# Patient Record
Sex: Female | Born: 1960
Health system: Southern US, Community
[De-identification: ages and names within clinical notes are randomized; demographics above are authoritative.]

## PROBLEM LIST (undated history)

## (undated) DIAGNOSIS — K760 Fatty (change of) liver, not elsewhere classified: Secondary | ICD-10-CM

## (undated) DIAGNOSIS — E669 Obesity, unspecified: Secondary | ICD-10-CM

## (undated) DIAGNOSIS — Z659 Problem related to unspecified psychosocial circumstances: Secondary | ICD-10-CM

## (undated) DIAGNOSIS — D72829 Elevated white blood cell count, unspecified: Secondary | ICD-10-CM

## (undated) DIAGNOSIS — I480 Paroxysmal atrial fibrillation: Secondary | ICD-10-CM

## (undated) DIAGNOSIS — J45909 Unspecified asthma, uncomplicated: Secondary | ICD-10-CM

## (undated) DIAGNOSIS — E876 Hypokalemia: Secondary | ICD-10-CM

## (undated) DIAGNOSIS — M1990 Unspecified osteoarthritis, unspecified site: Secondary | ICD-10-CM

## (undated) DIAGNOSIS — I10 Essential (primary) hypertension: Secondary | ICD-10-CM

## (undated) DIAGNOSIS — D573 Sickle-cell trait: Secondary | ICD-10-CM

## (undated) HISTORY — DX: Obesity, unspecified: E66.9

## (undated) HISTORY — DX: Sickle-cell trait: D57.3

## (undated) HISTORY — DX: Problem related to unspecified psychosocial circumstances: Z65.9

## (undated) HISTORY — DX: Unspecified asthma, uncomplicated: J45.909

## (undated) HISTORY — DX: Elevated white blood cell count, unspecified: D72.829

## (undated) HISTORY — PX: HERNIA REPAIR: SHX51

## (undated) HISTORY — DX: Paroxysmal atrial fibrillation: I48.0

---

## 2001-11-04 ENCOUNTER — Emergency Department (HOSPITAL_COMMUNITY): Admission: EM | Admit: 2001-11-04 | Discharge: 2001-11-04 | Payer: Self-pay | Admitting: Emergency Medicine

## 2002-04-06 ENCOUNTER — Emergency Department (HOSPITAL_COMMUNITY): Admission: EM | Admit: 2002-04-06 | Discharge: 2002-04-06 | Payer: Self-pay | Admitting: Emergency Medicine

## 2003-01-25 ENCOUNTER — Emergency Department (HOSPITAL_COMMUNITY): Admission: EM | Admit: 2003-01-25 | Discharge: 2003-01-25 | Payer: Self-pay | Admitting: Emergency Medicine

## 2003-01-25 ENCOUNTER — Encounter: Payer: Self-pay | Admitting: Emergency Medicine

## 2003-03-22 ENCOUNTER — Emergency Department (HOSPITAL_COMMUNITY): Admission: EM | Admit: 2003-03-22 | Discharge: 2003-03-23 | Payer: Self-pay | Admitting: Emergency Medicine

## 2003-03-23 ENCOUNTER — Encounter: Payer: Self-pay | Admitting: Emergency Medicine

## 2003-04-15 ENCOUNTER — Ambulatory Visit (HOSPITAL_COMMUNITY): Admission: RE | Admit: 2003-04-15 | Discharge: 2003-04-15 | Payer: Self-pay | Admitting: Family Medicine

## 2003-04-15 ENCOUNTER — Encounter: Payer: Self-pay | Admitting: Family Medicine

## 2003-05-04 ENCOUNTER — Emergency Department (HOSPITAL_COMMUNITY): Admission: EM | Admit: 2003-05-04 | Discharge: 2003-05-04 | Payer: Self-pay | Admitting: Emergency Medicine

## 2003-06-12 ENCOUNTER — Emergency Department (HOSPITAL_COMMUNITY): Admission: EM | Admit: 2003-06-12 | Discharge: 2003-06-13 | Payer: Self-pay | Admitting: Emergency Medicine

## 2003-06-12 ENCOUNTER — Emergency Department (HOSPITAL_COMMUNITY): Admission: EM | Admit: 2003-06-12 | Discharge: 2003-06-12 | Payer: Self-pay | Admitting: Emergency Medicine

## 2003-06-13 ENCOUNTER — Encounter: Payer: Self-pay | Admitting: Emergency Medicine

## 2004-10-01 ENCOUNTER — Emergency Department (HOSPITAL_COMMUNITY): Admission: EM | Admit: 2004-10-01 | Discharge: 2004-10-02 | Payer: Self-pay | Admitting: Emergency Medicine

## 2004-12-04 ENCOUNTER — Ambulatory Visit: Payer: Self-pay | Admitting: Family Medicine

## 2005-04-17 ENCOUNTER — Ambulatory Visit: Payer: Self-pay | Admitting: Internal Medicine

## 2005-04-19 ENCOUNTER — Emergency Department (HOSPITAL_COMMUNITY): Admission: EM | Admit: 2005-04-19 | Discharge: 2005-04-19 | Payer: Self-pay | Admitting: *Deleted

## 2005-05-05 ENCOUNTER — Emergency Department (HOSPITAL_COMMUNITY): Admission: EM | Admit: 2005-05-05 | Discharge: 2005-05-05 | Payer: Self-pay | Admitting: Emergency Medicine

## 2005-05-07 ENCOUNTER — Ambulatory Visit: Payer: Self-pay | Admitting: Family Medicine

## 2005-07-07 ENCOUNTER — Emergency Department (HOSPITAL_COMMUNITY): Admission: EM | Admit: 2005-07-07 | Discharge: 2005-07-07 | Payer: Self-pay | Admitting: Emergency Medicine

## 2005-09-12 ENCOUNTER — Emergency Department (HOSPITAL_COMMUNITY): Admission: EM | Admit: 2005-09-12 | Discharge: 2005-09-13 | Payer: Self-pay | Admitting: Emergency Medicine

## 2006-01-08 ENCOUNTER — Emergency Department (HOSPITAL_COMMUNITY): Admission: EM | Admit: 2006-01-08 | Discharge: 2006-01-09 | Payer: Self-pay | Admitting: Emergency Medicine

## 2006-05-02 ENCOUNTER — Emergency Department (HOSPITAL_COMMUNITY): Admission: EM | Admit: 2006-05-02 | Discharge: 2006-05-02 | Payer: Self-pay | Admitting: Emergency Medicine

## 2006-11-25 ENCOUNTER — Emergency Department (HOSPITAL_COMMUNITY): Admission: EM | Admit: 2006-11-25 | Discharge: 2006-11-25 | Payer: Self-pay | Admitting: Emergency Medicine

## 2007-01-23 ENCOUNTER — Emergency Department (HOSPITAL_COMMUNITY): Admission: EM | Admit: 2007-01-23 | Discharge: 2007-01-23 | Payer: Self-pay | Admitting: Emergency Medicine

## 2007-01-24 ENCOUNTER — Ambulatory Visit: Payer: Self-pay | Admitting: Cardiology

## 2007-01-30 ENCOUNTER — Encounter: Payer: Self-pay | Admitting: Cardiology

## 2007-03-27 ENCOUNTER — Emergency Department (HOSPITAL_COMMUNITY): Admission: EM | Admit: 2007-03-27 | Discharge: 2007-03-27 | Payer: Self-pay | Admitting: Emergency Medicine

## 2007-04-19 ENCOUNTER — Emergency Department (HOSPITAL_COMMUNITY): Admission: EM | Admit: 2007-04-19 | Discharge: 2007-04-19 | Payer: Self-pay | Admitting: Emergency Medicine

## 2007-06-12 ENCOUNTER — Ambulatory Visit: Payer: Self-pay | Admitting: Cardiology

## 2007-06-12 ENCOUNTER — Encounter: Payer: Self-pay | Admitting: Cardiology

## 2007-06-25 ENCOUNTER — Ambulatory Visit: Payer: Self-pay | Admitting: Cardiology

## 2007-07-17 ENCOUNTER — Ambulatory Visit (HOSPITAL_COMMUNITY): Admission: RE | Admit: 2007-07-17 | Discharge: 2007-07-18 | Payer: Self-pay | Admitting: General Surgery

## 2007-07-17 ENCOUNTER — Encounter (HOSPITAL_BASED_OUTPATIENT_CLINIC_OR_DEPARTMENT_OTHER): Payer: Self-pay | Admitting: General Surgery

## 2007-07-18 ENCOUNTER — Emergency Department (HOSPITAL_COMMUNITY): Admission: EM | Admit: 2007-07-18 | Discharge: 2007-07-18 | Payer: Self-pay | Admitting: Emergency Medicine

## 2008-02-18 ENCOUNTER — Emergency Department (HOSPITAL_COMMUNITY): Admission: EM | Admit: 2008-02-18 | Discharge: 2008-02-18 | Payer: Self-pay | Admitting: Emergency Medicine

## 2009-11-02 ENCOUNTER — Emergency Department (HOSPITAL_COMMUNITY): Admission: EM | Admit: 2009-11-02 | Discharge: 2009-11-02 | Payer: Self-pay | Admitting: Emergency Medicine

## 2010-03-09 ENCOUNTER — Emergency Department (HOSPITAL_COMMUNITY): Admission: EM | Admit: 2010-03-09 | Discharge: 2010-03-09 | Payer: Self-pay | Admitting: Family Medicine

## 2010-07-21 ENCOUNTER — Emergency Department (HOSPITAL_COMMUNITY): Admission: EM | Admit: 2010-07-21 | Discharge: 2010-07-21 | Payer: Self-pay | Admitting: Emergency Medicine

## 2010-07-31 ENCOUNTER — Emergency Department (HOSPITAL_COMMUNITY)
Admission: EM | Admit: 2010-07-31 | Discharge: 2010-07-31 | Payer: Self-pay | Source: Home / Self Care | Admitting: Emergency Medicine

## 2010-09-22 ENCOUNTER — Emergency Department (HOSPITAL_COMMUNITY)
Admission: EM | Admit: 2010-09-22 | Discharge: 2010-09-22 | Payer: Self-pay | Source: Home / Self Care | Admitting: Emergency Medicine

## 2010-09-22 LAB — URINALYSIS, ROUTINE W REFLEX MICROSCOPIC
Bilirubin Urine: NEGATIVE
Specific Gravity, Urine: 1.01 (ref 1.005–1.030)
Urine Glucose, Fasting: NEGATIVE mg/dL
Urobilinogen, UA: 0.2 mg/dL (ref 0.0–1.0)
pH: 5.5 (ref 5.0–8.0)

## 2010-09-22 LAB — URINE MICROSCOPIC-ADD ON

## 2010-09-22 LAB — WET PREP, GENITAL: Yeast Wet Prep HPF POC: NONE SEEN

## 2010-09-23 LAB — GC/CHLAMYDIA PROBE AMP, GENITAL
Chlamydia, DNA Probe: NEGATIVE
GC Probe Amp, Genital: NEGATIVE

## 2010-09-24 LAB — URINE CULTURE: Culture: NO GROWTH

## 2010-11-06 LAB — DIFFERENTIAL
Basophils Absolute: 0 10*3/uL (ref 0.0–0.1)
Basophils Relative: 0 % (ref 0–1)
Eosinophils Relative: 2 % (ref 0–5)
Lymphocytes Relative: 27 % (ref 12–46)

## 2010-11-06 LAB — CBC
HCT: 36.9 % (ref 36.0–46.0)
Platelets: 331 10*3/uL (ref 150–400)
WBC: 11.8 10*3/uL — ABNORMAL HIGH (ref 4.0–10.5)

## 2010-11-06 LAB — POCT I-STAT, CHEM 8
Chloride: 106 mEq/L (ref 96–112)
Glucose, Bld: 122 mg/dL — ABNORMAL HIGH (ref 70–99)
Sodium: 142 mEq/L (ref 135–145)

## 2010-11-07 LAB — BASIC METABOLIC PANEL
BUN: 8 mg/dL (ref 6–23)
CO2: 25 mEq/L (ref 19–32)
Calcium: 8.5 mg/dL (ref 8.4–10.5)
Creatinine, Ser: 0.68 mg/dL (ref 0.4–1.2)
GFR calc non Af Amer: >60 mL/min (ref >60)

## 2010-11-07 LAB — DIFFERENTIAL
Basophils Absolute: 0 10*3/uL (ref 0.0–0.1)
Basophils Relative: 0 % (ref 0–1)
Lymphs Abs: 3.2 10*3/uL (ref 0.7–4.0)
Monocytes Absolute: 0.7 10*3/uL (ref 0.1–1.0)
Monocytes Relative: 6 % (ref 3–12)

## 2010-11-07 LAB — POCT I-STAT, CHEM 8
BUN: 9 mg/dL (ref 6–23)
Creatinine, Ser: 0.7 mg/dL (ref 0.4–1.2)
Glucose, Bld: 162 mg/dL — ABNORMAL HIGH (ref 70–99)
Potassium: 3.8 mEq/L (ref 3.5–5.1)
Sodium: 140 mEq/L (ref 135–145)

## 2010-11-07 LAB — CBC
Hemoglobin: 12.4 g/dL (ref 12.0–15.0)
MCH: 28.6 pg (ref 26.0–34.0)
MCV: 84.3 fL (ref 78.0–100.0)
RDW: 13.3 % (ref 11.5–15.5)
WBC: 11 10*3/uL — ABNORMAL HIGH (ref 4.0–10.5)

## 2010-11-12 LAB — POCT URINALYSIS DIP (DEVICE)
Bilirubin Urine: NEGATIVE
Glucose, UA: NEGATIVE mg/dL
Hgb urine dipstick: NEGATIVE
Specific Gravity, Urine: 1.015 (ref 1.005–1.030)
pH: 5.5 (ref 5.0–8.0)

## 2010-11-12 LAB — POCT I-STAT, CHEM 8
BUN: 7 mg/dL (ref 6–23)
Calcium, Ion: 1.17 mmol/L (ref 1.12–1.32)
Chloride: 106 mEq/L (ref 96–112)
Creatinine, Ser: 0.7 mg/dL (ref 0.4–1.2)
Glucose, Bld: 135 mg/dL — ABNORMAL HIGH (ref 70–99)
Potassium: 3.9 mEq/L (ref 3.5–5.1)

## 2010-11-19 LAB — COMPREHENSIVE METABOLIC PANEL
ALT: 15 U/L (ref 0–35)
BUN: 6 mg/dL (ref 6–23)
CO2: 22 mEq/L (ref 19–32)
Calcium: 8.7 mg/dL (ref 8.4–10.5)
Creatinine, Ser: 0.62 mg/dL (ref 0.4–1.2)
GFR calc non Af Amer: >60 mL/min (ref >60)
Glucose, Bld: 95 mg/dL (ref 70–99)
Sodium: 139 mEq/L (ref 135–145)

## 2010-11-19 LAB — CBC
HCT: 39 % (ref 36.0–46.0)
Hemoglobin: 13.2 g/dL (ref 12.0–15.0)
MCHC: 33.9 g/dL (ref 30.0–36.0)
MCV: 90.2 fL (ref 78.0–100.0)
RBC: 4.33 MIL/uL (ref 3.87–5.11)

## 2010-11-19 LAB — POCT CARDIAC MARKERS
CKMB, poc: <1 ng/mL — ABNORMAL LOW (ref 1.0–8.0)
Troponin i, poc: <0.05 ng/mL (ref 0.00–0.09)

## 2010-11-19 LAB — URINALYSIS, ROUTINE W REFLEX MICROSCOPIC
Glucose, UA: NEGATIVE mg/dL
Protein, ur: NEGATIVE mg/dL
Urobilinogen, UA: 1 mg/dL (ref 0.0–1.0)

## 2010-11-19 LAB — DIFFERENTIAL
Eosinophils Absolute: 0.2 10*3/uL (ref 0.0–0.7)
Lymphs Abs: 3.3 10*3/uL (ref 0.7–4.0)
Neutro Abs: 8.1 10*3/uL — ABNORMAL HIGH (ref 1.7–7.7)
Neutrophils Relative %: 65 % (ref 43–77)

## 2010-11-19 LAB — URINE CULTURE

## 2010-11-19 LAB — URINE MICROSCOPIC-ADD ON

## 2010-12-06 ENCOUNTER — Emergency Department (HOSPITAL_COMMUNITY)
Admission: EM | Admit: 2010-12-06 | Discharge: 2010-12-07 | Disposition: A | Discharge status: Home / Self Care | Payer: Self-pay | Attending: Emergency Medicine | Admitting: Emergency Medicine

## 2010-12-06 DIAGNOSIS — K219 Gastro-esophageal reflux disease without esophagitis: Secondary | ICD-10-CM | POA: Insufficient documentation

## 2010-12-06 DIAGNOSIS — R42 Dizziness and giddiness: Secondary | ICD-10-CM | POA: Insufficient documentation

## 2010-12-06 DIAGNOSIS — E119 Type 2 diabetes mellitus without complications: Secondary | ICD-10-CM | POA: Insufficient documentation

## 2010-12-06 DIAGNOSIS — G40909 Epilepsy, unspecified, not intractable, without status epilepticus: Secondary | ICD-10-CM | POA: Insufficient documentation

## 2010-12-06 DIAGNOSIS — R079 Chest pain, unspecified: Secondary | ICD-10-CM | POA: Insufficient documentation

## 2010-12-06 LAB — CBC
MCH: 28.9 pg (ref 26.0–34.0)
MCV: 82.9 fL (ref 78.0–100.0)
Platelets: 365 10*3/uL (ref 150–400)
RBC: 4.57 MIL/uL (ref 3.87–5.11)
RDW: 13.5 % (ref 11.5–15.5)

## 2010-12-06 LAB — POCT I-STAT, CHEM 8
BUN: 7 mg/dL (ref 6–23)
Calcium, Ion: 1.07 mmol/L — ABNORMAL LOW (ref 1.12–1.32)
Chloride: 104 mEq/L (ref 96–112)
Creatinine, Ser: 0.6 mg/dL (ref 0.4–1.2)
Glucose, Bld: 174 mg/dL — ABNORMAL HIGH (ref 70–99)

## 2010-12-06 LAB — DIFFERENTIAL
Eosinophils Absolute: 0.3 10*3/uL (ref 0.0–0.7)
Eosinophils Relative: 3 % (ref 0–5)
Lymphs Abs: 3.8 10*3/uL (ref 0.7–4.0)
Monocytes Relative: 6 % (ref 3–12)

## 2010-12-07 LAB — URINALYSIS, ROUTINE W REFLEX MICROSCOPIC
Leukocytes, UA: NEGATIVE
Specific Gravity, Urine: 1.011 (ref 1.005–1.030)
Urobilinogen, UA: 1 mg/dL (ref 0.0–1.0)

## 2010-12-07 LAB — POCT CARDIAC MARKERS: Troponin i, poc: <0.05 ng/mL (ref 0.00–0.09)

## 2010-12-07 LAB — URINE MICROSCOPIC-ADD ON

## 2011-01-09 NOTE — Assessment & Plan Note (Signed)
[facility]                            CARDIOLOGY OFFICE NOTE   [name], [name]                      MRN:          [id number]  DATE:01/24/2007                            DOB:          Jun 19, 1961    REFERRING PHYSICIAN:  Leonie Man, M.D.   I was asked by Dr. Dominga Ferry to evaluate and consult on Anna Golden  for a history of atrial flutter and presurgical clearance for a  ventral hernia repair.   HISTORY OF PRESENT ILLNESS:  [name] is 50 years of age, married and  has 3 children. She moved here from [place] about 6 years ago. She  has been seen off and on in the emergency room there and here for some  palpitations. They usually occur spontaneously when she is sitting or  lying down. She does note that her heart speeds up when she walks or  exerts herself.   She has never been told she has had atrial flutter or fib. She has never  been told she has SVT.   She states she was seen in the emergency room at [facility] this year  but I could not find it. She was seen there twice for unrelated issues.   She gets a little light headed when she has the palpitations. She has  not had a fainting spell. She denies any ischemic symptoms.   PAST MEDICAL HISTORY:  She is a new onset diabetic.   MEDICATIONS:  1. Metformin 750 mg a day.  2. Omeprazole 20 mg a day.  3. Arthrotec 75 mg p.r.n.  4. Albuterol inhaler p.r.n. for chronic bronchitis and tobacco use.  5. Flagyl 500 mg a day at present.   She smokes about a half pack a cigarettes and has for 21 years. She is  trying to quit. She does not use recreational drugs. She does not use  alcohol. She drinks no caffeine.   PAST SURGICAL HISTORY:  Negative.   FAMILY HISTORY:  Negative for premature coronary disease. There is a  history of hypertension and diabetes.   SOCIAL HISTORY:  She is unemployed and married and has 3 children.   REVIEW OF SYSTEMS:  She has chronic allergies  and she had childhood  asthma. She has  chronic bronchitis now from cigarette use. She has  sickle cell trait and some chronic anemia. She has occasional  constipation. She is tired all the time. She has a ventral hernia that  needs repair by Dr. Lurene Shadow. She has gastroesophageal reflux. She has  chronic arthritis. She has suffered from depression in the past.   The rest of the review of systems are negative.   PHYSICAL EXAMINATION:  VITAL SIGNS:  Her blood pressure is 133/84, pulse  was 102 in sinus tach. She came down to 80 once she settled down.  VITAL SIGNS:  Her height is 5 foot 3.5, she weighs 234 pounds.  HEENT:  Normocephalic, atraumatic. PERRLA. Extraocular movements intact.  Sclera muddy. Facial symmetry is normal. Dentition satisfactory.  NECK:  Supple. Carotids are full without bruits. There is no JVD.  Thyroid  is nonpalpable.  LUNGS:  Clear except for expiratory rhonchi.  HEART:  PMI is poorly appreciated. She has pendulous breasts. She has  normal S1 and S2 without gallop.  ABDOMEN:  Protuberant with good bowel sounds. There is no obvious  organomegaly but it was difficult to assess with her weight. There was  no midline bruit.  EXTREMITIES:  Reveal no edema. Pulses are brisk.  NEUROLOGIC:  Intact.  SKIN:  Intact.   EKG is normal.   ASSESSMENT:  1. History of tachy palpitations with a sensation fluttering. I do not      feel she has atrial flutter by history.  2. Obesity with new onset diabetes.  3. Tobacco use.   I had a long talk with [name] by therapeutic lifestyle changes  including weight reduction and discontinued tobacco. I told her the  albuterol could exacerbate her palpitations. Will check a 2-D  echocardiogram to rule out any structural heart disease. If this is  normal, we will clear her for surgery. I will see her back on a p.r.n.  basis.     Thomas C. Daleen Squibb, MD, [facility]  Electronically Signed    TCW/MedQ  DD: 01/24/2007  DT: 01/24/2007  Job  #: [id number]   cc:   Lorelle Formosa, M.D.  Leonie Man, M.D.

## 2011-01-09 NOTE — Op Note (Signed)
[name], [name]               ACCOUNT NO.:  [id number]   MEDICAL RECORD NO.:  [id number]          PATIENT TYPE:  OIB   LOCATION:  1525                         FACILITY:  [facility]   PHYSICIAN:  Leonie Man, M.D.   DATE OF BIRTH:  August 09, 1961   DATE OF PROCEDURE:  07/17/2007  DATE OF DISCHARGE:                               OPERATIVE REPORT   PREOPERATIVE DIAGNOSIS:  Incarcerated ventral hernia.   POSTOPERATIVE DIAGNOSIS:  Incarcerated ventral hernia.   PROCEDURE:  Repair of incarcerated ventral hernia with mesh (Kugel  patch).   SURGEON:  Leonie Man, M.D.   ASSISTANT:  OR tech.   ANESTHESIA:  General.   FINDINGS AT OPERATION:  Incarcerated ventral hernia containing omentum.   SPECIMEN:  Hernia sac with omentum.   ESTIMATED BLOOD LOSS:  Minimal.   COMPLICATIONS:  None.   The patient returned to the PACU in excellent condition.   Mrs. Anna Golden is a 50 year old woman status post cesarean section  and has a large incarcerated ventral hernia in the infraumbilical region  extending almost all the way down to the pubis.  This has been causing  her some discomfort although it does not contain any bowel, but it seems  to contain omentum.  The patient comes to the operating room now after  the risks and potential benefits of surgery have been fully discussed,  all questions answered and consent obtained.   PROCEDURE:  The patient was positioned supinely and following the  induction of satisfactory general anesthesia, the abdomen is prepped and  draped to be included in a sterile operative field.  An infraumbilical  incision is carried down through skin and subcutaneous tissues in the  midline and deepened to the hernia.  The hernia sac is dissected free  from the surrounding soft tissues and all sides with dissection carried  down to the fascia.  The hernia sac is then opened carefully and  released from the fascia, and the omentum that his incarcerated within  the  hernia sac is divided with the LigaSure, and the hernia sac and  omentum are removed and forwarded for pathologic evaluation.  The edges  of the hernia sac are then freed from adhesions in a wide circle so as  to facilitate the placement of a Kugel inlay patch.  The patch is placed  in and secured with multiple #1 Novofil sutures to the fascia.  This was  accomplished only after some undermining of the fascia could be carried  out so as to place the sutures widely around the Kugel patch.  This  having been accomplished, the hernia repair, the fascia over the Kugel  patch was then closed with interrupted sutures of #1 Novofil.  Subcutaneous tissues were then thoroughly irrigated and then closed  with interrupted 2-0 Vicryl sutures.  The skin was closed with a running  4-0 Monocryl suture and reinforced with Steri-Strips, sterile dressings  applied, the anesthetic reversed and the patient removed from the  operating room to the recovery room in stable condition.  She tolerated  the procedure well.      Leonie Man,  M.D.  Electronically Signed     PB/MEDQ  D:  07/17/2007  T:  07/17/2007  Job:  [id number]   cc:   [facility]

## 2011-02-14 ENCOUNTER — Ambulatory Visit (INDEPENDENT_AMBULATORY_CARE_PROVIDER_SITE_OTHER): Payer: Self-pay

## 2011-02-14 ENCOUNTER — Inpatient Hospital Stay (INDEPENDENT_AMBULATORY_CARE_PROVIDER_SITE_OTHER)
Admission: RE | Admit: 2011-02-14 | Discharge: 2011-02-14 | Disposition: A | Discharge status: Home / Self Care | Payer: Self-pay | Source: Ambulatory Visit | Attending: Emergency Medicine | Admitting: Emergency Medicine

## 2011-02-14 DIAGNOSIS — R05 Cough: Secondary | ICD-10-CM

## 2011-02-14 DIAGNOSIS — R071 Chest pain on breathing: Secondary | ICD-10-CM

## 2011-02-14 DIAGNOSIS — Z760 Encounter for issue of repeat prescription: Secondary | ICD-10-CM

## 2011-02-17 ENCOUNTER — Emergency Department (HOSPITAL_COMMUNITY)
Admission: EM | Admit: 2011-02-17 | Discharge: 2011-02-17 | Disposition: A | Discharge status: Home / Self Care | Payer: Self-pay | Attending: Emergency Medicine | Admitting: Emergency Medicine

## 2011-02-17 ENCOUNTER — Emergency Department (HOSPITAL_COMMUNITY): Payer: Self-pay

## 2011-02-17 DIAGNOSIS — G40909 Epilepsy, unspecified, not intractable, without status epilepticus: Secondary | ICD-10-CM | POA: Insufficient documentation

## 2011-02-17 DIAGNOSIS — K219 Gastro-esophageal reflux disease without esophagitis: Secondary | ICD-10-CM | POA: Insufficient documentation

## 2011-02-17 DIAGNOSIS — R079 Chest pain, unspecified: Secondary | ICD-10-CM | POA: Insufficient documentation

## 2011-02-17 DIAGNOSIS — R002 Palpitations: Secondary | ICD-10-CM | POA: Insufficient documentation

## 2011-02-17 DIAGNOSIS — M069 Rheumatoid arthritis, unspecified: Secondary | ICD-10-CM | POA: Insufficient documentation

## 2011-02-17 DIAGNOSIS — E119 Type 2 diabetes mellitus without complications: Secondary | ICD-10-CM | POA: Insufficient documentation

## 2011-02-17 DIAGNOSIS — M25519 Pain in unspecified shoulder: Secondary | ICD-10-CM | POA: Insufficient documentation

## 2011-02-17 DIAGNOSIS — M129 Arthropathy, unspecified: Secondary | ICD-10-CM | POA: Insufficient documentation

## 2011-02-17 DIAGNOSIS — M546 Pain in thoracic spine: Secondary | ICD-10-CM | POA: Insufficient documentation

## 2011-02-17 DIAGNOSIS — E669 Obesity, unspecified: Secondary | ICD-10-CM | POA: Insufficient documentation

## 2011-02-17 DIAGNOSIS — Z79899 Other long term (current) drug therapy: Secondary | ICD-10-CM | POA: Insufficient documentation

## 2011-02-17 DIAGNOSIS — R000 Tachycardia, unspecified: Secondary | ICD-10-CM | POA: Insufficient documentation

## 2011-02-17 LAB — POCT I-STAT, CHEM 8
BUN: 7 mg/dL (ref 6–23)
Chloride: 104 mEq/L (ref 96–112)
Creatinine, Ser: 0.8 mg/dL (ref 0.50–1.10)
Glucose, Bld: 173 mg/dL — ABNORMAL HIGH (ref 70–99)
Hemoglobin: 12.2 g/dL (ref 12.0–15.0)
Potassium: 3.4 mEq/L — ABNORMAL LOW (ref 3.5–5.1)
Sodium: 142 mEq/L (ref 135–145)

## 2011-02-17 LAB — CBC
HCT: 34.4 % — ABNORMAL LOW (ref 36.0–46.0)
Hemoglobin: 12.1 g/dL (ref 12.0–15.0)
MCHC: 35.2 g/dL (ref 30.0–36.0)
MCV: 83.1 fL (ref 78.0–100.0)
RDW: 13.5 % (ref 11.5–15.5)

## 2011-02-17 LAB — DIFFERENTIAL
Basophils Absolute: 0 10*3/uL (ref 0.0–0.1)
Eosinophils Relative: 2 % (ref 0–5)
Lymphocytes Relative: 33 % (ref 12–46)
Lymphs Abs: 4.5 10*3/uL — ABNORMAL HIGH (ref 0.7–4.0)
Monocytes Absolute: 0.8 10*3/uL (ref 0.1–1.0)
Neutro Abs: 8.1 10*3/uL — ABNORMAL HIGH (ref 1.7–7.7)

## 2011-02-17 LAB — CK TOTAL AND CKMB (NOT AT ARMC)
CK, MB: 1.3 ng/mL (ref 0.3–4.0)
Relative Index: INVALID (ref 0.0–2.5)

## 2011-03-24 ENCOUNTER — Emergency Department (HOSPITAL_COMMUNITY): Payer: Self-pay

## 2011-03-24 ENCOUNTER — Emergency Department (HOSPITAL_COMMUNITY)
Admission: EM | Admit: 2011-03-24 | Discharge: 2011-03-24 | Disposition: A | Discharge status: Home / Self Care | Payer: Self-pay | Attending: Emergency Medicine | Admitting: Emergency Medicine

## 2011-03-24 DIAGNOSIS — R0789 Other chest pain: Secondary | ICD-10-CM | POA: Insufficient documentation

## 2011-03-24 DIAGNOSIS — R0602 Shortness of breath: Secondary | ICD-10-CM | POA: Insufficient documentation

## 2011-03-24 DIAGNOSIS — G40909 Epilepsy, unspecified, not intractable, without status epilepticus: Secondary | ICD-10-CM | POA: Insufficient documentation

## 2011-03-24 DIAGNOSIS — E876 Hypokalemia: Secondary | ICD-10-CM | POA: Insufficient documentation

## 2011-03-24 DIAGNOSIS — M069 Rheumatoid arthritis, unspecified: Secondary | ICD-10-CM | POA: Insufficient documentation

## 2011-03-24 DIAGNOSIS — E119 Type 2 diabetes mellitus without complications: Secondary | ICD-10-CM | POA: Insufficient documentation

## 2011-03-24 DIAGNOSIS — K219 Gastro-esophageal reflux disease without esophagitis: Secondary | ICD-10-CM | POA: Insufficient documentation

## 2011-03-24 DIAGNOSIS — F411 Generalized anxiety disorder: Secondary | ICD-10-CM | POA: Insufficient documentation

## 2011-03-24 DIAGNOSIS — I4891 Unspecified atrial fibrillation: Secondary | ICD-10-CM | POA: Insufficient documentation

## 2011-03-24 DIAGNOSIS — R0609 Other forms of dyspnea: Secondary | ICD-10-CM | POA: Insufficient documentation

## 2011-03-24 DIAGNOSIS — R0989 Other specified symptoms and signs involving the circulatory and respiratory systems: Secondary | ICD-10-CM | POA: Insufficient documentation

## 2011-03-24 DIAGNOSIS — R000 Tachycardia, unspecified: Secondary | ICD-10-CM | POA: Insufficient documentation

## 2011-03-24 LAB — DIFFERENTIAL
Basophils Absolute: 0 10*3/uL (ref 0.0–0.1)
Basophils Relative: 0 % (ref 0–1)
Lymphocytes Relative: 20 % (ref 12–46)
Monocytes Relative: 5 % (ref 3–12)
Neutro Abs: 8.5 10*3/uL — ABNORMAL HIGH (ref 1.7–7.7)
Neutrophils Relative %: 74 % (ref 43–77)

## 2011-03-24 LAB — BASIC METABOLIC PANEL
Calcium: 9.2 mg/dL (ref 8.4–10.5)
Creatinine, Ser: 0.77 mg/dL (ref 0.50–1.10)
GFR calc Af Amer: >60 mL/min (ref >60)
GFR calc non Af Amer: >60 mL/min (ref >60)
Sodium: 139 mEq/L (ref 135–145)

## 2011-03-24 LAB — PRO B NATRIURETIC PEPTIDE: Pro B Natriuretic peptide (BNP): 10.8 pg/mL (ref 0–125)

## 2011-03-24 LAB — CK TOTAL AND CKMB (NOT AT ARMC)
CK, MB: 2 ng/mL (ref 0.3–4.0)
Total CK: 118 U/L (ref 7–177)

## 2011-03-24 LAB — CBC
HCT: 36.9 % (ref 36.0–46.0)
Hemoglobin: 12.9 g/dL (ref 12.0–15.0)
RBC: 4.48 MIL/uL (ref 3.87–5.11)

## 2011-03-24 LAB — TROPONIN I: Troponin I: <0.3 ng/mL (ref <0.30)

## 2011-05-24 LAB — WET PREP, GENITAL
Clue Cells Wet Prep HPF POC: NONE SEEN
Yeast Wet Prep HPF POC: NONE SEEN

## 2011-05-24 LAB — URINALYSIS, ROUTINE W REFLEX MICROSCOPIC
Glucose, UA: NEGATIVE
Ketones, ur: NEGATIVE
Nitrite: NEGATIVE
Protein, ur: NEGATIVE
Urobilinogen, UA: 1

## 2011-05-24 LAB — URINE MICROSCOPIC-ADD ON

## 2011-05-24 LAB — GC/CHLAMYDIA PROBE AMP, GENITAL: GC Probe Amp, Genital: NEGATIVE

## 2011-06-05 LAB — BASIC METABOLIC PANEL
BUN: 5 — ABNORMAL LOW
Calcium: 8.4
Chloride: 105
Creatinine, Ser: 0.7
GFR calc Af Amer: >60
GFR calc non Af Amer: >60

## 2011-06-05 LAB — URINALYSIS, ROUTINE W REFLEX MICROSCOPIC
Bilirubin Urine: NEGATIVE
Bilirubin Urine: NEGATIVE
Glucose, UA: NEGATIVE
Glucose, UA: NEGATIVE
Hgb urine dipstick: NEGATIVE
Ketones, ur: NEGATIVE
Nitrite: NEGATIVE
Nitrite: NEGATIVE
Protein, ur: NEGATIVE
Protein, ur: NEGATIVE
Specific Gravity, Urine: 1.014
Urobilinogen, UA: 1
pH: 6

## 2011-06-05 LAB — DIFFERENTIAL
Basophils Absolute: 0
Basophils Relative: 0
Eosinophils Absolute: 0.2
Eosinophils Absolute: 0.2
Lymphocytes Relative: 17
Lymphs Abs: 2.2
Monocytes Relative: 5
Neutro Abs: 9.7 — ABNORMAL HIGH
Neutro Abs: 8.1 — ABNORMAL HIGH
Neutrophils Relative %: 76
Neutrophils Relative %: 66

## 2011-06-05 LAB — PROTIME-INR
INR: 1
Prothrombin Time: 13.6

## 2011-06-05 LAB — PREGNANCY, URINE: Preg Test, Ur: NEGATIVE

## 2011-06-05 LAB — CBC
MCHC: 33.9
MCV: 89.5
Platelets: 246
Platelets: 258
RBC: 3.97
WBC: 12.7 — ABNORMAL HIGH
WBC: 12.3 — ABNORMAL HIGH

## 2011-06-05 LAB — POCT PREGNANCY, URINE: Preg Test, Ur: NEGATIVE

## 2011-06-05 LAB — COMPREHENSIVE METABOLIC PANEL
Alkaline Phosphatase: 23 — ABNORMAL LOW
BUN: 6
GFR calc Af Amer: >60
GFR calc non Af Amer: >60
Sodium: 141
Total Bilirubin: 0.8
Total Protein: 7.4

## 2011-06-05 LAB — URINE MICROSCOPIC-ADD ON

## 2011-06-05 LAB — APTT: aPTT: 29

## 2011-06-05 LAB — LIPASE, BLOOD: Lipase: 11

## 2011-06-08 LAB — URINALYSIS, ROUTINE W REFLEX MICROSCOPIC
Glucose, UA: NEGATIVE
Hgb urine dipstick: NEGATIVE
Protein, ur: NEGATIVE
pH: 6.5

## 2011-06-08 LAB — DIFFERENTIAL
Basophils Relative: 0
Eosinophils Absolute: 0.3
Lymphs Abs: 3.3
Monocytes Absolute: 0.3
Monocytes Relative: 3
Neutro Abs: 8.2 — ABNORMAL HIGH
Neutrophils Relative %: 68

## 2011-06-08 LAB — COMPREHENSIVE METABOLIC PANEL
ALT: 15
Albumin: 3.1 — ABNORMAL LOW
Alkaline Phosphatase: 26 — ABNORMAL LOW
Calcium: 8.7
Potassium: 3.6
Sodium: 141
Total Protein: 6.6

## 2011-06-08 LAB — CBC
MCHC: 33.7
Platelets: 294
RDW: 14.2 — ABNORMAL HIGH

## 2011-06-11 LAB — DIFFERENTIAL
Eosinophils Relative: 2
Lymphocytes Relative: 24
Lymphs Abs: 2.9
Monocytes Relative: 4

## 2011-06-11 LAB — CBC
HCT: 41.5
Hemoglobin: 13.7
MCV: 88.2
Platelets: 352
RBC: 4.71
WBC: 12.3 — ABNORMAL HIGH

## 2011-06-11 LAB — BASIC METABOLIC PANEL
BUN: 3 — ABNORMAL LOW
Chloride: 111
GFR calc Af Amer: >60
GFR calc non Af Amer: >60
Potassium: 3.7
Sodium: 141

## 2011-06-11 LAB — URINALYSIS, ROUTINE W REFLEX MICROSCOPIC
Glucose, UA: NEGATIVE
Nitrite: NEGATIVE
Specific Gravity, Urine: 1.014
pH: 5.5

## 2011-10-02 ENCOUNTER — Encounter (HOSPITAL_COMMUNITY): Payer: Self-pay

## 2011-10-02 ENCOUNTER — Other Ambulatory Visit: Payer: Self-pay

## 2011-10-02 ENCOUNTER — Emergency Department (HOSPITAL_COMMUNITY)
Admission: EM | Admit: 2011-10-02 | Discharge: 2011-10-03 | Disposition: A | Discharge status: Home / Self Care | Payer: Self-pay | Attending: Emergency Medicine | Admitting: Emergency Medicine

## 2011-10-02 ENCOUNTER — Emergency Department (HOSPITAL_COMMUNITY): Payer: Self-pay

## 2011-10-02 DIAGNOSIS — M25559 Pain in unspecified hip: Secondary | ICD-10-CM | POA: Insufficient documentation

## 2011-10-02 DIAGNOSIS — M25519 Pain in unspecified shoulder: Secondary | ICD-10-CM | POA: Insufficient documentation

## 2011-10-02 DIAGNOSIS — J189 Pneumonia, unspecified organism: Secondary | ICD-10-CM

## 2011-10-02 DIAGNOSIS — R079 Chest pain, unspecified: Secondary | ICD-10-CM | POA: Insufficient documentation

## 2011-10-02 DIAGNOSIS — M1990 Unspecified osteoarthritis, unspecified site: Secondary | ICD-10-CM | POA: Insufficient documentation

## 2011-10-02 DIAGNOSIS — F17200 Nicotine dependence, unspecified, uncomplicated: Secondary | ICD-10-CM | POA: Insufficient documentation

## 2011-10-02 LAB — COMPREHENSIVE METABOLIC PANEL
ALT: 18 U/L (ref 0–35)
AST: 38 U/L — ABNORMAL HIGH (ref 0–37)
Alkaline Phosphatase: 36 U/L — ABNORMAL LOW (ref 39–117)
CO2: 27 mEq/L (ref 19–32)
Calcium: 10.3 mg/dL (ref 8.4–10.5)
Chloride: 103 mEq/L (ref 96–112)
GFR calc Af Amer: >90 mL/min (ref >90)
GFR calc non Af Amer: >90 mL/min (ref >90)
Glucose, Bld: 233 mg/dL — ABNORMAL HIGH (ref 70–99)
Potassium: 3.5 mEq/L (ref 3.5–5.1)
Sodium: 137 mEq/L (ref 135–145)

## 2011-10-02 LAB — URINALYSIS, ROUTINE W REFLEX MICROSCOPIC
Bilirubin Urine: NEGATIVE
Hgb urine dipstick: NEGATIVE
Ketones, ur: NEGATIVE mg/dL
Nitrite: NEGATIVE
Specific Gravity, Urine: 1.012 (ref 1.005–1.030)
Urobilinogen, UA: 0.2 mg/dL (ref 0.0–1.0)

## 2011-10-02 LAB — DIFFERENTIAL
Basophils Absolute: 0.1 10*3/uL (ref 0.0–0.1)
Lymphocytes Relative: 32 % (ref 12–46)
Lymphs Abs: 4.5 10*3/uL — ABNORMAL HIGH (ref 0.7–4.0)
Neutro Abs: 8.3 10*3/uL — ABNORMAL HIGH (ref 1.7–7.7)
Neutrophils Relative %: 60 % (ref 43–77)

## 2011-10-02 LAB — APTT: aPTT: 26 seconds (ref 24–37)

## 2011-10-02 LAB — PROTIME-INR: Prothrombin Time: 13.7 seconds (ref 11.6–15.2)

## 2011-10-02 LAB — CARDIAC PANEL(CRET KIN+CKTOT+MB+TROPI)
CK, MB: 1.5 ng/mL (ref 0.3–4.0)
Troponin I: <0.3 ng/mL (ref <0.30)

## 2011-10-02 LAB — CBC
MCV: 83.8 fL (ref 78.0–100.0)
Platelets: 339 10*3/uL (ref 150–400)
RBC: 4.37 MIL/uL (ref 3.87–5.11)
RDW: 13.7 % (ref 11.5–15.5)
WBC: 14 10*3/uL — ABNORMAL HIGH (ref 4.0–10.5)

## 2011-10-02 LAB — MAGNESIUM: Magnesium: 1.8 mg/dL (ref 1.5–2.5)

## 2011-10-02 LAB — D-DIMER, QUANTITATIVE: D-Dimer, Quant: 0.49 ug/mL-FEU — ABNORMAL HIGH (ref 0.00–0.48)

## 2011-10-02 MED ORDER — MOXIFLOXACIN HCL IN NACL 400 MG/250ML IV SOLN
400.0000 mg | Freq: Once | INTRAVENOUS | Status: AC
  Administered 2011-10-02: 400 mg via INTRAVENOUS
  Filled 2011-10-02: qty 250

## 2011-10-02 MED ORDER — IOHEXOL 350 MG/ML SOLN
100.0000 mL | Freq: Once | INTRAVENOUS | Status: AC | PRN
  Administered 2011-10-02: 100 mL via INTRAVENOUS

## 2011-10-02 MED ORDER — SODIUM CHLORIDE 0.9 % IV BOLUS (SEPSIS)
500.0000 mL | Freq: Once | INTRAVENOUS | Status: AC
  Administered 2011-10-02: 500 mL via INTRAVENOUS

## 2011-10-02 NOTE — ED Notes (Signed)
Pt. Given sandwich bag and beverage.

## 2011-10-02 NOTE — ED Notes (Signed)
Encouraged pt to void.

## 2011-10-02 NOTE — ED Notes (Signed)
Pt. In room resting comfortably, c/o occasional fluttering in chest.

## 2011-10-02 NOTE — ED Provider Notes (Signed)
History     CSN: [id number]  Arrival date & time 10/02/11  1608   First MD Initiated Contact with Patient 10/02/11 1821      Chief Complaint  Patient presents with  . Chest Pain    (Consider location/radiation/quality/duration/timing/severity/associated sxs/prior treatment) Patient is a 51 y.o. female presenting with chest pain. The history is provided by the patient.  Chest Pain The chest pain began 1 - 2 weeks ago. Chest pain occurs intermittently. The chest pain is resolved. The quality of the pain is described as sharp. Pertinent negatives for primary symptoms include no fever, no shortness of breath, no cough, no wheezing, no palpitations, no abdominal pain, no nausea, no vomiting and no dizziness.  Associated symptoms include numbness.  Pertinent negatives for associated symptoms include no weakness.   Pt has history of poorly controlled DM. Has been out of meds for some time. Does not have PCP. She c/o L shoulder and L chest pain worsening over last week. Worse when pt ranges her L shoulder. Has been diagnosed with osteoarthritis in the past. Also states she has L hip pain with ROM. No trauma. She has bl hand numbness present for extended period. She is currently having no chest pain, SOB, fever, chill, weakness.   History reviewed. No pertinent past medical history.  History reviewed. No pertinent past surgical history.  No family history on file.  History  Substance Use Topics  . Smoking status: Current Everyday Smoker  . Smokeless tobacco: Not on file  . Alcohol Use: No    OB History    Grav Para Term Preterm Abortions TAB SAB Ect Mult Living                  Review of Systems  Constitutional: Negative for fever and chills.  HENT: Negative for neck pain.   Respiratory: Negative for cough, shortness of breath and wheezing.   Cardiovascular: Positive for chest pain. Negative for palpitations and leg swelling.  Gastrointestinal: Negative for nausea, vomiting and  abdominal pain.  Musculoskeletal: Positive for arthralgias.  Skin: Negative for color change, pallor and rash.  Neurological: Positive for numbness. Negative for dizziness, weakness and light-headedness.    Allergies  Review of patient's allergies indicates no known allergies.  Home Medications   Current Outpatient Rx  Name Route Sig Dispense Refill  . ACETAMINOPHEN ER 650 MG PO TBCR Oral Take 650-1,300 mg by mouth 2 (two) times daily.     . AZITHROMYCIN 250 MG PO TABS Oral Take 1 tablet (250 mg total) by mouth daily. Take first 2 tablets together, then 1 every day until finished. 6 tablet 0  . TRAMADOL HCL 50 MG PO TABS Oral Take 1 tablet (50 mg total) by mouth every 6 (six) hours as needed for pain. 15 tablet 0    BP 128/82  Pulse 99  Temp(Src) 99 F (37.2 C) (Oral)  Resp 20  SpO2 97%  LMP 08/29/2011  Physical Exam  Nursing note and vitals reviewed. Constitutional: She is oriented to person, place, and time. She appears well-developed and well-nourished. No distress.  HENT:  Head: Normocephalic and atraumatic.  Mouth/Throat: Oropharynx is clear and moist.  Eyes: EOM are normal. Pupils are equal, round, and reactive to light.  Neck: Normal range of motion. Neck supple.  Cardiovascular: Normal rate and regular rhythm.   Pulmonary/Chest: Effort normal and breath sounds normal. No respiratory distress. She has no wheezes. She has no rales.  Abdominal: Soft. Bowel sounds are normal. She exhibits  no distension. There is no tenderness. There is no rebound and no guarding.  Musculoskeletal: She exhibits no edema and no tenderness.       Pt with reproducible pain with ROM of L shoulder and L hip. No deformity or swelling.   Neurological: She is alert and oriented to person, place, and time.       5/5 motor, decreased sensation bl hands.   Skin: Skin is warm and dry. No rash noted. No erythema.  Psychiatric: She has a normal mood and affect. Her behavior is normal.    ED Course    Procedures (including critical care time)  Labs Reviewed  CBC - Abnormal; Notable for the following:    WBC 14.0 (*)    All other components within normal limits  DIFFERENTIAL - Abnormal; Notable for the following:    Neutro Abs 8.3 (*)    Lymphs Abs 4.5 (*)    All other components within normal limits  COMPREHENSIVE METABOLIC PANEL - Abnormal; Notable for the following:    Glucose, Bld 233 (*)    Albumin 3.1 (*)    AST 38 (*)    Alkaline Phosphatase 36 (*)    Total Bilirubin 0.2 (*)    All other components within normal limits  URINALYSIS, ROUTINE W REFLEX MICROSCOPIC - Abnormal; Notable for the following:    APPearance CLOUDY (*)    All other components within normal limits  D-DIMER, QUANTITATIVE - Abnormal; Notable for the following:    D-Dimer, Quant 0.49 (*)    All other components within normal limits  CARDIAC PANEL(CRET KIN+CKTOT+MB+TROPI)  MAGNESIUM  PROTIME-INR  APTT  POCT PREGNANCY, URINE  POCT I-STAT TROPONIN I  CULTURE, BLOOD (ROUTINE X 2)  CULTURE, BLOOD (ROUTINE X 2)   Dg Chest 2 View  10/02/2011  *RADIOLOGY REPORT*  Clinical Data: Chest pain  CHEST - 2 VIEW  Comparison: 03/24/2011  Findings: Spurring in the lower thoracic spine. Lungs clear.  Heart size and pulmonary vascularity normal.  No effusion.  IMPRESSION: No acute disease  Original Report Authenticated By: Osa Craver, M.D.   Ct Angio Chest W/cm &/or Wo Cm  10/02/2011  *RADIOLOGY REPORT*  Clinical Data: Chest pain  CT ANGIOGRAPHY CHEST  Technique:  Multidetector CT imaging of the chest using the standard protocol during bolus administration of intravenous contrast. Multiplanar reconstructed images including MIPs were obtained and reviewed to evaluate the vascular anatomy.  Contrast:  Comparison: 10/02/2011 radiograph, 10/01/2004 CT  Findings: The no pulmonary arterial branch filling defect identified.  Normal heart size.  Normal caliber aorta.  No pleural or pericardial effusion.  No intrathoracic  lymphadenopathy.  Limited images through the upper abdomen show no acute abnormality.  Central airways are patent.  Bilateral patchy ground-glass opacities, upper lobe predominant.  No pneumothorax.  Multilevel degenerative changes of the imaged spine. No acute or aggressive appearing osseous lesion.  IMPRESSION: Bilateral upper lobe patchy ground-glass opacities, favored to represent infection or edema.  No PE identified.  Original Report Authenticated By: Waneta Martins, M.D.     1. Pneumonia       MDM   Atypical Pneumonia likely cause for pt's symptoms. Repeat trop neg x 2. Ct neg for PE. Return for concerns       Loren Racer, MD 10/03/11 [phone]

## 2011-10-02 NOTE — ED Notes (Signed)
Resting quietly in bed. Resp even and unlabored. Awaiting CT. NAD noted

## 2011-10-02 NOTE — ED Notes (Signed)
One week ago. Pt. Having lt. Side chest pain that radiates into her lt. Arm  Having fatique when walking up the stairs.  Pt. Also having dizziness and sob and diahreea.  Pt is in NAD

## 2011-10-03 MED ORDER — AZITHROMYCIN 250 MG PO TABS: 250.0000 mg | ORAL_TABLET | Freq: Every day | ORAL | Status: DC

## 2011-10-03 MED ORDER — TRAMADOL HCL 50 MG PO TABS: 50.0000 mg | ORAL_TABLET | Freq: Four times a day (QID) | ORAL | Status: DC | PRN

## 2011-10-03 NOTE — ED Provider Notes (Signed)
Flow manager notify me if patient having a blood culture with gram-positive cocci in clusters. Review of patient's records from yesterday indicate that she presented with chest pain without cough. She had negative urinalysis and chest x-ray for signs of infection. Patient did have a white count of 14. Result is most likely a contaminant. However flow manager will contact patient and recommend that she return within 24 hours for recheck of her CBC. She is to return sooner if she has worsening symptoms, fever, or hyperglycemia.  Cyndra Numbers, MD 10/03/11 [phone]

## 2011-10-04 ENCOUNTER — Emergency Department (HOSPITAL_COMMUNITY)
Admission: EM | Admit: 2011-10-04 | Discharge: 2011-10-04 | Disposition: A | Discharge status: Home / Self Care | Payer: Self-pay | Attending: Emergency Medicine | Admitting: Emergency Medicine

## 2011-10-04 ENCOUNTER — Encounter (HOSPITAL_COMMUNITY): Payer: Self-pay | Admitting: Emergency Medicine

## 2011-10-04 DIAGNOSIS — J189 Pneumonia, unspecified organism: Secondary | ICD-10-CM

## 2011-10-04 DIAGNOSIS — I2510 Atherosclerotic heart disease of native coronary artery without angina pectoris: Secondary | ICD-10-CM | POA: Insufficient documentation

## 2011-10-04 DIAGNOSIS — E119 Type 2 diabetes mellitus without complications: Secondary | ICD-10-CM | POA: Insufficient documentation

## 2011-10-04 DIAGNOSIS — R079 Chest pain, unspecified: Secondary | ICD-10-CM | POA: Insufficient documentation

## 2011-10-04 DIAGNOSIS — Z79899 Other long term (current) drug therapy: Secondary | ICD-10-CM | POA: Insufficient documentation

## 2011-10-04 DIAGNOSIS — I10 Essential (primary) hypertension: Secondary | ICD-10-CM | POA: Insufficient documentation

## 2011-10-04 DIAGNOSIS — F17200 Nicotine dependence, unspecified, uncomplicated: Secondary | ICD-10-CM | POA: Insufficient documentation

## 2011-10-04 HISTORY — DX: Essential (primary) hypertension: I10

## 2011-10-04 LAB — CBC
Hemoglobin: 13.1 g/dL (ref 12.0–15.0)
MCH: 29.8 pg (ref 26.0–34.0)
MCHC: 35.4 g/dL (ref 30.0–36.0)
MCV: 84.1 fL (ref 78.0–100.0)
RBC: 4.4 MIL/uL (ref 3.87–5.11)

## 2011-10-04 LAB — DIFFERENTIAL
Basophils Absolute: 0 10*3/uL (ref 0.0–0.1)
Lymphs Abs: 3.7 10*3/uL (ref 0.7–4.0)
Monocytes Absolute: 0.7 10*3/uL (ref 0.1–1.0)
Monocytes Relative: 6 % (ref 3–12)

## 2011-10-04 LAB — LACTIC ACID, PLASMA: Lactic Acid, Venous: 2 mmol/L (ref 0.5–2.2)

## 2011-10-04 LAB — POCT I-STAT, CHEM 8
Chloride: 102 mEq/L (ref 96–112)
HCT: 40 % (ref 36.0–46.0)
Hemoglobin: 13.6 g/dL (ref 12.0–15.0)
Potassium: 3.6 mEq/L (ref 3.5–5.1)
Sodium: 140 mEq/L (ref 135–145)

## 2011-10-04 MED ORDER — DOXYCYCLINE HYCLATE 100 MG PO CAPS: 100.0000 mg | ORAL_CAPSULE | Freq: Two times a day (BID) | ORAL | Status: AC

## 2011-10-04 MED ORDER — METFORMIN HCL 500 MG PO TABS: 500.0000 mg | ORAL_TABLET | Freq: Two times a day (BID) | ORAL | Status: DC

## 2011-10-04 NOTE — ED Notes (Signed)
+   Blood. Left hand. Bottle drawn aerobic and anaerobic 5cc each. Gram + cocci in clusters. Reached CBC in 24 hours. If febrile or feeling worse (i.e. Hyperglycemia) come back sooner. Result may be just bacteria from your skin. However, we want to be careful. 10/03/11 11:28 pm Patient called. States she will return tomorrow afternoon for recheck of CBC. Per Joyce Gross RN PFM

## 2011-10-04 NOTE — ED Provider Notes (Signed)
History     CSN: [id number]  Arrival date & time 10/04/11  1528   First MD Initiated Contact with Patient 10/04/11 1611      Chief Complaint  Patient presents with  . Abnormal Lab    (Consider location/radiation/quality/duration/timing/severity/associated sxs/prior treatment) The history is provided by the patient and medical records. No language interpreter was used.   Patient seen on 10/02/11 and treated for atypical pneumonia.  Blood culture obtained on that visit was positive for gram pos cocci in clusters in one sample.  Patient was instructed to return to ED for re-evaluation.  Patients previous symptoms have improved since initial visit. Past Medical History  Diagnosis Date  . Diabetes mellitus   . Hypertension   . Coronary artery disease     History reviewed. No pertinent past surgical history.  History reviewed. No pertinent family history.  History  Substance Use Topics  . Smoking status: Current Everyday Smoker  . Smokeless tobacco: Not on file  . Alcohol Use: No    OB History    Grav Para Term Preterm Abortions TAB SAB Ect Mult Living                  Review of Systems  All other systems reviewed and are negative.    Allergies  Review of patient's allergies indicates no known allergies.  Home Medications   Current Outpatient Rx  Name Route Sig Dispense Refill  . ACETAMINOPHEN ER 650 MG PO TBCR Oral Take 650-1,300 mg by mouth 2 (two) times daily as needed. For pain.    Marland Kitchen AZITHROMYCIN 250 MG PO TABS Oral Take 250-500 mg by mouth daily. 2 tablets the first day, then 1 tablet for 4 days    . TRAMADOL HCL 50 MG PO TABS Oral Take 50 mg by mouth every 6 (six) hours as needed. For pain.      BP 111/77  Pulse 91  Temp(Src) 98 F (36.7 C) (Oral)  Resp 20  SpO2 99%  LMP 08/29/2011  Physical Exam  Nursing note and vitals reviewed. Constitutional: She is oriented to person, place, and time. She appears well-developed and well-nourished.  HENT:  Head:  Normocephalic.  Eyes: Pupils are equal, round, and reactive to light.  Neck: Normal range of motion. Neck supple.  Cardiovascular: Normal rate, regular rhythm, normal heart sounds and intact distal pulses.   Pulmonary/Chest: Effort normal and breath sounds normal.  Abdominal: Soft. Bowel sounds are normal.  Musculoskeletal: Normal range of motion.  Neurological: She is alert and oriented to person, place, and time.  Skin: Skin is warm and dry.  Psychiatric: She has a normal mood and affect.    ED Course  Procedures (including critical care time)  Labs Reviewed  CBC - Abnormal; Notable for the following:    WBC 12.1 (*)    All other components within normal limits  POCT I-STAT, CHEM 8 - Abnormal; Notable for the following:    BUN 5 (*)    Creatinine, Ser 0.40 (*)    Glucose, Bld 244 (*)    All other components within normal limits  DIFFERENTIAL  LACTIC ACID, PLASMA   Dg Chest 2 View  10/02/2011  *RADIOLOGY REPORT*  Clinical Data: Chest pain  CHEST - 2 VIEW  Comparison: 03/24/2011  Findings: Spurring in the lower thoracic spine. Lungs clear.  Heart size and pulmonary vascularity normal.  No effusion.  IMPRESSION: No acute disease  Original Report Authenticated By: Osa Craver, M.D.   Ct  Angio Chest W/cm &/or Wo Cm  10/02/2011  *RADIOLOGY REPORT*  Clinical Data: Chest pain  CT ANGIOGRAPHY CHEST  Technique:  Multidetector CT imaging of the chest using the standard protocol during bolus administration of intravenous contrast. Multiplanar reconstructed images including MIPs were obtained and reviewed to evaluate the vascular anatomy.  Contrast:  Comparison: 10/02/2011 radiograph, 10/01/2004 CT  Findings: The no pulmonary arterial branch filling defect identified.  Normal heart size.  Normal caliber aorta.  No pleural or pericardial effusion.  No intrathoracic lymphadenopathy.  Limited images through the upper abdomen show no acute abnormality.  Central airways are patent.  Bilateral  patchy ground-glass opacities, upper lobe predominant.  No pneumothorax.  Multilevel degenerative changes of the imaged spine. No acute or aggressive appearing osseous lesion.  IMPRESSION: Bilateral upper lobe patchy ground-glass opacities, favored to represent infection or edema.  No PE identified.  Original Report Authenticated By: Waneta Martins, M.D.     No diagnosis found.  6:52 PM Spoke with Dr. Ninetta Lights with infectious disease regarding patient and lab results.  He also feels like blood culture result is d/t contaminant.  As far as antibiotic for the atypical pneumonia, he suggests doxcycline as a cost-effective alternative to azithromycin (patient has not filled prior prescription for abx).  MDM          Jimmye Norman, NP 10/04/11 1900  Jimmye Norman, NP 10/04/11 1905

## 2011-10-04 NOTE — ED Notes (Signed)
Pt here for recheck of CBC and other labs; pt had gram + cocci in blood cultures

## 2011-10-05 LAB — CULTURE, BLOOD (ROUTINE X 2): Culture  Setup Time: 201302060338

## 2011-10-05 NOTE — ED Provider Notes (Signed)
Medical screening examination/treatment/procedure(s) were performed by non-physician practitioner and as supervising physician I was immediately available for consultation/collaboration.    Elinda Bunten L Dessa Ledee, MD 10/05/11 0856 

## 2011-10-09 LAB — CULTURE, BLOOD (ROUTINE X 2): Culture  Setup Time: 201302060338

## 2011-11-24 IMAGING — CR DG HIP COMPLETE 2+V*R*
3 series · 3 of 3 positions shown · non-contrast
Comparison: Lumbar spine radiographs 11/02/2009 and CT abdomen
pelvis 11/25/2006

CLINICAL DATA: Severe right hip pain.

RIGHT HIP - COMPLETE 2+ VIEW

[t pelvis a.p.]
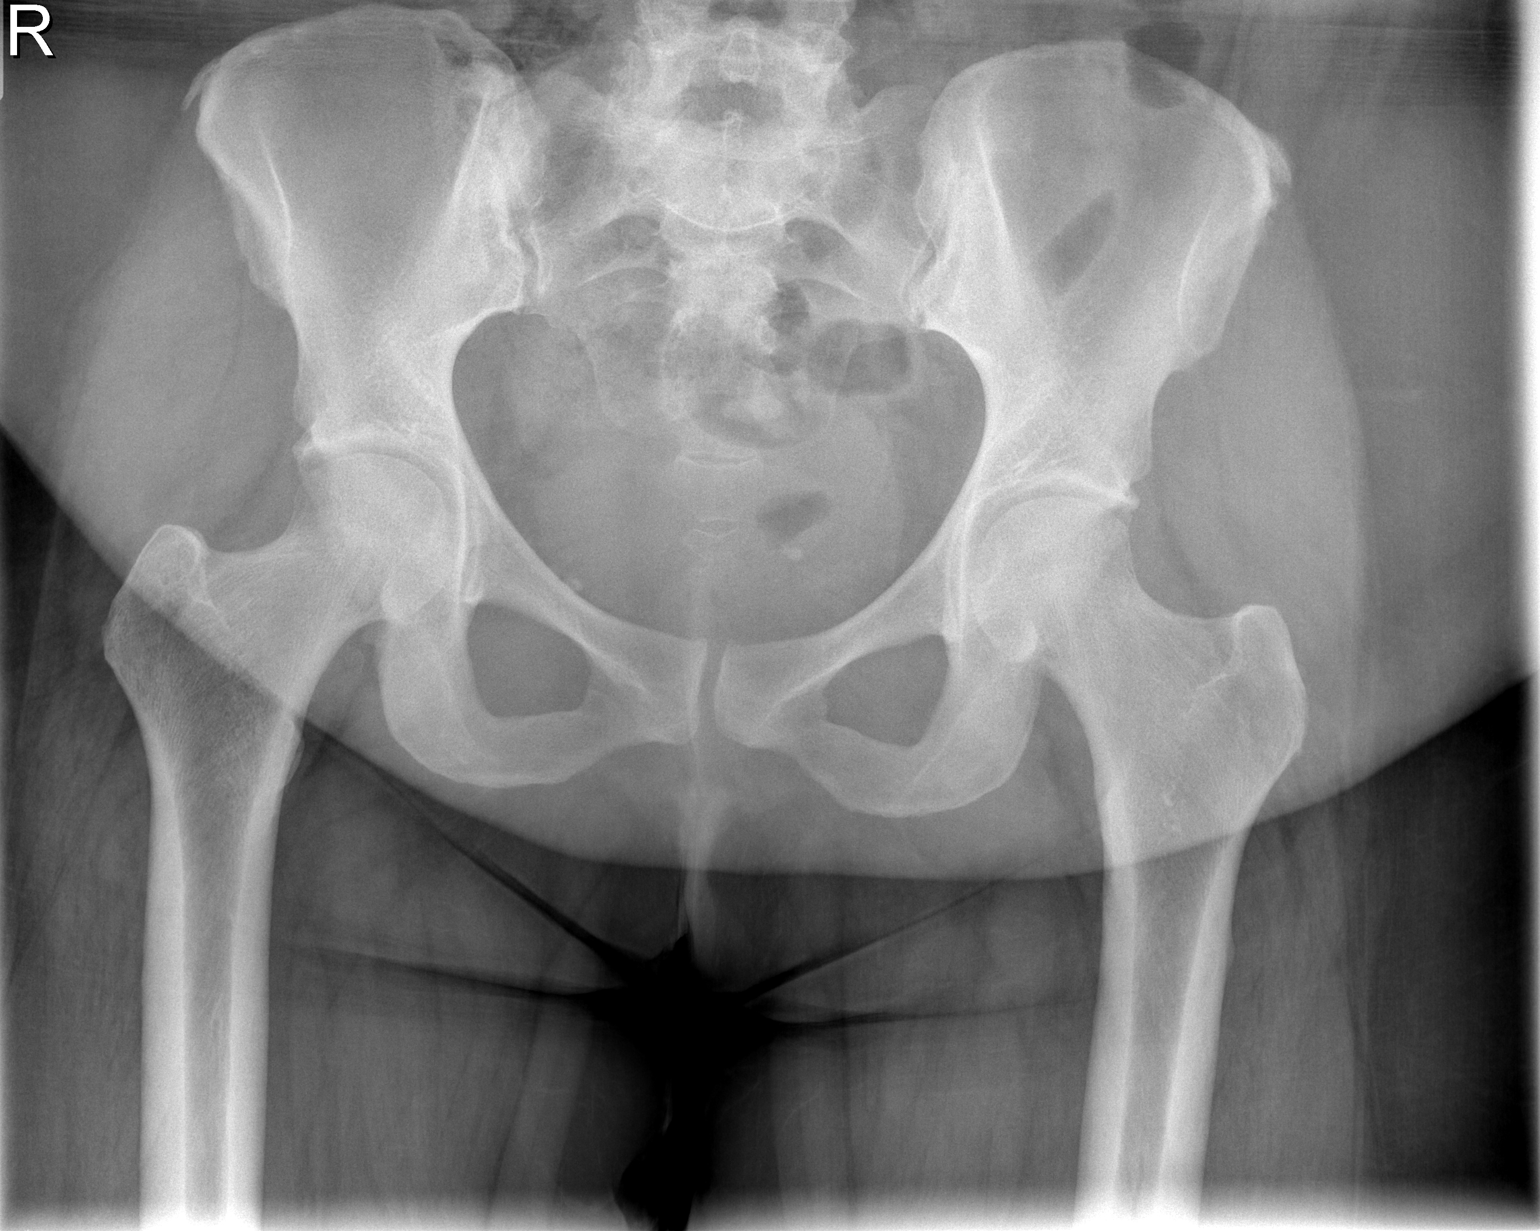

[t hip ap right]
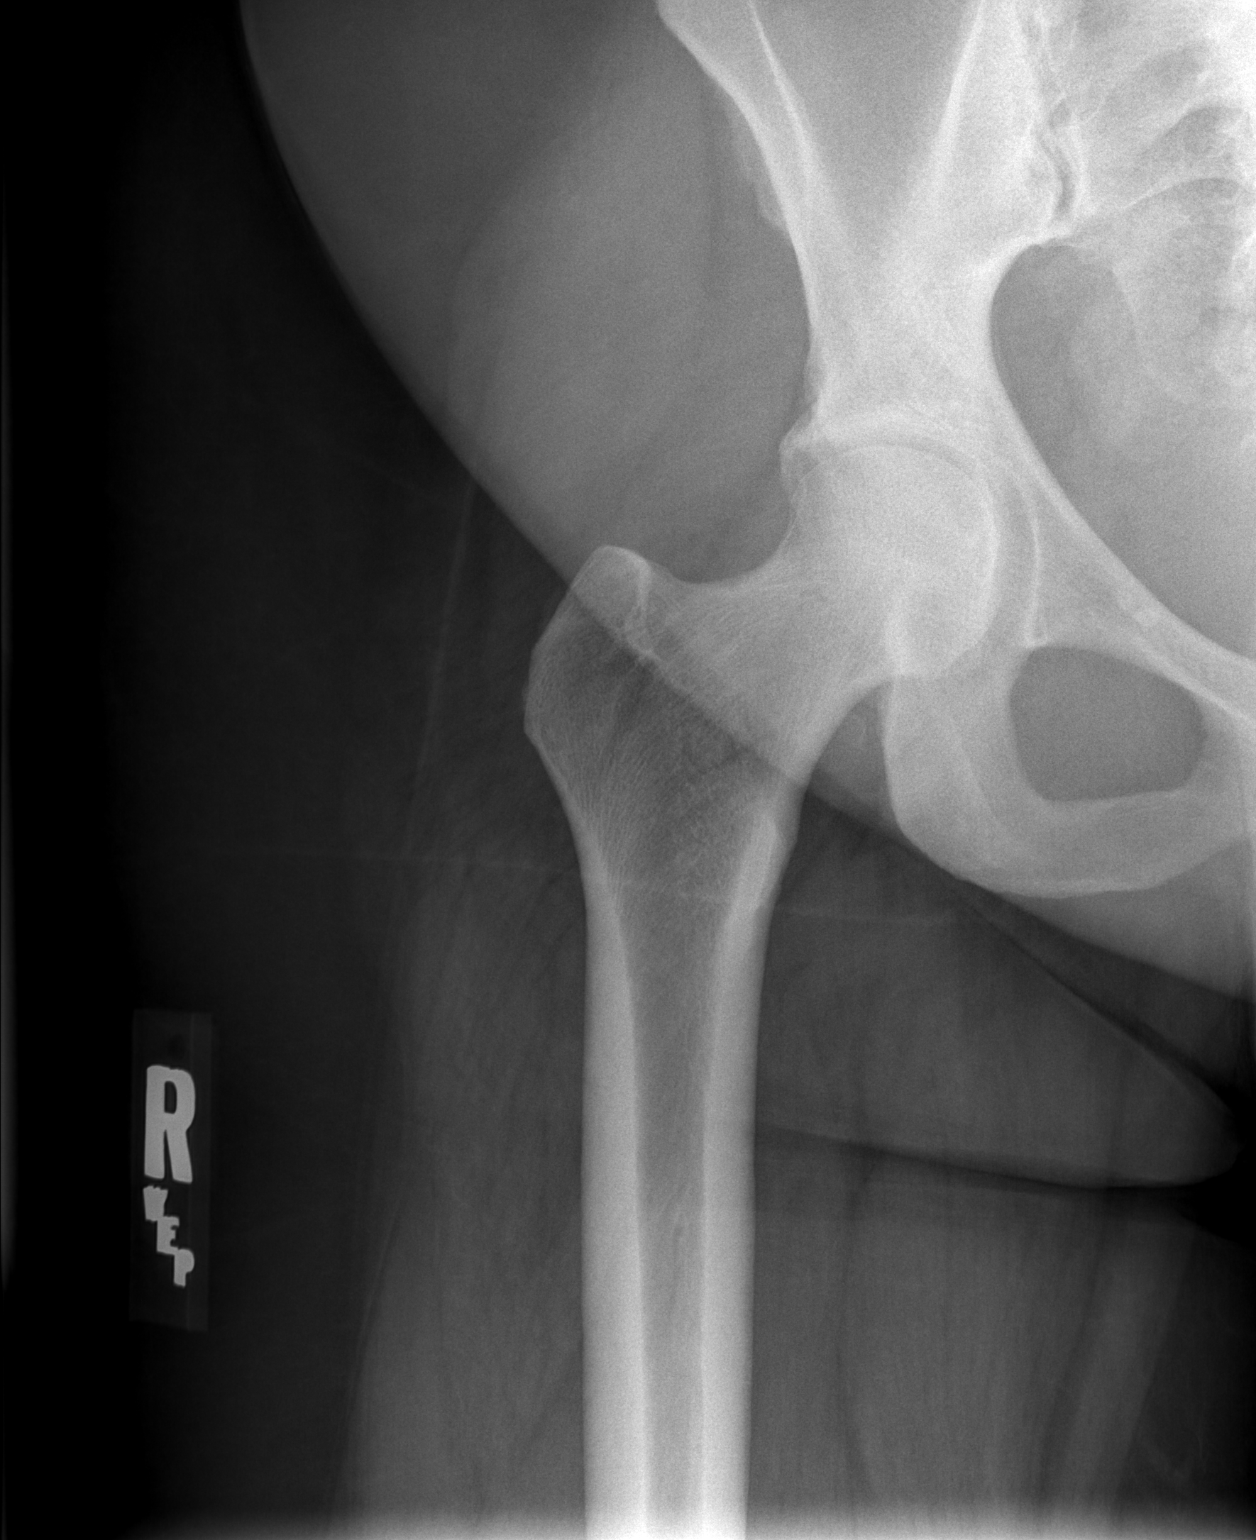

[t hip frog leg right]
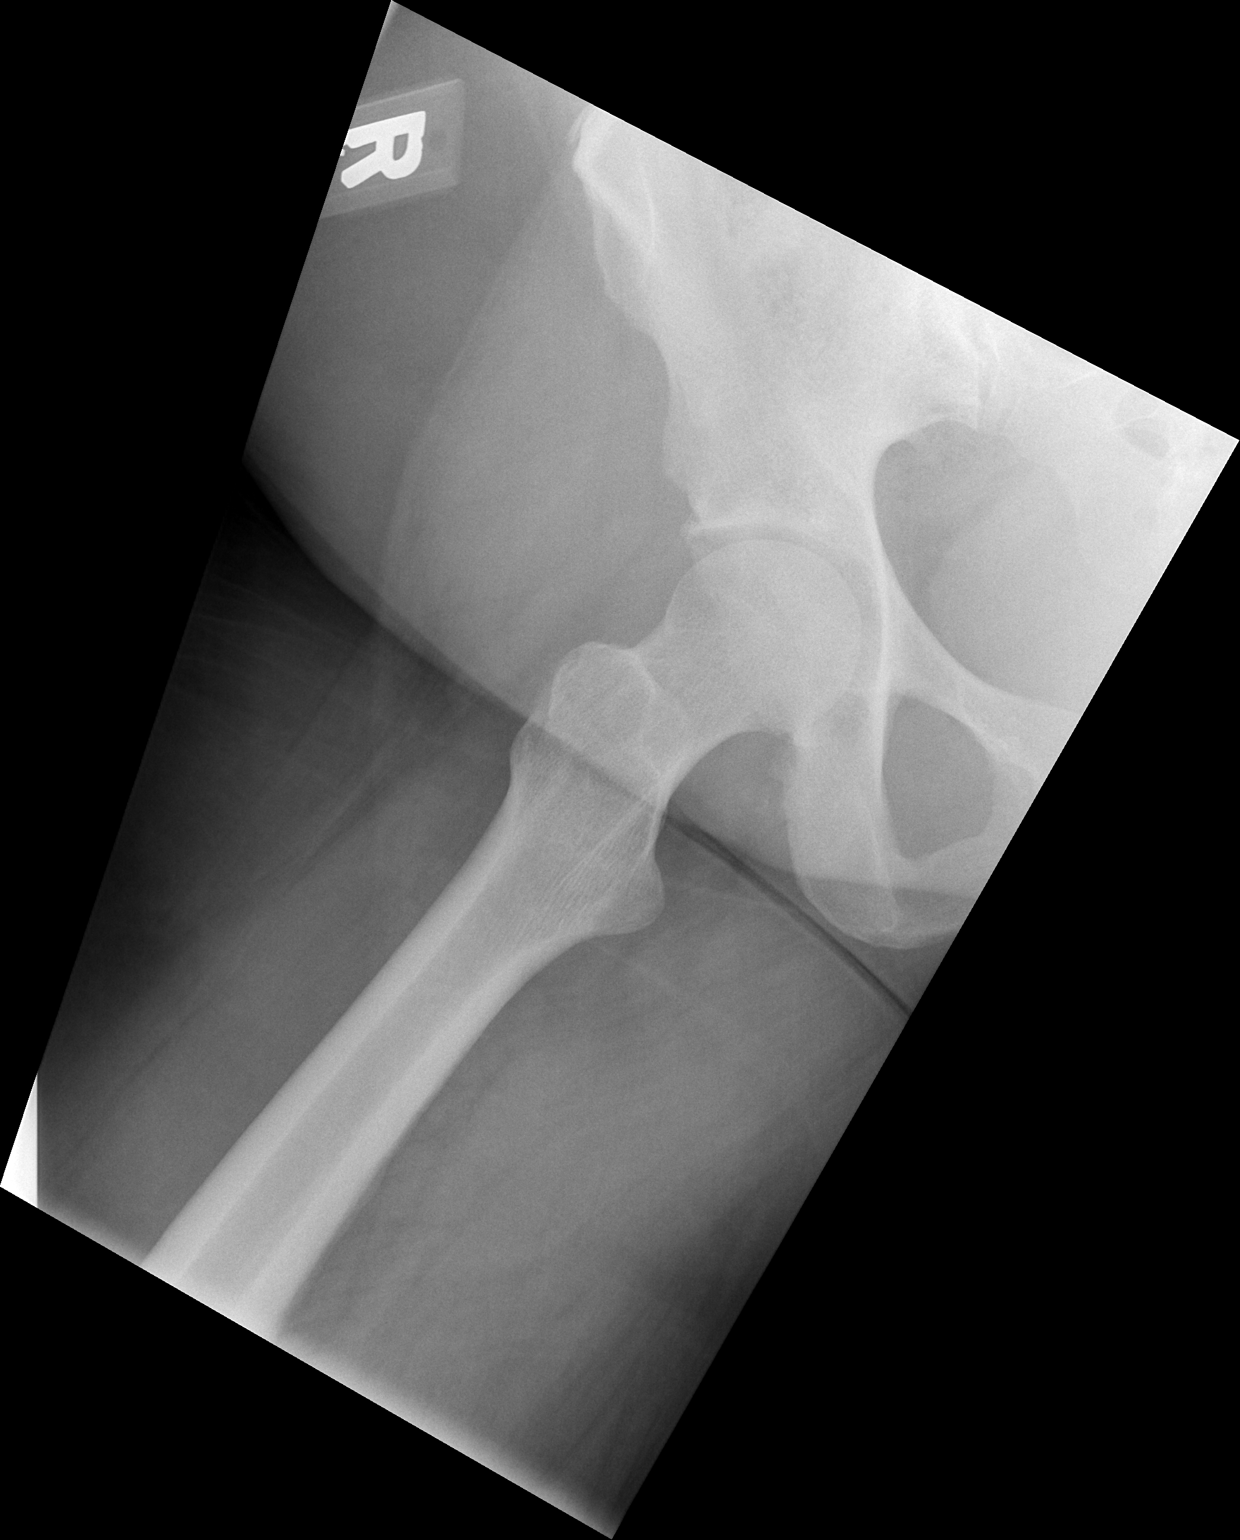

[3 of 3 positions shown; findings below may reference images not displayed]

FINDINGS: Normal bony mineralization.  Right hip is located.  Joint
spaces of both hips appear symmetric.  No osteophyte formation,
sclerosis, or bony erosion.  No acute or healing fracture of the
pelvis or right hip identified.  Sacroiliac joints have normal
appearances.
IMPRESSION: No acute bony abnormality or significant degenerative change of the
right hip or pelvis.

## 2012-01-08 ENCOUNTER — Encounter (HOSPITAL_COMMUNITY): Payer: Self-pay | Admitting: *Deleted

## 2012-01-08 ENCOUNTER — Emergency Department (HOSPITAL_COMMUNITY): Payer: Self-pay

## 2012-01-08 ENCOUNTER — Emergency Department (HOSPITAL_COMMUNITY)
Admission: EM | Admit: 2012-01-08 | Discharge: 2012-01-08 | Disposition: A | Discharge status: Home / Self Care | Payer: Self-pay | Attending: Emergency Medicine | Admitting: Emergency Medicine

## 2012-01-08 DIAGNOSIS — M79609 Pain in unspecified limb: Secondary | ICD-10-CM | POA: Insufficient documentation

## 2012-01-08 DIAGNOSIS — M549 Dorsalgia, unspecified: Secondary | ICD-10-CM | POA: Insufficient documentation

## 2012-01-08 DIAGNOSIS — R0789 Other chest pain: Secondary | ICD-10-CM | POA: Insufficient documentation

## 2012-01-08 LAB — POCT I-STAT, CHEM 8
Chloride: 105 mEq/L (ref 96–112)
HCT: 41 % (ref 36.0–46.0)
Hemoglobin: 13.9 g/dL (ref 12.0–15.0)
Potassium: 3.1 mEq/L — ABNORMAL LOW (ref 3.5–5.1)
Sodium: 142 mEq/L (ref 135–145)

## 2012-01-08 LAB — CBC
Platelets: 281 10*3/uL (ref 150–400)
RDW: 13.8 % (ref 11.5–15.5)
WBC: 11 10*3/uL — ABNORMAL HIGH (ref 4.0–10.5)

## 2012-01-08 LAB — POCT I-STAT TROPONIN I: Troponin i, poc: 0 ng/mL (ref 0.00–0.08)

## 2012-01-08 MED ORDER — IBUPROFEN 800 MG PO TABS: 800.0000 mg | ORAL_TABLET | Freq: Three times a day (TID) | ORAL | Status: AC

## 2012-01-08 MED ORDER — IOHEXOL 350 MG/ML SOLN
90.0000 mL | Freq: Once | INTRAVENOUS | Status: AC | PRN
  Administered 2012-01-08: 90 mL via INTRAVENOUS

## 2012-01-08 MED ORDER — POTASSIUM CHLORIDE CRYS ER 20 MEQ PO TBCR
40.0000 meq | EXTENDED_RELEASE_TABLET | Freq: Once | ORAL | Status: AC
  Administered 2012-01-08: 40 meq via ORAL
  Filled 2012-01-08: qty 2

## 2012-01-08 MED ORDER — HYDROCODONE-ACETAMINOPHEN 5-500 MG PO TABS: 1.0000 | ORAL_TABLET | Freq: Four times a day (QID) | ORAL | Status: AC | PRN

## 2012-01-08 NOTE — ED Notes (Signed)
Pt started having left side CP intermittent since Friday. Pain radiates to her left arm. Pain is sharp. Pain is 10 out of 10. Pt stated that intermittent SOB started on Tuesday. No N/V or diaphoresis with pain. Will continue to monitor.

## 2012-01-08 NOTE — ED Notes (Signed)
The pt has had pain beneath her lt breast since last Wednesday with sob nausea and dizziness.  The pain increases with movement. She has had a cold and congestion non-productive cough

## 2012-01-08 NOTE — Discharge Instructions (Signed)
Chest Pain (Nonspecific) It is often hard to give a specific diagnosis for the cause of chest pain. There is always a chance that your pain could be related to something serious, such as a heart attack or a blood clot in the lungs. You need to follow up with your caregiver for further evaluation. CAUSES   Heartburn.   Pneumonia or bronchitis.   Anxiety or stress.   Inflammation around your heart (pericarditis) or lung (pleuritis or pleurisy).   A blood clot in the lung.   A collapsed lung (pneumothorax). It can develop suddenly on its own (spontaneous pneumothorax) or from injury (trauma) to the chest.   Shingles infection (herpes zoster virus).  The chest wall is composed of bones, muscles, and cartilage. Any of these can be the source of the pain.  The bones can be bruised by injury.   The muscles or cartilage can be strained by coughing or overwork.   The cartilage can be affected by inflammation and become sore (costochondritis).  DIAGNOSIS  Lab tests or other studies, such as X-rays, electrocardiography, stress testing, or cardiac imaging, may be needed to find the cause of your pain.  TREATMENT   Treatment depends on what may be causing your chest pain. Treatment may include:   Acid blockers for heartburn.   Anti-inflammatory medicine.   Pain medicine for inflammatory conditions.   Antibiotics if an infection is present.   You may be advised to change lifestyle habits. This includes stopping smoking and avoiding alcohol, caffeine, and chocolate.   You may be advised to keep your head raised (elevated) when sleeping. This reduces the chance of acid going backward from your stomach into your esophagus.   Most of the time, nonspecific chest pain will improve within 2 to 3 days with rest and mild pain medicine.  HOME CARE INSTRUCTIONS   If antibiotics were prescribed, take your antibiotics as directed. Finish them even if you start to feel better.   For the next few  days, avoid physical activities that bring on chest pain. Continue physical activities as directed.   Do not smoke.   Avoid drinking alcohol.   Only take over-the-counter or prescription medicine for pain, discomfort, or fever as directed by your caregiver.   Follow your caregiver's suggestions for further testing if your chest pain does not go away.   Keep any follow-up appointments you made. If you do not go to an appointment, you could develop lasting (chronic) problems with pain. If there is any problem keeping an appointment, you must call to reschedule.  SEEK MEDICAL CARE IF:   You think you are having problems from the medicine you are taking. Read your medicine instructions carefully.   Your chest pain does not go away, even after treatment.   You develop a rash with blisters on your chest.  SEEK IMMEDIATE MEDICAL CARE IF:   You have increased chest pain or pain that spreads to your arm, neck, jaw, back, or abdomen.   You develop shortness of breath, an increasing cough, or you are coughing up blood.   You have severe back or abdominal pain, feel nauseous, or vomit.   You develop severe weakness, fainting, or chills.   You have a fever.  THIS IS AN EMERGENCY. Do not wait to see if the pain will go away. Get medical help at once. Call your local emergency services (911 in [place] Do not drive yourself to the hospital. MAKE SURE YOU:   Understand these instructions.     Will watch your condition.   Will get help right away if you are not doing well or get worse.   

## 2012-01-08 NOTE — ED Provider Notes (Signed)
History     CSN: [id number]  Arrival date & time 01/08/12  0453   First MD Initiated Contact with Patient 01/08/12 0505      Chief Complaint  Patient presents with  . Chest Pain    (Consider location/radiation/quality/duration/timing/severity/associated sxs/prior treatment) HPI History provided by patient. Having left sided chest pains the last 4 days unchanged. Pain is sharp in quality and not radiating it does hurt worse with deep inspiration. No fevers or cough. No history of DVT or blood clots. No leg pain or swelling. Patient was seen recently at [facility] for similar chest pains after a fall and was discharged home with prescription for Motrin. Patient has not filled his prescription. No history of diabetes, hypertension or heart disease. Symptoms moderate severity.   Past medical history reviewed patient, negative for hypertension, diabetes and heart disease.  History reviewed. No pertinent past surgical history.  No family history on file.  History  Substance Use Topics  . Smoking status: Current Everyday Smoker  . Smokeless tobacco: Not on file  . Alcohol Use: No    OB History    Grav Para Term Preterm Abortions TAB SAB Ect Mult Living                  Review of Systems  Constitutional: Negative for fever and chills.  HENT: Negative for neck pain and neck stiffness.   Eyes: Negative for pain.  Respiratory: Negative for cough and choking.   Cardiovascular: Positive for chest pain.  Gastrointestinal: Negative for abdominal pain.  Genitourinary: Negative for dysuria.  Musculoskeletal: Negative for back pain.  Skin: Negative for rash.  Neurological: Negative for headaches.  All other systems reviewed and are negative.    Allergies  Review of patient's allergies indicates no known allergies.  Home Medications   Current Outpatient Rx  Name Route Sig Dispense Refill  . ACETAMINOPHEN ER 650 MG PO TBCR Oral Take 650-1,300 mg by mouth 2 (two) times daily as  needed. For pain.    Marland Kitchen AZITHROMYCIN 250 MG PO TABS Oral Take 250 mg by mouth daily.    Marland Kitchen DEXTROMETHORPHAN HBR 15 MG/5ML PO SYRP Oral Take 10 mLs by mouth 4 (four) times daily as needed. For cough      BP 115/76  Pulse 105  Temp(Src) 98.6 F (37 C) (Oral)  Resp 24  SpO2 99%  LMP 01/03/2012  Physical Exam  Constitutional: She is oriented to person, place, and time. She appears well-developed and well-nourished.  HENT:  Head: Normocephalic and atraumatic.  Eyes: Conjunctivae and EOM are normal. Pupils are equal, round, and reactive to light.  Neck: Trachea normal. Neck supple. No thyromegaly present.  Cardiovascular: Normal rate, regular rhythm, S1 normal, S2 normal and normal pulses.     No systolic murmur is present   No diastolic murmur is present  Pulses:      Radial pulses are 2+ on the right side, and 2+ on the left side.  Pulmonary/Chest: Effort normal and breath sounds normal. She has no wheezes. She has no rhonchi. She has no rales. She exhibits no tenderness.       Localizes discomfort to left anterior chest wall without crepitus or rash. No reproducible tenderness.  Abdominal: Soft. Normal appearance and bowel sounds are normal. There is no tenderness. There is no CVA tenderness and negative Murphy's sign.  Musculoskeletal:       BLE:s Calves nontender, no cords or erythema, negative Homans sign  Neurological: She is alert and  oriented to person, place, and time. She has normal strength. No cranial nerve deficit or sensory deficit. GCS eye subscore is 4. GCS verbal subscore is 5. GCS motor subscore is 6.  Skin: Skin is warm and dry. No rash noted. She is not diaphoretic.  Psychiatric: Her speech is normal.       Cooperative and appropriate    ED Course  Procedures (including critical care time)  Labs Reviewed  CBC - Abnormal; Notable for the following:    WBC 11.0 (*)    All other components within normal limits  POCT I-STAT, CHEM 8 - Abnormal; Notable for the  following:    Potassium 3.1 (*)    BUN <3 (*)    Glucose, Bld 215 (*)    All other components within normal limits  POCT I-STAT TROPONIN I   Ct Angio Chest W/cm &/or Wo Cm  01/08/2012  *RADIOLOGY REPORT*  Clinical Data: Sharp pain in the left chest radiating to the back and left arm.  Shortness of breath.  Dizziness.  Nausea. Nonproductive cough.  Symptoms began on Wednesday.  CT ANGIOGRAPHY CHEST  Technique:  Multidetector CT imaging of the chest using the standard protocol during bolus administration of intravenous contrast. Multiplanar reconstructed images including MIPs were obtained and reviewed to evaluate the vascular anatomy.  Contrast: 90mL OMNIPAQUE IOHEXOL 350 MG/ML SOLN  Comparison: 10/02/2011  Findings: Technically adequate study with moderately good opacification of the central and proximal segmental pulmonary arteries.  Distal peripheral pulmonary arteries are not well imaged.  No focal filling defects.  No evidence of significant pulmonary embolus.  Normal caliber thoracic aorta.  Normal heart size.  The esophagus is decompressed.  No significant lymphadenopathy in the chest.  No pleural effusions.  Visualized upper abdominal organs appear unremarkable.  Respiratory motion artifact limits visualization of the lung fields.  Patchy ground- glass changes seen previously in the upper lungs have mostly resolved with minimal central ground-glass changes remaining.  No focal airspace consolidation.  No significant interstitial changes. Airways appear patent.  Degenerative changes in the thoracic spine.  IMPRESSION: No evidence of significant pulmonary embolus.  Previously identified ground-glass infiltrative changes have almost completely resolved.  Original Report Authenticated By: Marlon Pel, M.D.   Dg Chest Portable 1 View  01/08/2012  *RADIOLOGY REPORT*  Clinical Data: Left-sided chest pain since Sunday.  PORTABLE CHEST - 1 VIEW  Comparison: 10/02/2011  Findings: Shallow inspiration.   Normal heart size and pulmonary vascularity.  Lungs appear clear expanded.  No focal airspace consolidation.  No blunting of costophrenic angles.  No pneumothorax.  No significant changes since the previous study.  IMPRESSION: No evidence of active pulmonary disease.  Shallow inspiration.  Original Report Authenticated By: Marlon Pel, M.D.    Date: 01/08/2012  Rate: 104  Rhythm: sinus tachycardia  QRS Axis: normal  Intervals: normal  ST/T Wave abnormalities: nonspecific ST changes  Conduction Disutrbances:none  Narrative Interpretation:   Old EKG Reviewed: unchanged    MDM  Atypical chest pain the last 4 days evaluated with stat EKG reviewed as above- no acute ischemia. Troponin labs obtained and reviewed as above. PE study negative for blood clots, pneumonia or etiology for symptoms. Plan discharge home with prescription for pain medications and followup with her primary care physician at [facility]. Pain improved in the ED in stable for discharge home.        Sunnie Nielsen, MD 01/08/12 [phone]

## 2012-05-04 ENCOUNTER — Encounter (HOSPITAL_COMMUNITY): Payer: Self-pay | Admitting: Emergency Medicine

## 2012-05-04 ENCOUNTER — Emergency Department (HOSPITAL_COMMUNITY)
Admission: EM | Admit: 2012-05-04 | Discharge: 2012-05-04 | Disposition: A | Discharge status: Home / Self Care | Payer: Self-pay | Attending: Emergency Medicine | Admitting: Emergency Medicine

## 2012-05-04 DIAGNOSIS — A64 Unspecified sexually transmitted disease: Secondary | ICD-10-CM | POA: Insufficient documentation

## 2012-05-04 DIAGNOSIS — F17200 Nicotine dependence, unspecified, uncomplicated: Secondary | ICD-10-CM | POA: Insufficient documentation

## 2012-05-04 DIAGNOSIS — Z8739 Personal history of other diseases of the musculoskeletal system and connective tissue: Secondary | ICD-10-CM | POA: Insufficient documentation

## 2012-05-04 DIAGNOSIS — I10 Essential (primary) hypertension: Secondary | ICD-10-CM | POA: Insufficient documentation

## 2012-05-04 DIAGNOSIS — E119 Type 2 diabetes mellitus without complications: Secondary | ICD-10-CM | POA: Insufficient documentation

## 2012-05-04 DIAGNOSIS — I2510 Atherosclerotic heart disease of native coronary artery without angina pectoris: Secondary | ICD-10-CM | POA: Insufficient documentation

## 2012-05-04 HISTORY — DX: Unspecified osteoarthritis, unspecified site: M19.90

## 2012-05-04 LAB — WET PREP, GENITAL

## 2012-05-04 LAB — URINE MICROSCOPIC-ADD ON

## 2012-05-04 LAB — POCT PREGNANCY, URINE: Preg Test, Ur: NEGATIVE

## 2012-05-04 LAB — URINALYSIS, ROUTINE W REFLEX MICROSCOPIC
Ketones, ur: NEGATIVE mg/dL
Protein, ur: NEGATIVE mg/dL
Urobilinogen, UA: 0.2 mg/dL (ref 0.0–1.0)

## 2012-05-04 MED ORDER — METRONIDAZOLE 500 MG PO TABS
2000.0000 mg | ORAL_TABLET | Freq: Once | ORAL | Status: AC
  Administered 2012-05-04: 2000 mg via ORAL
  Filled 2012-05-04: qty 4

## 2012-05-04 MED ORDER — AZITHROMYCIN 1 G PO PACK
1.0000 g | PACK | Freq: Once | ORAL | Status: AC
  Administered 2012-05-04: 1 g via ORAL
  Filled 2012-05-04: qty 1

## 2012-05-04 MED ORDER — CEFTRIAXONE SODIUM 250 MG IJ SOLR
250.0000 mg | Freq: Once | INTRAMUSCULAR | Status: AC
  Administered 2012-05-04: 250 mg via INTRAMUSCULAR
  Filled 2012-05-04: qty 250

## 2012-05-04 MED ORDER — LIDOCAINE HCL (PF) 1 % IJ SOLN
INTRAMUSCULAR | Status: AC
  Administered 2012-05-04: 2 mL
  Filled 2012-05-04: qty 5

## 2012-05-04 NOTE — ED Notes (Signed)
C/o yellow vaginal discharge and vaginal itching x 1 week.

## 2012-05-06 NOTE — ED Provider Notes (Signed)
History     CSN: [id number]  Arrival date & time 05/04/12  1733   First MD Initiated Contact with Patient 05/04/12 2058      Chief Complaint  Patient presents with  . Vaginal Discharge    (Consider location/radiation/quality/duration/timing/severity/associated sxs/prior treatment) HPI Comments: Pt comes in with cc of of vaginal discharge. Dsicharge started 3 days ago, yellow, with no suprapubic pain. Her fiance admitted to cheating on her. No uti like sx.  Patient is a 51 y.o. female presenting with vaginal discharge. The history is provided by the patient.  Vaginal Discharge Pertinent negatives include no chest pain, no abdominal pain, no headaches and no shortness of breath.    Past Medical History  Diagnosis Date  . Diabetes mellitus   . Hypertension   . Coronary artery disease   . Arthritis     History reviewed. No pertinent past surgical history.  No family history on file.  History  Substance Use Topics  . Smoking status: Current Everyday Smoker  . Smokeless tobacco: Not on file  . Alcohol Use: No    OB History    Grav Para Term Preterm Abortions TAB SAB Ect Mult Living                  Review of Systems  Constitutional: Positive for activity change.  HENT: Negative for neck pain.   Respiratory: Negative for shortness of breath.   Cardiovascular: Negative for chest pain.  Gastrointestinal: Negative for nausea, vomiting and abdominal pain.  Genitourinary: Positive for vaginal discharge. Negative for dysuria.  Neurological: Negative for headaches.    Allergies  Review of patient's allergies indicates no known allergies.  Home Medications   Current Outpatient Rx  Name Route Sig Dispense Refill  . ACETAMINOPHEN ER 650 MG PO TBCR Oral Take 650-1,300 mg by mouth 2 (two) times daily as needed. For pain.      BP 121/83  Pulse 97  Temp 98.5 F (36.9 C) (Oral)  Resp 18  SpO2 95%  LMP 04/06/2012  Physical Exam  Nursing note and vitals  reviewed. Constitutional: She is oriented to person, place, and time. She appears well-developed and well-nourished.  HENT:  Head: Normocephalic and atraumatic.  Eyes: Conjunctivae and EOM are normal. Pupils are equal, round, and reactive to light.  Neck: Normal range of motion. Neck supple.  Cardiovascular: Normal rate, regular rhythm, normal heart sounds and intact distal pulses.   No murmur heard. Pulmonary/Chest: Effort normal. No respiratory distress. She has no wheezes.  Abdominal: Soft. Bowel sounds are normal. She exhibits no distension. There is no tenderness. There is no rebound and no guarding.  Genitourinary: Vagina normal and uterus normal.       External exam - normal, no lesions Speculum exam: Pt has some yellow discharge, no blood Bimanual exam: Patient has no CMT, no adnexal tenderness or fullness and cervical os is closed  Neurological: She is alert and oriented to person, place, and time.  Skin: Skin is warm and dry.    ED Course  Procedures (including critical care time)  Labs Reviewed  URINALYSIS, ROUTINE W REFLEX MICROSCOPIC - Abnormal; Notable for the following:    APPearance CLOUDY (*)     Hgb urine dipstick SMALL (*)     Leukocytes, UA LARGE (*)     All other components within normal limits  WET PREP, GENITAL - Abnormal; Notable for the following:    Trich, Wet Prep FEW (*)     WBC, Wet Prep HPF  POC MANY (*)     All other components within normal limits  URINE MICROSCOPIC-ADD ON - Abnormal; Notable for the following:    Squamous Epithelial / LPF FEW (*)     All other components within normal limits  POCT PREGNANCY, URINE  GC/CHLAMYDIA PROBE AMP, GENITAL   No results found.   1. STD (female)       MDM  DDX includes: Bacterial Vaginosis Yeast infection STD - GC, Chlamydia, Trichomonas UTI  Based on hx and exam - likely a STD. Will treat for gc/chlamydia and f/u on trich.   Trich + as well - given flagyl.        Derwood Kaplan,  MD 05/06/12 1752

## 2012-08-29 ENCOUNTER — Emergency Department (HOSPITAL_COMMUNITY)
Admission: EM | Admit: 2012-08-29 | Discharge: 2012-08-30 | Disposition: A | Discharge status: Home / Self Care | Payer: Self-pay | Attending: Emergency Medicine | Admitting: Emergency Medicine

## 2012-08-29 ENCOUNTER — Encounter (HOSPITAL_COMMUNITY): Payer: Self-pay | Admitting: *Deleted

## 2012-08-29 DIAGNOSIS — F17200 Nicotine dependence, unspecified, uncomplicated: Secondary | ICD-10-CM | POA: Insufficient documentation

## 2012-08-29 DIAGNOSIS — E119 Type 2 diabetes mellitus without complications: Secondary | ICD-10-CM | POA: Insufficient documentation

## 2012-08-29 DIAGNOSIS — J069 Acute upper respiratory infection, unspecified: Secondary | ICD-10-CM | POA: Insufficient documentation

## 2012-08-29 DIAGNOSIS — I10 Essential (primary) hypertension: Secondary | ICD-10-CM | POA: Insufficient documentation

## 2012-08-29 DIAGNOSIS — Z79899 Other long term (current) drug therapy: Secondary | ICD-10-CM | POA: Insufficient documentation

## 2012-08-29 DIAGNOSIS — M129 Arthropathy, unspecified: Secondary | ICD-10-CM | POA: Insufficient documentation

## 2012-08-29 DIAGNOSIS — I2510 Atherosclerotic heart disease of native coronary artery without angina pectoris: Secondary | ICD-10-CM | POA: Insufficient documentation

## 2012-08-29 MED ORDER — ACETAMINOPHEN 325 MG PO TABS
650.0000 mg | ORAL_TABLET | Freq: Once | ORAL | Status: AC
  Administered 2012-08-29: 650 mg via ORAL

## 2012-08-29 MED ORDER — BENZONATATE 100 MG PO CAPS
200.0000 mg | ORAL_CAPSULE | Freq: Three times a day (TID) | ORAL | Status: DC | PRN
  Filled 2012-08-29: qty 2

## 2012-08-29 NOTE — ED Provider Notes (Signed)
History     CSN: [id number]  Arrival date & time 08/29/12  2152   First MD Initiated Contact with Patient 08/29/12 2317      Chief Complaint  Patient presents with  . Flu Like Symptoms     (Consider location/radiation/quality/duration/timing/severity/associated sxs/prior treatment) HPI 52 year old female presents to emergency room complaining of one week of cold symptoms with 2-3 days of worsening cough, chest pain with cough, subjective fever and chills. Patient reports she started with a mild cold with runny nose about a week ago. Over the last 3 days she has had worsening body aches. Patient with history of atrial fibrillation, diabetes hypertension. She reports in August she was taken off her medications by her clinic in [facility]. She has not returned since having blood work done as a recheck. She denies any chest pain without cough. She denies any shortness of breath. She reports sore throat with swallowing. She has been trying salt water gargles and hot teas.  Past Medical History  Diagnosis Date  . Diabetes mellitus   . Hypertension   . Coronary artery disease   . Arthritis     Past Surgical History  Procedure Date  . Hernia repair   . Appendectomy     History reviewed. No pertinent family history.  History  Substance Use Topics  . Smoking status: Current Every Day Smoker  . Smokeless tobacco: Not on file  . Alcohol Use: No    OB History    Grav Para Term Preterm Abortions TAB SAB Ect Mult Living                  Review of Systems  See History of Present Illness; otherwise all other systems are reviewed and negative  Allergies  Review of patient's allergies indicates no known allergies.  Home Medications   Current Outpatient Rx  Name  Route  Sig  Dispense  Refill  . NAPROXEN SODIUM 220 MG PO TABS   Oral   Take 220 mg by mouth 2 (two) times daily as needed. For pain           BP 143/82  Pulse 119  Temp 100.3 F (37.9 C) (Oral)  Resp 22   SpO2 98%  Physical Exam  Nursing note and vitals reviewed. Constitutional: She is oriented to person, place, and time. She appears well-developed and well-nourished.  HENT:  Head: Normocephalic and atraumatic.  Right Ear: External ear normal.  Left Ear: External ear normal.  Mouth/Throat: Oropharynx is clear and moist.       Rhinorrhea, congestion  Eyes: Conjunctivae normal and EOM are normal. Pupils are equal, round, and reactive to light.  Neck: Normal range of motion. Neck supple. No JVD present. No tracheal deviation present. No thyromegaly present.  Cardiovascular: Normal rate, regular rhythm, normal heart sounds and intact distal pulses.  Exam reveals no gallop and no friction rub.   No murmur heard. Pulmonary/Chest: Effort normal and breath sounds normal. No stridor. No respiratory distress. She has no wheezes. She has no rales. She exhibits no tenderness.       Cough  Abdominal: Soft. Bowel sounds are normal. She exhibits no distension and no mass. There is no tenderness. There is no rebound and no guarding.  Musculoskeletal: Normal range of motion. She exhibits no edema and no tenderness.  Lymphadenopathy:    She has no cervical adenopathy.  Neurological: She is alert and oriented to person, place, and time. No cranial nerve deficit. She exhibits normal muscle  tone. Coordination normal.  Skin: Skin is warm and dry. No rash noted. No erythema. No pallor.  Psychiatric: She has a normal mood and affect. Her behavior is normal. Judgment and thought content normal.    ED Course  Procedures (including critical care time)   Labs Reviewed  RAPID STREP SCREEN   Dg Chest 2 View  08/30/2012  *RADIOLOGY REPORT*  Clinical Data: Smoker with chest pain, cough and chest congestion.  CHEST - 2 VIEW  Comparison: Previous examinations.  Findings: Normal sized heart.  Clear lungs.  No significant change in mild central peribronchial thickening.  Thoracic spine degenerative changes.  IMPRESSION:  No acute abnormality.  Stable mild chronic bronchitic changes.   Original Report Authenticated By: Beckie Salts, M.D.      1. Viral upper respiratory illness       MDM  52 year old female with upper respiratory illness by history and exam. Differential also includes atypical pneumonia, bronchitis. Patient mentions that she has a history of "weak kidneys" and diabetes. We'll check i-STAT 8, chest x-ray and strep screen.        Olivia Mackie, MD 08/30/12 [phone]

## 2012-08-29 NOTE — ED Notes (Signed)
Pt c/o sore throat,  Yellow productive cough, pain with cough, fever/chills.  Sx x 1 week.

## 2012-08-30 ENCOUNTER — Emergency Department (HOSPITAL_COMMUNITY): Payer: Self-pay

## 2012-08-30 MED ORDER — BENZONATATE 200 MG PO CAPS: 200.0000 mg | ORAL_CAPSULE | Freq: Three times a day (TID) | ORAL | Status: DC | PRN

## 2012-08-30 MED ORDER — HYDROCOD POLST-CHLORPHEN POLST 10-8 MG/5ML PO LQCR: 5.0000 mL | Freq: Two times a day (BID) | ORAL | Status: DC | PRN

## 2012-08-30 NOTE — ED Notes (Signed)
Pt sts she has taken tessalon x3 today. Last dose at 1800

## 2012-09-01 LAB — POCT I-STAT, CHEM 8
Calcium, Ion: 1.17 mmol/L (ref 1.12–1.23)
Creatinine, Ser: 0.8 mg/dL (ref 0.50–1.10)
Glucose, Bld: 116 mg/dL — ABNORMAL HIGH (ref 70–99)
HCT: 39 % (ref 36.0–46.0)
Hemoglobin: 13.3 g/dL (ref 12.0–15.0)
Potassium: 3.7 mEq/L (ref 3.5–5.1)
TCO2: 24 mmol/L (ref 0–100)

## 2012-12-06 ENCOUNTER — Emergency Department (HOSPITAL_COMMUNITY)
Admission: EM | Admit: 2012-12-06 | Discharge: 2012-12-06 | Disposition: A | Discharge status: Home / Self Care | Payer: Self-pay | Attending: Emergency Medicine | Admitting: Emergency Medicine

## 2012-12-06 ENCOUNTER — Emergency Department (HOSPITAL_COMMUNITY): Payer: Self-pay

## 2012-12-06 ENCOUNTER — Encounter (HOSPITAL_COMMUNITY): Payer: Self-pay | Admitting: *Deleted

## 2012-12-06 DIAGNOSIS — Z862 Personal history of diseases of the blood and blood-forming organs and certain disorders involving the immune mechanism: Secondary | ICD-10-CM | POA: Insufficient documentation

## 2012-12-06 DIAGNOSIS — I2510 Atherosclerotic heart disease of native coronary artery without angina pectoris: Secondary | ICD-10-CM | POA: Insufficient documentation

## 2012-12-06 DIAGNOSIS — F17200 Nicotine dependence, unspecified, uncomplicated: Secondary | ICD-10-CM | POA: Insufficient documentation

## 2012-12-06 DIAGNOSIS — Z8639 Personal history of other endocrine, nutritional and metabolic disease: Secondary | ICD-10-CM | POA: Insufficient documentation

## 2012-12-06 DIAGNOSIS — R079 Chest pain, unspecified: Secondary | ICD-10-CM | POA: Insufficient documentation

## 2012-12-06 DIAGNOSIS — R42 Dizziness and giddiness: Secondary | ICD-10-CM | POA: Insufficient documentation

## 2012-12-06 DIAGNOSIS — R51 Headache: Secondary | ICD-10-CM | POA: Insufficient documentation

## 2012-12-06 DIAGNOSIS — R209 Unspecified disturbances of skin sensation: Secondary | ICD-10-CM | POA: Insufficient documentation

## 2012-12-06 DIAGNOSIS — E119 Type 2 diabetes mellitus without complications: Secondary | ICD-10-CM | POA: Insufficient documentation

## 2012-12-06 DIAGNOSIS — Z8739 Personal history of other diseases of the musculoskeletal system and connective tissue: Secondary | ICD-10-CM | POA: Insufficient documentation

## 2012-12-06 DIAGNOSIS — I10 Essential (primary) hypertension: Secondary | ICD-10-CM | POA: Insufficient documentation

## 2012-12-06 HISTORY — DX: Hypokalemia: E87.6

## 2012-12-06 LAB — CBC
MCH: 29.5 pg (ref 26.0–34.0)
MCHC: 35.8 g/dL (ref 30.0–36.0)
Platelets: 277 10*3/uL (ref 150–400)
RDW: 13.7 % (ref 11.5–15.5)

## 2012-12-06 LAB — BASIC METABOLIC PANEL
BUN: 8 mg/dL (ref 6–23)
Calcium: 9.8 mg/dL (ref 8.4–10.5)
Creatinine, Ser: 0.67 mg/dL (ref 0.50–1.10)
GFR calc Af Amer: >90 mL/min (ref >90)
GFR calc non Af Amer: >90 mL/min (ref >90)
Glucose, Bld: 127 mg/dL — ABNORMAL HIGH (ref 70–99)
Potassium: 3.7 mEq/L (ref 3.5–5.1)

## 2012-12-06 LAB — POCT I-STAT TROPONIN I: Troponin i, poc: 0 ng/mL (ref 0.00–0.08)

## 2012-12-06 MED ORDER — ASPIRIN 81 MG PO CHEW
324.0000 mg | CHEWABLE_TABLET | Freq: Once | ORAL | Status: AC
  Administered 2012-12-06: 324 mg via ORAL
  Filled 2012-12-06: qty 4

## 2012-12-06 NOTE — ED Provider Notes (Signed)
History     CSN: [id number]  Arrival date & time 12/06/12  0128   First MD Initiated Contact with Patient 12/06/12 0139      Chief Complaint  Patient presents with  . Chest Pain    (Consider location/radiation/quality/duration/timing/severity/associated sxs/prior treatment) HPI History provided by patient. Chest pain on and off for the last week with some shortness of breath. Pain is left-sided and an aching in quality and not radiating. Patient does have some numbness in her left arm but no pain. Currently pain free without any symptoms except for mild headache. No known aggravating or alleviating factors. Patient states she has felt sick the last couple weeks as well denies any productive cough or fevers. No leg pain or leg swelling. No nausea vomiting. No diaphoresis. Symptoms mild/moderate in severity. Past Medical History  Diagnosis Date  . Diabetes mellitus   . Hypertension   . Coronary artery disease   . Arthritis   . Hypokalemia     Past Surgical History  Procedure Laterality Date  . Hernia repair    . Appendectomy      No family history on file.  History  Substance Use Topics  . Smoking status: Current Every Day Smoker    Types: Cigarettes  . Smokeless tobacco: Not on file  . Alcohol Use: No    OB History   Grav Para Term Preterm Abortions TAB SAB Ect Mult Living                  Review of Systems  Constitutional: Negative for fever and chills.  HENT: Negative for neck pain and neck stiffness.   Eyes: Negative for pain.  Respiratory: Negative for cough and wheezing.   Cardiovascular: Positive for chest pain.  Gastrointestinal: Negative for abdominal pain.  Genitourinary: Negative for dysuria.  Musculoskeletal: Negative for back pain.  Skin: Negative for rash.  Neurological: Negative for headaches.  All other systems reviewed and are negative.    Allergies  Aspirin  Home Medications  No current outpatient prescriptions on file.  BP 123/65   Pulse 91  Temp(Src) 98.7 F (37.1 C) (Oral)  Resp 18  SpO2 100%  LMP 11/11/2012  Physical Exam  Constitutional: She is oriented to person, place, and time. She appears well-developed and well-nourished.  HENT:  Head: Normocephalic and atraumatic.  Eyes: Conjunctivae and EOM are normal. Pupils are equal, round, and reactive to light.  Neck: Trachea normal. Neck supple. No thyromegaly present.  Cardiovascular: Normal rate, regular rhythm, S1 normal, S2 normal and normal pulses.     No systolic murmur is present   No diastolic murmur is present  Pulses:      Radial pulses are 2+ on the right side, and 2+ on the left side.  Pulmonary/Chest: Effort normal and breath sounds normal. She has no wheezes. She has no rhonchi. She has no rales. She exhibits no tenderness.  Abdominal: Soft. Normal appearance and bowel sounds are normal. There is no tenderness. There is no CVA tenderness and negative Murphy's sign.  Musculoskeletal:  BLE:s Calves nontender, no cords or erythema, negative Homans sign  Neurological: She is alert and oriented to person, place, and time. She has normal strength. No cranial nerve deficit or sensory deficit. GCS eye subscore is 4. GCS verbal subscore is 5. GCS motor subscore is 6.  Skin: Skin is warm and dry. No rash noted. She is not diaphoretic.  Psychiatric: Her speech is normal.  Cooperative and appropriate    ED Course  Procedures (including critical care time)  Labs Reviewed  BASIC METABOLIC PANEL - Abnormal; Notable for the following:    Glucose, Bld 127 (*)    All other components within normal limits  CBC - Abnormal; Notable for the following:    WBC 15.9 (*)    All other components within normal limits  PRO B NATRIURETIC PEPTIDE  POCT I-STAT TROPONIN I   Dg Chest Port 1 View  12/06/2012  *RADIOLOGY REPORT*  Clinical Data: Left-sided chest pain.  Shortness of breath.  Near- syncope.  CHEST - 1 VIEW  Comparison:  08/30/2012  Findings: The heart size  and mediastinal contours are within normal limits.  Both lungs are clear.  IMPRESSION: No active disease.   Original Report Authenticated By: Myles Rosenthal, M.D.      Date: 12/06/2012  Rate: 90  Rhythm: normal sinus rhythm  QRS Axis: normal  Intervals: normal  ST/T Wave abnormalities: nonspecific ST changes  Conduction Disutrbances:none  Narrative Interpretation:   Old EKG Reviewed: unchanged  Aspirin provided. Asymptomatic in emergency department  Plan outpatient stress testing. Strict return precautions provided and verbalizes understood. Stable for discharge home MDM  Chest pain with last symptoms earlier today. Negative troponin and EKG without changes.   EKG. Chest x-ray. Labs.  Vital signs and nursing notes reviewed. Old records reviewed.      Sunnie Nielsen, MD 12/06/12 [phone]

## 2012-12-06 NOTE — ED Notes (Signed)
Pt been SOB for a week and a half with left-sided chest pain that radiates to left arm.  Pain reproducible with palpation.  Pt denies stress/anxiety.  Pt states nausea all last week.  Pt states having dizziness, headaches, and light headedness. Pt was told to stop taking bayer aspirin years ago.  Pt denies taking any medications to relieve the pain.

## 2013-01-01 ENCOUNTER — Inpatient Hospital Stay (HOSPITAL_COMMUNITY): Payer: Self-pay

## 2013-01-01 ENCOUNTER — Inpatient Hospital Stay (HOSPITAL_COMMUNITY)
Admission: AD | Admit: 2013-01-01 | Discharge: 2013-01-01 | Disposition: A | Discharge status: Hospice | Payer: Self-pay | Source: Ambulatory Visit | Attending: Obstetrics & Gynecology | Admitting: Obstetrics & Gynecology

## 2013-01-01 ENCOUNTER — Encounter (HOSPITAL_COMMUNITY): Payer: Self-pay | Admitting: Family

## 2013-01-01 DIAGNOSIS — R109 Unspecified abdominal pain: Secondary | ICD-10-CM | POA: Insufficient documentation

## 2013-01-01 DIAGNOSIS — D259 Leiomyoma of uterus, unspecified: Secondary | ICD-10-CM | POA: Insufficient documentation

## 2013-01-01 DIAGNOSIS — B9689 Other specified bacterial agents as the cause of diseases classified elsewhere: Secondary | ICD-10-CM | POA: Insufficient documentation

## 2013-01-01 DIAGNOSIS — M545 Low back pain, unspecified: Secondary | ICD-10-CM | POA: Insufficient documentation

## 2013-01-01 DIAGNOSIS — A499 Bacterial infection, unspecified: Secondary | ICD-10-CM | POA: Insufficient documentation

## 2013-01-01 DIAGNOSIS — D219 Benign neoplasm of connective and other soft tissue, unspecified: Secondary | ICD-10-CM

## 2013-01-01 DIAGNOSIS — N760 Acute vaginitis: Secondary | ICD-10-CM | POA: Insufficient documentation

## 2013-01-01 LAB — URINALYSIS, ROUTINE W REFLEX MICROSCOPIC
Bilirubin Urine: NEGATIVE
Hgb urine dipstick: NEGATIVE
Ketones, ur: NEGATIVE mg/dL
Nitrite: NEGATIVE
Specific Gravity, Urine: 1.02 (ref 1.005–1.030)
Urobilinogen, UA: 0.2 mg/dL (ref 0.0–1.0)

## 2013-01-01 LAB — WET PREP, GENITAL
Trich, Wet Prep: NONE SEEN
Yeast Wet Prep HPF POC: NONE SEEN

## 2013-01-01 LAB — CBC
Hemoglobin: 13.3 g/dL (ref 12.0–15.0)
MCH: 29.9 pg (ref 26.0–34.0)
MCHC: 35.5 g/dL (ref 30.0–36.0)
Platelets: 256 10*3/uL (ref 150–400)
RBC: 4.45 MIL/uL (ref 3.87–5.11)

## 2013-01-01 MED ORDER — METRONIDAZOLE 500 MG PO TABS: 500.0000 mg | ORAL_TABLET | Freq: Two times a day (BID) | ORAL | Status: DC

## 2013-01-01 MED ORDER — OXYCODONE-ACETAMINOPHEN 5-325 MG PO TABS
1.0000 | ORAL_TABLET | Freq: Once | ORAL | Status: AC
  Administered 2013-01-01: 1 via ORAL
  Filled 2013-01-01: qty 1

## 2013-01-01 NOTE — MAU Provider Note (Signed)
History     CSN: [id number]  Arrival date and time: 01/01/13 1255   First Provider Initiated Contact with Patient 01/01/13 1531      No chief complaint on file.  HPI [name] is a 52 y.o. G6P0 who presents to MAU today with complaint of lower abdominal pain. The patient states that she has had BTL many years ago. She has been having the lower abdominal pain and low back pain since yesterday. She denies N/V, fever, UTI symptoms or vaginal bleeding. The patient denies history of fibroids or cysts.    OB History   Grav Para Term Preterm Abortions TAB SAB Ect Mult Living   6         3      Past Medical History  Diagnosis Date  . Diabetes mellitus   . Hypertension   . Coronary artery disease   . Arthritis   . Hypokalemia     Past Surgical History  Procedure Laterality Date  . Hernia repair    . Appendectomy      History reviewed. No pertinent family history.  History  Substance Use Topics  . Smoking status: Current Every Day Smoker    Types: Cigarettes  . Smokeless tobacco: Not on file  . Alcohol Use: No    Allergies: No Known Allergies  No prescriptions prior to admission    Review of Systems  Constitutional: Positive for chills. Negative for fever.  Gastrointestinal: Positive for abdominal pain. Negative for nausea, vomiting, diarrhea and constipation.  Genitourinary: Negative for dysuria, urgency and frequency.       Neg - vaginal bleeding, discharge   Physical Exam   Blood pressure 130/82, pulse 88, temperature 97.8 F (36.6 C), temperature source Oral, resp. rate 18, height 5\' 2"  (1.575 m), weight 236 lb (107.049 kg), last menstrual period 12/14/2012.  Physical Exam  Constitutional: She is oriented to person, place, and time. She appears well-developed and well-nourished. No distress.  HENT:  Head: Normocephalic and atraumatic.  Cardiovascular: Normal rate, regular rhythm and normal heart sounds.   Respiratory: Effort normal and breath  sounds normal. No respiratory distress.  GI: Soft. Bowel sounds are normal. She exhibits no distension and no mass. There is tenderness (moderate tenderness to palpation of the lower abdomen more prominent on the RLQ). There is no rebound and no guarding.  Genitourinary: Uterus is not enlarged and not tender. Cervix exhibits no motion tenderness, no discharge and no friability. Right adnexum displays tenderness. Right adnexum displays no mass. Left adnexum displays no mass and no tenderness. No bleeding around the vagina. Vaginal discharge (small amount of thin, white malodorous discharge noted) found.  Neurological: She is alert and oriented to person, place, and time.  Skin: Skin is warm and dry. No erythema.  Psychiatric: She has a normal mood and affect.   Results for orders placed during the hospital encounter of 01/01/13 (from the past 24 hour(s))  URINALYSIS, ROUTINE W REFLEX MICROSCOPIC     Status: Abnormal   Collection Time    01/01/13  1:50 PM      Result Value Range   Color, Urine YELLOW  YELLOW   APPearance HAZY (*) CLEAR   Specific Gravity, Urine 1.020  1.005 - 1.030   pH 5.5  5.0 - 8.0   Glucose, UA 250 (*) NEGATIVE mg/dL   Hgb urine dipstick NEGATIVE  NEGATIVE   Bilirubin Urine NEGATIVE  NEGATIVE   Ketones, ur NEGATIVE  NEGATIVE mg/dL   Protein, ur  NEGATIVE  NEGATIVE mg/dL   Urobilinogen, UA 0.2  0.0 - 1.0 mg/dL   Nitrite NEGATIVE  NEGATIVE   Leukocytes, UA NEGATIVE  NEGATIVE  POCT PREGNANCY, URINE     Status: None   Collection Time    01/01/13  1:56 PM      Result Value Range   Preg Test, Ur NEGATIVE  NEGATIVE  CBC     Status: Abnormal   Collection Time    01/01/13  3:30 PM      Result Value Range   WBC 11.6 (*) 4.0 - 10.5 K/uL   RBC 4.45  3.87 - 5.11 MIL/uL   Hemoglobin 13.3  12.0 - 15.0 g/dL   HCT 11.9  14.7 - 82.9 %   MCV 84.3  78.0 - 100.0 fL   MCH 29.9  26.0 - 34.0 pg   MCHC 35.5  30.0 - 36.0 g/dL   RDW 56.2  13.0 - 86.5 %   Platelets 256  150 - 400 K/uL   WET PREP, GENITAL     Status: Abnormal   Collection Time    01/01/13  3:40 PM      Result Value Range   Yeast Wet Prep HPF POC NONE SEEN  NONE SEEN   Trich, Wet Prep NONE SEEN  NONE SEEN   Clue Cells Wet Prep HPF POC FEW (*) NONE SEEN   WBC, Wet Prep HPF POC FEW (*) NONE SEEN   US Transvaginal Non-ob  01/01/2013  *RADIOLOGY REPORT*  Clinical Data: Right lower quadrant pelvic pain  TRANSABDOMINAL AND TRANSVAGINAL ULTRASOUND OF PELVIS  Technique:  Both transabdominal and transvaginal ultrasound examinations of the pelvis were performed.  Transabdominal technique was performed for global imaging of the pelvis including uterus, ovaries, adnexal regions, and pelvic cul-de-sac.  It was necessary to proceed with endovaginal exam following the transabdominal exam to visualize the endometrium.  Comparison:  None.  Findings: Uterus:  Anteverted, anteflexed.  8.5 x 6.5 x 4.9 cm.  Posterior midbody intramural fibroid, 1.9 x 1.8 x 1.6 cm.  Fundal intramural fibroid, 2.4 x 1.8 x 1.7 cm.  Left lower uterine segment subserosal / intramural fibroid, 2.7 x 2.6 x 1.9 cm.  Endometrium: 1.4 cm.  Uniformly echogenic without focal abnormality.  Right ovary: Not visualized.  No adnexal mass.  Left ovary: 2.9 x 2.6 x 1.7 cm.  Normal.  Other Findings:  No free fluid  IMPRESSION: Uterine fibroids as above.  No acute abnormality.  The right ovary was not visualized but no adnexal mass is identified.   Original Report Authenticated By: Christiana Pellant, M.D.    US Pelvis Complete  01/01/2013  *RADIOLOGY REPORT*  Clinical Data: Right lower quadrant pelvic pain  TRANSABDOMINAL AND TRANSVAGINAL ULTRASOUND OF PELVIS  Technique:  Both transabdominal and transvaginal ultrasound examinations of the pelvis were performed.  Transabdominal technique was performed for global imaging of the pelvis including uterus, ovaries, adnexal regions, and pelvic cul-de-sac.  It was necessary to proceed with endovaginal exam following the transabdominal  exam to visualize the endometrium.  Comparison:  None.  Findings: Uterus:  Anteverted, anteflexed.  8.5 x 6.5 x 4.9 cm.  Posterior midbody intramural fibroid, 1.9 x 1.8 x 1.6 cm.  Fundal intramural fibroid, 2.4 x 1.8 x 1.7 cm.  Left lower uterine segment subserosal / intramural fibroid, 2.7 x 2.6 x 1.9 cm.  Endometrium: 1.4 cm.  Uniformly echogenic without focal abnormality.  Right ovary: Not visualized.  No adnexal mass.  Left ovary: 2.9 x 2.6 x  1.7 cm.  Normal.  Other Findings:  No free fluid  IMPRESSION: Uterine fibroids as above.  No acute abnormality.  The right ovary was not visualized but no adnexal mass is identified.   Original Report Authenticated By: Christiana Pellant, M.D.     MAU Course  Procedures None  MDM UA, Wet prep, GC/Chlamydia and [place] today  Assessment and Plan  A: Uterine Fibroids Bacterial vaginosis  P: Discharge home Rx for Flagyl sent to patient's pharmacy Patient encouraged to use Ibuprofen PRN for pain Patient has appointment at [facility] on 01/23/13 Patient encouraged to follow-up with PCP about DM Patient may return to MAU as needed or if her condition were to change or worsen  Freddi Starr, PA-C  01/01/2013, 6:14 PM

## 2013-01-01 NOTE — MAU Note (Signed)
Patient presents to MAU with c/o constant R lower abdominal progressively worsening for last 2 weeks. Reports LMP 12/14/12.  Reports she has hx of hernia repair surgery in 2009 with mesh. Reports she was supposed to have abdominal binder after surgery, although she never wore one.

## 2013-01-01 NOTE — MAU Note (Addendum)
Been having a lot of abd pain for past wk.  Can't get comfortable,  Has been nauseated. Tired all the time. Hx tubal in '91.  Pt had "periods" with her pregnancy, so is afraid tubal has failed.

## 2013-01-02 LAB — GC/CHLAMYDIA PROBE AMP
CT Probe RNA: NEGATIVE
GC Probe RNA: NEGATIVE

## 2013-01-05 NOTE — MAU Provider Note (Signed)
Attestation of Attending Supervision of Advanced Practitioner (CNM/NP): Evaluation and management procedures were performed by the Advanced Practitioner under my supervision and collaboration.  I have reviewed the Advanced Practitioner's note and chart, and I agree with the management and plan.  HARRAWAY-SMITH, Ireoluwa Grant 10:41 AM

## 2013-01-23 ENCOUNTER — Ambulatory Visit (INDEPENDENT_AMBULATORY_CARE_PROVIDER_SITE_OTHER): Payer: Self-pay | Admitting: Obstetrics & Gynecology

## 2013-01-23 ENCOUNTER — Encounter: Payer: Self-pay | Admitting: Obstetrics & Gynecology

## 2013-01-23 VITALS — BP 123/86 | HR 102 | Temp 97.7°F | Ht 62.0 in | Wt 237.0 lb

## 2013-01-23 DIAGNOSIS — D259 Leiomyoma of uterus, unspecified: Secondary | ICD-10-CM

## 2013-01-23 DIAGNOSIS — N946 Dysmenorrhea, unspecified: Secondary | ICD-10-CM

## 2013-01-23 DIAGNOSIS — N920 Excessive and frequent menstruation with regular cycle: Secondary | ICD-10-CM

## 2013-01-23 HISTORY — DX: Dysmenorrhea, unspecified: N94.6

## 2013-01-23 HISTORY — DX: Excessive and frequent menstruation with regular cycle: N92.0

## 2013-01-23 HISTORY — DX: Leiomyoma of uterus, unspecified: D25.9

## 2013-01-23 LAB — HEMOGLOBIN A1C: Hgb A1c MFr Bld: 7 % — ABNORMAL HIGH (ref <5.7)

## 2013-01-23 LAB — FOLLICLE STIMULATING HORMONE: FSH: 6.3 m[IU]/mL

## 2013-01-23 MED ORDER — ESTRADIOL 1 MG PO TABS: 1.0000 mg | ORAL_TABLET | Freq: Every day | ORAL | Status: DC

## 2013-01-23 MED ORDER — MEDROXYPROGESTERONE ACETATE 5 MG PO TABS: 5.0000 mg | ORAL_TABLET | Freq: Every day | ORAL | Status: DC

## 2013-01-23 NOTE — Progress Notes (Signed)
Patient ID: Anna Golden, female   DOB: 1961-04-23, 52 y.o.   MRN: [id number]  Chief Complaint  Patient presents with  . Fibroids    HPI BREUNA Golden is a 52 y.o. female.  [name] Patient's last menstrual period was 01/11/2013. [place] 5/20014 showed fibroids, she has increasing dysmenorrhea and menstrual flow. Some clots.   HPI  Past Medical History  Diagnosis Date  . Diabetes mellitus   . Hypertension   . Coronary artery disease   . Arthritis   . Hypokalemia   . Asthma   . Sickle cell trait     Past Surgical History  Procedure Laterality Date  . Hernia repair    . Appendectomy      Family History  Problem Relation Age of Onset  . Heart disease Mother   . Diabetes Mother   . Sickle cell anemia Mother   . Cancer Father     colon    Social History History  Substance Use Topics  . Smoking status: Current Every Day Smoker -- 0.25 packs/day    Types: Cigarettes  . Smokeless tobacco: Never Used  . Alcohol Use: No    No Known Allergies  Current Outpatient Prescriptions  Medication Sig Dispense Refill  . aspirin EC 81 MG tablet Take 81 mg by mouth daily.      Marland Kitchen guaifenesin (ROBITUSSIN) 100 MG/5ML syrup Take 200 mg by mouth 3 (three) times daily as needed for cough.      . naproxen sodium (ANAPROX) 220 MG tablet Take 220 mg by mouth 2 (two) times daily as needed (for arthritis).      Marland Kitchen estradiol (ESTRACE) 1 MG tablet Take 1 tablet (1 mg total) by mouth daily.  30 tablet  6  . medroxyPROGESTERone (PROVERA) 5 MG tablet Take 1 tablet (5 mg total) by mouth daily.  30 tablet  6  . metroNIDAZOLE (FLAGYL) 500 MG tablet Take 1 tablet (500 mg total) by mouth 2 (two) times daily.  14 tablet  0   No current facility-administered medications for this visit.    Review of Systems Review of Systems  Constitutional:       Hot flushes and sweats  Genitourinary: Positive for menstrual problem and pelvic pain. Negative for dysuria, frequency, vaginal bleeding and vaginal  discharge.  Skin: Negative for pallor.  Neurological: Positive for headaches.    Blood pressure 123/86, pulse 102, temperature 97.7 F (36.5 C), temperature source Oral, height 5\' 2"  (1.575 m), weight 237 lb (107.502 kg), last menstrual period 01/11/2013.  Physical Exam Physical Exam  Constitutional: She is oriented to person, place, and time. No distress.  obese  Pulmonary/Chest: Effort normal. No respiratory distress.  Abdominal: Soft. She exhibits no mass. There is no tenderness.  Genitourinary: Vagina normal.  uterus 6 weeks, mild tenderness no mass  Neurological: She is alert and oriented to person, place, and time.  Skin: Skin is warm and dry. No pallor.  Psychiatric: She has a normal mood and affect. Her behavior is normal.    Data Reviewed  *RADIOLOGY REPORT*  Clinical Data: Right lower quadrant pelvic pain  TRANSABDOMINAL AND TRANSVAGINAL ULTRASOUND OF PELVIS  Technique: Both transabdominal and transvaginal ultrasound  examinations of the pelvis were performed. Transabdominal  technique was performed for global imaging of the pelvis including  uterus, ovaries, adnexal regions, and pelvic cul-de-sac.  It was necessary to proceed with endovaginal exam following the  transabdominal exam to visualize the endometrium.  Comparison: None.  Findings:  Uterus: Anteverted,  anteflexed. 8.5 x 6.5 x 4.9 cm. Posterior  midbody intramural fibroid, 1.9 x 1.8 x 1.6 cm.  Fundal intramural fibroid, 2.4 x 1.8 x 1.7 cm.  Left lower uterine segment subserosal / intramural fibroid, 2.7 x  2.6 x 1.9 cm.  Endometrium: 1.4 cm. Uniformly echogenic without focal  abnormality.  Right ovary: Not visualized. No adnexal mass.  Left ovary: 2.9 x 2.6 x 1.7 cm. Normal.  Other Findings: No free fluid  IMPRESSION:  Uterine fibroids as above. No acute abnormality. The right ovary  was not visualized but no adnexal mass is identified.  Original Report Authenticated By: Christiana Pellant,  M.D.  Assessment    Fibroids, dysmenorrhea.Perimenopause. H/o diabetes not treated    Plan    Estrace, provera. Hb A1c, FSH. RTC 2 months. F/U at [facility]        Pallas Wahlert 01/23/2013, 10:28 AM

## 2013-01-23 NOTE — Patient Instructions (Signed)
Fibroids Fibroids are lumps (tumors) that can occur any place in a woman's body. These lumps are not cancerous. Fibroids vary in size, weight, and where they grow. HOME CARE  Do not take aspirin.  Write down the number of pads or tampons you use during your period. Tell your doctor. This can help determine the best treatment for you. GET HELP RIGHT AWAY IF:  You have pain in your lower belly (abdomen) that is not helped with medicine.  You have cramps that are not helped with medicine.  You have more bleeding between or during your period.  You feel lightheaded or pass out (faint).  Your lower belly pain gets worse. MAKE SURE YOU:  Understand these instructions.  Will watch your condition.  Will get help right away if you are not doing well or get worse. Document Released: 09/15/2010 Document Revised: 11/05/2011 Document Reviewed: 09/15/2010 [employer] Patient Information 2014 [employer].  

## 2013-02-02 ENCOUNTER — Telehealth: Payer: Self-pay

## 2013-02-02 NOTE — Telephone Encounter (Signed)
Called pt and informed her that she is the blood work confirms that she is premenopausal.   I asked pt if she has a PCP pt stated that the [facility] called and her gave her an appt to [facility] for [facility]/13 for diabetes management.  Pt did not have any other questions.

## 2013-02-02 NOTE — Telephone Encounter (Signed)
Message copied by Faythe Casa on Mon Feb 02, 2013 10:43 AM ------      Message from: Adam Phenix      Created: Fri Jan 30, 2013 10:08 PM       Premenopausal, diabetes needs PCP f/u ------

## 2013-02-03 ENCOUNTER — Ambulatory Visit: Payer: Self-pay

## 2013-02-19 ENCOUNTER — Ambulatory Visit: Payer: Self-pay

## 2013-04-20 ENCOUNTER — Ambulatory Visit: Payer: Self-pay | Attending: Family Medicine

## 2013-04-22 ENCOUNTER — Ambulatory Visit: Payer: No Typology Code available for payment source | Attending: Internal Medicine | Admitting: Internal Medicine

## 2013-04-22 VITALS — BP 134/87 | HR 100 | Temp 98.4°F | Resp 17 | Wt 244.4 lb

## 2013-04-22 DIAGNOSIS — I10 Essential (primary) hypertension: Secondary | ICD-10-CM | POA: Insufficient documentation

## 2013-04-22 DIAGNOSIS — E119 Type 2 diabetes mellitus without complications: Secondary | ICD-10-CM | POA: Insufficient documentation

## 2013-04-22 DIAGNOSIS — E1310 Other specified diabetes mellitus with ketoacidosis without coma: Secondary | ICD-10-CM

## 2013-04-22 DIAGNOSIS — E1110 Type 2 diabetes mellitus with ketoacidosis without coma: Secondary | ICD-10-CM

## 2013-04-22 LAB — CBC WITH DIFFERENTIAL/PLATELET
Basophils Absolute: 0 10*3/uL (ref 0.0–0.1)
Basophils Relative: 0 % (ref 0–1)
Eosinophils Relative: 2 % (ref 0–5)
HCT: 38.1 % (ref 36.0–46.0)
Lymphocytes Relative: 31 % (ref 12–46)
MCHC: 33.6 g/dL (ref 30.0–36.0)
MCV: 86.8 fL (ref 78.0–100.0)
Monocytes Absolute: 0.8 10*3/uL (ref 0.1–1.0)
RDW: 13.6 % (ref 11.5–15.5)

## 2013-04-22 LAB — COMPREHENSIVE METABOLIC PANEL
AST: 18 U/L (ref 0–37)
BUN: 8 mg/dL (ref 6–23)
Calcium: 9.2 mg/dL (ref 8.4–10.5)
Chloride: 102 mEq/L (ref 96–112)
Creat: 0.72 mg/dL (ref 0.50–1.10)
Glucose, Bld: 345 mg/dL — ABNORMAL HIGH (ref 70–99)

## 2013-04-22 LAB — LIPID PANEL: HDL: 41 mg/dL (ref >39)

## 2013-04-22 LAB — SEDIMENTATION RATE: Sed Rate: 18 mm/hr (ref 0–22)

## 2013-04-22 MED ORDER — GLUCOSE BLOOD VI STRP: ORAL_STRIP | Status: DC

## 2013-04-22 MED ORDER — FREESTYLE SYSTEM KIT: 1.0000 | PACK | Status: DC | PRN

## 2013-04-22 MED ORDER — METFORMIN HCL 500 MG PO TABS: 500.0000 mg | ORAL_TABLET | Freq: Two times a day (BID) | ORAL | Status: DC

## 2013-04-22 MED ORDER — GLIPIZIDE 5 MG PO TABS: 2.5000 mg | ORAL_TABLET | Freq: Two times a day (BID) | ORAL | Status: DC

## 2013-04-22 MED ORDER — METOPROLOL TARTRATE 25 MG PO TABS: 25.0000 mg | ORAL_TABLET | Freq: Every morning | ORAL | Status: DC

## 2013-04-22 NOTE — Progress Notes (Signed)
Patient here to establish care Has history of dm Heart problems Arthritis  siezures

## 2013-04-22 NOTE — Progress Notes (Signed)
Patient ID: Anna Golden, female   DOB: September 16, 1960, 52 y.o.   MRN: [id number]  CC:  HPI: 52 year old female who is here for establishing care. She has a history of diabetes and has not taken any of her medications for the last 8 months. Her CBGs greater than 300 today. The patient has been having intermittent chest pain and was in the ER on 4/12. She has since started taking a baby aspirin. She also complains of menorrhagia secondary to fibroids for which she is on hormonal treatment.  She has a history of seizure disorder and has been seizure-free   No Known Allergies Past Medical History  Diagnosis Date  . Diabetes mellitus   . Hypertension   . Coronary artery disease   . Arthritis   . Hypokalemia   . Asthma   . Sickle cell trait    Current Outpatient Prescriptions on File Prior to Visit  Medication Sig Dispense Refill  . aspirin EC 81 MG tablet Take 81 mg by mouth daily.      Marland Kitchen estradiol (ESTRACE) 1 MG tablet Take 1 tablet (1 mg total) by mouth daily.  30 tablet  6  . guaifenesin (ROBITUSSIN) 100 MG/5ML syrup Take 200 mg by mouth 3 (three) times daily as needed for cough.      . medroxyPROGESTERone (PROVERA) 5 MG tablet Take 1 tablet (5 mg total) by mouth daily.  30 tablet  6   No current facility-administered medications on file prior to visit.   Family History  Problem Relation Age of Onset  . Heart disease Mother   . Diabetes Mother   . Sickle cell anemia Mother   . Cancer Father     colon   History   Social History  . Marital Status: Legally Separated    Spouse Name: N/A    Number of Children: N/A  . Years of Education: N/A   Occupational History  . Not on file.   Social History Main Topics  . Smoking status: Current Every Day Smoker -- 0.25 packs/day    Types: Cigarettes  . Smokeless tobacco: Never Used  . Alcohol Use: No  . Drug Use: No  . Sexual Activity: Yes   Other Topics Concern  . Not on file   Social History Narrative  . No narrative on  file    Review of Systems  Constitutional: Negative for fever, chills, diaphoresis, activity change, appetite change and fatigue.  HENT: Negative for ear pain, nosebleeds, congestion, facial swelling, rhinorrhea, neck pain, neck stiffness and ear discharge.   Eyes: Negative for pain, discharge, redness, itching and visual disturbance.  Respiratory: Negative for cough, choking, chest tightness, shortness of breath, wheezing and stridor.   Cardiovascular: Negative for chest pain, palpitations and leg swelling.  Gastrointestinal: Negative for abdominal distention.  Genitourinary: Negative for dysuria, urgency, frequency, hematuria, flank pain, decreased urine volume, difficulty urinating and dyspareunia.  Musculoskeletal: Negative for back pain, joint swelling, arthralgias and gait problem.  Neurological: Negative for dizziness, tremors, seizures, syncope, facial asymmetry, speech difficulty, weakness, light-headedness, numbness and headaches.  Hematological: Negative for adenopathy. Does not bruise/bleed easily.  Psychiatric/Behavioral: Negative for hallucinations, behavioral problems, confusion, dysphoric mood, decreased concentration and agitation.    Objective:   Filed Vitals:   04/22/13 1000  BP: 134/87  Pulse: 100  Temp: 98.4 F (36.9 C)  Resp: 17    Physical Exam  Constitutional: Appears well-developed and well-nourished. No distress.  HENT: Normocephalic. External right and left ear normal. Oropharynx is clear  and moist.  Eyes: Conjunctivae and EOM are normal. PERRLA, no scleral icterus.  Neck: Normal ROM. Neck supple. No JVD. No tracheal deviation. No thyromegaly.  CVS: RRR, S1/S2 +, no murmurs, no gallops, no carotid bruit.  Pulmonary: Effort and breath sounds normal, no stridor, rhonchi, wheezes, rales.  Abdominal: Soft. BS +,  no distension, tenderness, rebound or guarding.  Musculoskeletal: Normal range of motion. No edema and no tenderness.  Lymphadenopathy: No  lymphadenopathy noted, cervical, inguinal. Neuro: Alert. Normal reflexes, muscle tone coordination. No cranial nerve deficit. Skin: Skin is warm and dry. No rash noted. Not diaphoretic. No erythema. No pallor.  Psychiatric: Normal mood and affect. Behavior, judgment, thought content normal.   Lab Results  Component Value Date   WBC 11.6* 01/01/2013   HGB 13.3 01/01/2013   HCT 37.5 01/01/2013   MCV 84.3 01/01/2013   PLT 256 01/01/2013   Lab Results  Component Value Date   CREATININE 0.67 12/06/2012   BUN 8 12/06/2012   NA 136 12/06/2012   K 3.7 12/06/2012   CL 101 12/06/2012   CO2 25 12/06/2012    Lab Results  Component Value Date   HGBA1C 8.7% 04/22/2013   Lipid Panel  No results found for this basename: chol, trig, hdl, cholhdl, vldl, ldlcalc       Assessment and plan:   Patient Active Problem List   Diagnosis Date Noted  . Leiomyoma of uterus, unspecified 01/23/2013  . Dysmenorrhea 01/23/2013  . Menorrhagia with regular cycle 01/23/2013   Diabetes Projecting hemoglobin A1c restarted the patient on metformin also started the patient on glipizide  Hypertension started the patient on metoprolol  Chest pain cardiology to follow provided she will need a 2-D echo and outpatient stress test patient advised to continue with aspirin we'll check a lipid panel  Followup in one week      The patient was given clear instructions to go to ER or return to medical center if symptoms don't improve, worsen or new problems develop. The patient verbalized understanding. The patient was told to call to get any lab results if not heard anything in the next week.

## 2013-04-23 LAB — TROPONIN I: Troponin I: <0.01 ng/mL (ref <0.06)

## 2013-04-23 LAB — TSH: TSH: 1.235 u[IU]/mL (ref 0.350–4.500)

## 2013-04-23 LAB — D-DIMER, QUANTITATIVE: D-Dimer, Quant: 0.5 ug/mL-FEU — ABNORMAL HIGH (ref 0.00–0.48)

## 2013-04-24 ENCOUNTER — Emergency Department (HOSPITAL_COMMUNITY): Payer: No Typology Code available for payment source

## 2013-04-24 ENCOUNTER — Inpatient Hospital Stay (HOSPITAL_COMMUNITY)
Admission: EM | Admit: 2013-04-24 | Discharge: 2013-04-25 | DRG: 310 | Disposition: A | Discharge status: Home / Self Care | Payer: No Typology Code available for payment source | Attending: Cardiology | Admitting: Cardiology

## 2013-04-24 ENCOUNTER — Encounter (HOSPITAL_COMMUNITY): Payer: Self-pay | Admitting: Adult Health

## 2013-04-24 DIAGNOSIS — R000 Tachycardia, unspecified: Secondary | ICD-10-CM | POA: Diagnosis present

## 2013-04-24 DIAGNOSIS — D72829 Elevated white blood cell count, unspecified: Secondary | ICD-10-CM | POA: Diagnosis present

## 2013-04-24 DIAGNOSIS — E1165 Type 2 diabetes mellitus with hyperglycemia: Secondary | ICD-10-CM | POA: Diagnosis present

## 2013-04-24 DIAGNOSIS — R079 Chest pain, unspecified: Secondary | ICD-10-CM | POA: Diagnosis present

## 2013-04-24 DIAGNOSIS — I480 Paroxysmal atrial fibrillation: Secondary | ICD-10-CM

## 2013-04-24 DIAGNOSIS — I2510 Atherosclerotic heart disease of native coronary artery without angina pectoris: Secondary | ICD-10-CM | POA: Diagnosis present

## 2013-04-24 DIAGNOSIS — I209 Angina pectoris, unspecified: Secondary | ICD-10-CM | POA: Diagnosis present

## 2013-04-24 DIAGNOSIS — E669 Obesity, unspecified: Secondary | ICD-10-CM | POA: Diagnosis present

## 2013-04-24 DIAGNOSIS — E119 Type 2 diabetes mellitus without complications: Secondary | ICD-10-CM | POA: Diagnosis present

## 2013-04-24 DIAGNOSIS — I4891 Unspecified atrial fibrillation: Principal | ICD-10-CM | POA: Diagnosis present

## 2013-04-24 DIAGNOSIS — M129 Arthropathy, unspecified: Secondary | ICD-10-CM | POA: Diagnosis present

## 2013-04-24 DIAGNOSIS — J45909 Unspecified asthma, uncomplicated: Secondary | ICD-10-CM | POA: Diagnosis present

## 2013-04-24 DIAGNOSIS — F17200 Nicotine dependence, unspecified, uncomplicated: Secondary | ICD-10-CM | POA: Diagnosis present

## 2013-04-24 DIAGNOSIS — I10 Essential (primary) hypertension: Secondary | ICD-10-CM | POA: Diagnosis present

## 2013-04-24 DIAGNOSIS — E1142 Type 2 diabetes mellitus with diabetic polyneuropathy: Secondary | ICD-10-CM | POA: Diagnosis present

## 2013-04-24 HISTORY — DX: Type 2 diabetes mellitus without complications: E11.9

## 2013-04-24 LAB — CBC WITH DIFFERENTIAL/PLATELET
HCT: 38.1 % (ref 36.0–46.0)
Hemoglobin: 13.6 g/dL (ref 12.0–15.0)
Lymphocytes Relative: 26 % (ref 12–46)
Lymphs Abs: 4.7 10*3/uL — ABNORMAL HIGH (ref 0.7–4.0)
Monocytes Absolute: 1.2 10*3/uL — ABNORMAL HIGH (ref 0.1–1.0)
Monocytes Relative: 6 % (ref 3–12)
Neutro Abs: 11.7 10*3/uL — ABNORMAL HIGH (ref 1.7–7.7)
Neutrophils Relative %: 66 % (ref 43–77)
RBC: 4.57 MIL/uL (ref 3.87–5.11)
WBC: 17.8 10*3/uL — ABNORMAL HIGH (ref 4.0–10.5)

## 2013-04-24 LAB — BASIC METABOLIC PANEL
BUN: 7 mg/dL (ref 6–23)
CO2: 23 mEq/L (ref 19–32)
Chloride: 97 mEq/L (ref 96–112)
Creatinine, Ser: 0.6 mg/dL (ref 0.50–1.10)
Potassium: 3.8 mEq/L (ref 3.5–5.1)

## 2013-04-24 LAB — TROPONIN I: Troponin I: <0.3 ng/mL (ref <0.30)

## 2013-04-24 MED ORDER — DILTIAZEM HCL 25 MG/5ML IV SOLN
25.0000 mg | Freq: Once | INTRAVENOUS | Status: AC
  Administered 2013-04-24: 25 mg via INTRAVENOUS

## 2013-04-24 MED ORDER — METFORMIN HCL 500 MG PO TABS
500.0000 mg | ORAL_TABLET | Freq: Two times a day (BID) | ORAL | Status: DC
  Administered 2013-04-25: 500 mg via ORAL
  Filled 2013-04-24 (×3): qty 1

## 2013-04-24 MED ORDER — METOPROLOL TARTRATE 1 MG/ML IV SOLN
2.5000 mg | Freq: Once | INTRAVENOUS | Status: DC
  Filled 2013-04-24: qty 5

## 2013-04-24 MED ORDER — SODIUM CHLORIDE 0.9 % IV BOLUS (SEPSIS)
1000.0000 mL | Freq: Once | INTRAVENOUS | Status: AC
  Administered 2013-04-24: 1000 mL via INTRAVENOUS

## 2013-04-24 MED ORDER — DILTIAZEM HCL 100 MG IV SOLR
5.0000 mg/h | INTRAVENOUS | Status: DC
  Administered 2013-04-25 (×2): 15 mg/h via INTRAVENOUS
  Filled 2013-04-24 (×3): qty 100

## 2013-04-24 MED ORDER — ASPIRIN EC 81 MG PO TBEC
81.0000 mg | DELAYED_RELEASE_TABLET | Freq: Every day | ORAL | Status: DC
  Filled 2013-04-24: qty 1

## 2013-04-24 MED ORDER — ESMOLOL HCL-SODIUM CHLORIDE 2000 MG/100ML IV SOLN
25.0000 ug/kg/min | Freq: Once | INTRAVENOUS | Status: DC
  Filled 2013-04-24: qty 100

## 2013-04-24 MED ORDER — MEDROXYPROGESTERONE ACETATE 5 MG PO TABS
5.0000 mg | ORAL_TABLET | Freq: Every day | ORAL | Status: DC
  Administered 2013-04-25: 5 mg via ORAL
  Filled 2013-04-24: qty 1

## 2013-04-24 MED ORDER — DILTIAZEM HCL 25 MG/5ML IV SOLN: 25.0000 mg | Freq: Once | INTRAVENOUS | Status: DC

## 2013-04-24 MED ORDER — ENOXAPARIN SODIUM 120 MG/0.8ML ~~LOC~~ SOLN
110.0000 mg | SUBCUTANEOUS | Status: DC
  Filled 2013-04-24: qty 0.8

## 2013-04-24 MED ORDER — ENOXAPARIN SODIUM 120 MG/0.8ML ~~LOC~~ SOLN
110.0000 mg | Freq: Two times a day (BID) | SUBCUTANEOUS | Status: DC
  Filled 2013-04-24 (×2): qty 0.8

## 2013-04-24 MED ORDER — ACETAMINOPHEN 325 MG PO TABS: 650.0000 mg | ORAL_TABLET | ORAL | Status: DC | PRN

## 2013-04-24 MED ORDER — ESTRADIOL 1 MG PO TABS
1.0000 mg | ORAL_TABLET | Freq: Every day | ORAL | Status: DC
  Administered 2013-04-25: 1 mg via ORAL
  Filled 2013-04-24: qty 1

## 2013-04-24 MED ORDER — ONDANSETRON HCL 4 MG/2ML IJ SOLN: 4.0000 mg | Freq: Four times a day (QID) | INTRAMUSCULAR | Status: DC | PRN

## 2013-04-24 MED ORDER — GLIPIZIDE 2.5 MG HALF TABLET
2.5000 mg | ORAL_TABLET | Freq: Two times a day (BID) | ORAL | Status: DC
  Administered 2013-04-25: 2.5 mg via ORAL
  Filled 2013-04-24 (×3): qty 1

## 2013-04-24 MED ORDER — DILTIAZEM HCL 100 MG IV SOLR
5.0000 mg/h | Freq: Once | INTRAVENOUS | Status: AC
  Administered 2013-04-24: 10 mg/h via INTRAVENOUS

## 2013-04-24 NOTE — ED Notes (Addendum)
Presents with onset of chest pain and "feeling like I was going to pass out, I couldn't breath, the pain went up to my neck and down both my arms" began 15 minutes ago while standing up. HR 180s-200s.

## 2013-04-24 NOTE — ED Notes (Addendum)
Pt used bedpan and HR rose to 200bpm, unable to decrease HR. Pt performing vagal maneuvers. EDP at bedside for reassessment. Pt alert and mentating well

## 2013-04-24 NOTE — ED Provider Notes (Signed)
CSN: [id number]     Arrival date & time 04/24/13  1950 History   First MD Initiated Contact with Patient 04/24/13 1956     Chief Complaint  Patient presents with  . Chest Pain   (Consider location/radiation/quality/duration/timing/severity/associated sxs/prior Treatment) Patient is a 52 y.o. female presenting with palpitations. The history is provided by the patient.  Palpitations Palpitations quality:  Irregular Onset quality:  Sudden Duration:  30 minutes Timing:  Constant Progression:  Unchanged Chronicity:  Recurrent Context comment:  Recently restarted on antihypertensives and antiglycemics Relieved by:  Nothing Worsened by:  Nothing tried Associated symptoms: chest pain, diaphoresis, dizziness, nausea and shortness of breath   Associated symptoms: no vomiting     Past Medical History  Diagnosis Date  . Diabetes mellitus   . Hypertension   . Coronary artery disease   . Arthritis   . Hypokalemia   . Asthma   . Sickle cell trait    Past Surgical History  Procedure Laterality Date  . Hernia repair    . Appendectomy     Family History  Problem Relation Age of Onset  . Heart disease Mother   . Diabetes Mother   . Sickle cell anemia Mother   . Cancer Father     colon   History  Substance Use Topics  . Smoking status: Current Every Day Smoker -- 0.25 packs/day    Types: Cigarettes  . Smokeless tobacco: Never Used  . Alcohol Use: No   OB History   Grav Para Term Preterm Abortions TAB SAB Ect Mult Living   6 4 1 3 2  2   3      Review of Systems  Constitutional: Positive for diaphoresis.  Respiratory: Positive for shortness of breath.   Cardiovascular: Positive for chest pain and palpitations.  Gastrointestinal: Positive for nausea. Negative for vomiting.  Neurological: Positive for dizziness.  All other systems reviewed and are negative.    Allergies  Review of patient's allergies indicates no known allergies.  Home Medications   Current Outpatient  Rx  Name  Route  Sig  Dispense  Refill  . aspirin EC 81 MG tablet   Oral   Take 81 mg by mouth daily.         Marland Kitchen estradiol (ESTRACE) 1 MG tablet   Oral   Take 1 tablet (1 mg total) by mouth daily.   30 tablet   6   . glipiZIDE (GLUCOTROL) 5 MG tablet   Oral   Take 0.5 tablets (2.5 mg total) by mouth 2 (two) times daily before a meal.   60 tablet   3   . glucose blood test strip      Use as instructed   100 each   12   . glucose monitoring kit (FREESTYLE) monitoring kit   Does not apply   1 each by Does not apply route as needed for other.   1 each   0   . guaifenesin (ROBITUSSIN) 100 MG/5ML syrup   Oral   Take 200 mg by mouth 3 (three) times daily as needed for cough.         . medroxyPROGESTERone (PROVERA) 5 MG tablet   Oral   Take 1 tablet (5 mg total) by mouth daily.   30 tablet   6   . metFORMIN (GLUCOPHAGE) 500 MG tablet   Oral   Take 1 tablet (500 mg total) by mouth 2 (two) times daily with a meal.   120 tablet  3   . metoprolol tartrate (LOPRESSOR) 25 MG tablet   Oral   Take 1 tablet (25 mg total) by mouth every morning.   180 tablet   3    Physical Exam  Nursing note and vitals reviewed. Constitutional: She is oriented to person, place, and time. She appears well-developed and well-nourished. No distress.  HENT:  Head: Normocephalic and atraumatic.  Mouth/Throat: Oropharynx is clear and moist.  Eyes: Conjunctivae are normal. Pupils are equal, round, and reactive to light. No scleral icterus.  Neck: Neck supple.  Cardiovascular: Normal heart sounds and intact distal pulses.  An irregularly irregular rhythm present. Tachycardia present.   No murmur heard. Pulmonary/Chest: Effort normal and breath sounds normal. No stridor. No respiratory distress. She has no rales.  Abdominal: Soft. Bowel sounds are normal. She exhibits no distension. There is no tenderness.  Musculoskeletal: Normal range of motion. She exhibits no edema.  Neurological: She  is alert and oriented to person, place, and time.  Skin: Skin is warm. No rash noted. She is diaphoretic.  Psychiatric: She has a normal mood and affect. Her behavior is normal.    ED Course  CRITICAL CARE Performed by: Blake Divine DAVID Authorized by: Blake Divine DAVID Total critical care time: 50 minutes Critical care time was exclusive of separately billable procedures and treating other patients. Critical care was necessary to treat or prevent imminent or life-threatening deterioration of the following conditions: circulatory failure and cardiac failure. Critical care was time spent personally by me on the following activities: development of treatment plan with patient or surrogate, discussions with consultants, evaluation of patient's response to treatment, examination of patient, obtaining history from patient or surrogate, ordering and performing treatments and interventions, ordering and review of laboratory studies, ordering and review of radiographic studies, pulse oximetry, re-evaluation of patient's condition and review of old charts.   (including critical care time) All labs drawn in ED reviewed.  Imaging Review  EKG - a fib, rate 183, normal axis, nonspecific ST/T changes, compared to prior a fib has replaced sinus rhythm and rate has increased.   EKG #2 - sinus tachy, rate 101, normal axis, normal intervals, no ST/T changes, compared to prior, now sinus rhythm.    Dg Chest Port 1 View  04/24/2013   CLINICAL DATA:  Palpitations, shortness of breath.  EXAM: PORTABLE CHEST - 1 VIEW  COMPARISON:  12/06/2012  FINDINGS: The heart size and mediastinal contours are within normal limits. Both lungs are clear. The visualized skeletal structures are unremarkable.  IMPRESSION: No active disease.   Electronically Signed   By: Charlett Nose   On: 04/24/2013 20:37  All radiology studies independently viewed by me.     MDM  1. Atrial fibrillation with rapid ventricular  response  Narrow tachycardia with rate 180's-200's.  EKG appears to be a fib.  Diltiazem given.    Required multiple doses of diltiazem without improvement in rate.  Metoprolol refused.  Ordered esmolol.  Pt eventually converted to sinus.  Cardiology has admitted.    Candyce Churn, MD 04/25/13 1115

## 2013-04-24 NOTE — Progress Notes (Signed)
ANTICOAGULATION CONSULT NOTE - Initial Consult  Pharmacy Consult for Lovenox Indication: atrial fibrillation  Allergies  Allergen Reactions  . Metoprolol Other (See Comments)    "Patient slept for whole day after starting medication this week.  Patient does not want to take this med.  Not sure if med causes hypotension, but could not get out of bed for any reason"  . Other Other (See Comments)    ANESTHESIA-CAUSES CARDIAC ARREST AND LUNGS COLLAPSED IN 1988, 2008.    Marland Kitchen Pork-Derived Products Palpitations and Other (See Comments)    Also raises Blood pressure-Patient avoids pork and pork products    Patient Measurements: Height: 5\' 2"  (157.5 cm) Weight: 244 lb 7.8 oz (110.9 kg) IBW/kg (Calculated) : 50.1  Vital Signs: BP: 132/79 mmHg (08/29 2130) Pulse Rate: 127 (08/29 2130)  Labs:  Recent Labs  04/22/13 1036 04/24/13 2020  HGB 12.8 13.6  HCT 38.1 38.1  PLT 318 299  CREATININE 0.72 0.60  TROPONINI <0.01 <0.30    Estimated Creatinine Clearance: 98.8 ml/min (by C-G formula based on Cr of 0.6).   Medical History: Past Medical History  Diagnosis Date  . Diabetes mellitus   . Hypertension   . Coronary artery disease   . Arthritis   . Hypokalemia   . Asthma   . Sickle cell trait     Assessment: 52yo c/o sudden onset of CP, noted to have HR to 200s, to begin Lovenox for Afib w/ RVR with plan to convert to oral therapy for long-term anticoagulation give risk factors.  Goal of Therapy:  Anti-Xa level 0.6-1.2 units/ml 4hrs after LMWH dose given Monitor platelets by anticoagulation protocol: Yes   Plan:  Will begin Lovenox 110mg  SQ Q12H and monitor CBC; f/u transition to long-term anticoag.  Vernard Gambles, PharmD, BCPS  04/24/2013,11:18 PM

## 2013-04-24 NOTE — ED Notes (Signed)
Cardiologist at bedside.  

## 2013-04-24 NOTE — ED Notes (Signed)
EKG completed in triage.

## 2013-04-24 NOTE — ED Notes (Addendum)
EDP at beside. Pt mentating well, alert and oriented.

## 2013-04-24 NOTE — H&P (Signed)
Anna Golden is an 52 y.o. female.   Chief Complaint: Palpitations HPI:  Anna Golden is a pleasant 52 year old female with a history of palpitations, hypertension, diabetes and obesity who presents today with sudden onset chest pain and heart racing. She was at the store today when she had a twisting sensation in her chest. This was associated with acute onset of racing heart, and shortness of breath. This has occurred in the past, but never this bad. She was told she had an arrhythmia when she was in high school and told to take a bayer aspirin. She was recommended to take a heart monitor in the past but was unable to afford one. She had chest pains with the racing heart. She saw her primary physician earlier this week and was started on metoprolol. She took one dose but felt drained of energy and stopped taking it. Today she was in AF with RVR when she arrived in the ED. A cardizem drip was started. The patient converted to sinus rhythm on her own. She did not develop hypotension or shock. She has been a diabetic for some time and is on two oral agents. She doesn't know if she has heart failure, but has Class 1 functional symptoms.    Past Medical History  Diagnosis Date  . Diabetes mellitus   . Hypertension   . Coronary artery disease   . Arthritis   . Hypokalemia   . Asthma   . Sickle cell trait     Past Surgical History  Procedure Laterality Date  . Hernia repair    . Appendectomy      Family History  Problem Relation Age of Onset  . Heart disease Mother   . Diabetes Mother   . Sickle cell anemia Mother   . Cancer Father     colon   Social History:  reports that she has been smoking Cigarettes.  She has been smoking about 0.25 packs per day. She has never used smokeless tobacco. She reports that she does not drink alcohol or use illicit drugs.  Allergies:  Allergies  Allergen Reactions  . Metoprolol Other (See Comments)    "Patient slept for whole day after starting  medication this week.  Patient does not want to take this med.  Not sure if med causes hypotension, but could not get out of bed for any reason"  . Other Other (See Comments)    ANESTHESIA-CAUSES CARDIAC ARREST AND LUNGS COLLAPSED IN 1988, 2008.    Marland Kitchen Pork-Derived Products Palpitations and Other (See Comments)    Also raises Blood pressure-Patient avoids pork and pork products     (Not in a hospital admission)  Results for orders placed during the hospital encounter of 04/24/13 (from the past 48 hour(s))  CBC WITH DIFFERENTIAL     Status: Abnormal   Collection Time    04/24/13  8:20 PM      Result Value Range   WBC 17.8 (*) 4.0 - 10.5 K/uL   RBC 4.57  3.87 - 5.11 MIL/uL   Hemoglobin 13.6  12.0 - 15.0 g/dL   HCT 40.9  81.1 - 91.4 %   MCV 83.4  78.0 - 100.0 fL   MCH 29.8  26.0 - 34.0 pg   MCHC 35.7  30.0 - 36.0 g/dL   RDW 78.2  95.6 - 21.3 %   Platelets 299  150 - 400 K/uL   Neutrophils Relative % 66  43 - 77 %   Neutro Abs  11.7 (*) 1.7 - 7.7 K/uL   Lymphocytes Relative 26  12 - 46 %   Lymphs Abs 4.7 (*) 0.7 - 4.0 K/uL   Monocytes Relative 6  3 - 12 %   Monocytes Absolute 1.2 (*) 0.1 - 1.0 K/uL   Eosinophils Relative 1  0 - 5 %   Eosinophils Absolute 0.2  0.0 - 0.7 K/uL   Basophils Relative 0  0 - 1 %   Basophils Absolute 0.0  0.0 - 0.1 K/uL  BASIC METABOLIC PANEL     Status: Abnormal   Collection Time    04/24/13  8:20 PM      Result Value Range   Sodium 136  135 - 145 mEq/L   Potassium 3.8  3.5 - 5.1 mEq/L   Chloride 97  96 - 112 mEq/L   CO2 23  19 - 32 mEq/L   Glucose, Bld 319 (*) 70 - 99 mg/dL   BUN 7  6 - 23 mg/dL   Creatinine, Ser 4.09  0.50 - 1.10 mg/dL   Calcium 9.7  8.4 - 81.1 mg/dL   GFR calc non Af Amer >90  >90 mL/min   GFR calc Af Amer >90  >90 mL/min   Comment: (NOTE)     The eGFR has been calculated using the CKD EPI equation.     This calculation has not been validated in all clinical situations.     eGFR's persistently <90 mL/min signify possible  Chronic Kidney     Disease.  TROPONIN I     Status: None   Collection Time    04/24/13  8:20 PM      Result Value Range   Troponin I <0.30  <0.30 ng/mL   Comment:            Due to the release kinetics of cTnI,     a negative result within the first hours     of the onset of symptoms does not rule out     myocardial infarction with certainty.     If myocardial infarction is still suspected,     repeat the test at appropriate intervals.   Dg Chest Port 1 View  04/24/2013   CLINICAL DATA:  Palpitations, shortness of breath.  EXAM: PORTABLE CHEST - 1 VIEW  COMPARISON:  12/06/2012  FINDINGS: The heart size and mediastinal contours are within normal limits. Both lungs are clear. The visualized skeletal structures are unremarkable.  IMPRESSION: No active disease.   Electronically Signed   By: Charlett Nose   On: 04/24/2013 20:37    Review of Systems  Constitutional: Negative.   HENT: Negative.   Eyes: Negative.   Respiratory: Negative.   Cardiovascular: Positive for chest pain and palpitations.  Gastrointestinal: Negative.   Genitourinary: Negative.   Musculoskeletal: Negative.   Skin: Negative.   Neurological: Negative.   Endo/Heme/Allergies: Negative.   Psychiatric/Behavioral: Negative.   All other systems reviewed and are negative.    Blood pressure 132/79, pulse 127, resp. rate 19, weight 244 lb 7.8 oz (110.9 kg), SpO2 100.00%. Physical Exam  Vitals reviewed. Constitutional: She is oriented to person, place, and time. She appears well-developed and well-nourished. No distress.  Neck: Neck supple. No JVD present. No thyromegaly present.  Cardiovascular: Normal rate, regular rhythm and intact distal pulses.   No murmur heard. Respiratory: Effort normal and breath sounds normal.  GI: Soft. Bowel sounds are normal.  Musculoskeletal: She exhibits no edema.  Neurological: She is alert and oriented to  person, place, and time.  Skin: She is not diaphoretic.      Assessment/Plan This is a pleasant 52 year old female with a history of some type of arrhythmia who presents tonight with AF and RVR. She has multiple risk factors for embolic stroke: hypertension, and diabetes. Its unclear if she has heart failure, but she doesn't describe any symptoms of heart failure. She has already converted to sinus rhythm on diltiazem. She will need to be converted to an oral therapy. We don't know if she has any underlying heart disease. Her chest pain was likely rate related.    Atrial fibrillation with rapid ventricular reponse, now paroxysmal- Continue diltiazem drip. Will start lovenox. Check TSH. Will need echo, possibly a stress test.   Chest pain- likely rate related.   Hypertension- controlled at this time.   Anne Ng C 04/24/2013, 10:28 PM

## 2013-04-24 NOTE — ED Notes (Addendum)
Pt converted into Sinus Tach rhythm at 2147. EDP made aware. EKG taken. Pt reports pain has greatly improved, states her neck is still feeling mildly "tight".

## 2013-04-24 NOTE — ED Notes (Signed)
EDP at bedside  

## 2013-04-25 ENCOUNTER — Encounter (HOSPITAL_COMMUNITY): Payer: Self-pay | Admitting: *Deleted

## 2013-04-25 ENCOUNTER — Other Ambulatory Visit: Payer: Self-pay | Admitting: Physician Assistant

## 2013-04-25 DIAGNOSIS — I10 Essential (primary) hypertension: Secondary | ICD-10-CM

## 2013-04-25 DIAGNOSIS — E119 Type 2 diabetes mellitus without complications: Secondary | ICD-10-CM

## 2013-04-25 DIAGNOSIS — I4891 Unspecified atrial fibrillation: Secondary | ICD-10-CM

## 2013-04-25 DIAGNOSIS — R079 Chest pain, unspecified: Secondary | ICD-10-CM

## 2013-04-25 LAB — GLUCOSE, CAPILLARY
Glucose-Capillary: 170 mg/dL — ABNORMAL HIGH (ref 70–99)
Glucose-Capillary: 192 mg/dL — ABNORMAL HIGH (ref 70–99)

## 2013-04-25 LAB — LIPID PANEL
HDL: 48 mg/dL (ref >39)
LDL Cholesterol: 43 mg/dL (ref 0–99)

## 2013-04-25 LAB — BASIC METABOLIC PANEL
BUN: 6 mg/dL (ref 6–23)
Calcium: 8.9 mg/dL (ref 8.4–10.5)
Creatinine, Ser: 0.56 mg/dL (ref 0.50–1.10)
GFR calc non Af Amer: >90 mL/min (ref >90)
Glucose, Bld: 179 mg/dL — ABNORMAL HIGH (ref 70–99)

## 2013-04-25 LAB — PROTIME-INR: Prothrombin Time: 12.8 seconds (ref 11.6–15.2)

## 2013-04-25 LAB — CBC
MCH: 29.4 pg (ref 26.0–34.0)
MCHC: 35.2 g/dL (ref 30.0–36.0)
Platelets: 270 10*3/uL (ref 150–400)

## 2013-04-25 MED ORDER — RIVAROXABAN 20 MG PO TABS
20.0000 mg | ORAL_TABLET | Freq: Every day | ORAL | Status: DC
  Filled 2013-04-25: qty 1

## 2013-04-25 MED ORDER — RIVAROXABAN 20 MG PO TABS: 20.0000 mg | ORAL_TABLET | Freq: Every day | ORAL | Status: DC

## 2013-04-25 MED ORDER — RIVAROXABAN 20 MG PO TABS
20.0000 mg | ORAL_TABLET | Freq: Every day | ORAL | Status: DC
  Administered 2013-04-25: 20 mg via ORAL
  Filled 2013-04-25: qty 1

## 2013-04-25 MED ORDER — DILTIAZEM HCL ER COATED BEADS 120 MG PO CP24: 120.0000 mg | ORAL_CAPSULE | Freq: Every day | ORAL | Status: DC

## 2013-04-25 MED ORDER — DILTIAZEM HCL ER COATED BEADS 120 MG PO CP24
120.0000 mg | ORAL_CAPSULE | Freq: Every day | ORAL | Status: DC
  Administered 2013-04-25: 120 mg via ORAL
  Filled 2013-04-25: qty 1

## 2013-04-25 NOTE — Progress Notes (Signed)
04/25/2013 11:40 AM Nursing note Confirmed with pharmacy to given PO Diltiazem and then turn off gtt one hour after. Pt. Updated on plan. Orders enacted. Will continue to monitor.  Javier Gell, Blanchard Kelch

## 2013-04-25 NOTE — Progress Notes (Signed)
04/25/2013 1100 Nursing note  Pt. Expressing concerns with headache while on Diltiazem infusion. Spoke with Ronie Spies PAC with [place]. Verbal orders ok to give PO Diltiazem as ordered. Pt. Updated on plan. Will continue to monitor.  Leoni Goodness, Blanchard Kelch

## 2013-04-25 NOTE — Discharge Summary (Signed)
Discharge Summary   Patient ID: Anna Golden MRN: [id number], DOB/AGE: 50/24/1962 50 y.o. Admit date: 04/24/2013 D/C date:     04/25/2013  Primary Cardiologist: Remotely Dr. Daleen Squibb  Primary Discharge Diagnoses:  1. Newly diagnosed atrial fibrillation with RVR - spont converted on IV diltiazem 2. Chest pain associated with above - for outpt nuclear stress test 3. Type 2 diabetes mellitus 4. HTN 5. Leukocytosis, felt likely stress response - instructed to f/u PCP given history of elevated level  Secondary Discharge Diagnoses:  1. Sickle cell trait 2. Arthritis 3. H/o hypokalemia 4. Asthma  Hospital Course:  Anna Golden is a 52 y/o F with history of of palpitations, hypertension, diabetes and obesity who presented to [facility]24/2013 with sudden onset chest pain and heart racing. On day of admission, she was at the store when she had a twisting sensation in her chest. This was associated with acute onset of racing heart, and shortness of breath. This has occurred in the past, but never this bad. She was told she had an arrhythmia when she was in high school and told to take a bayer aspirin. She was recommended to take a heart monitor in the past but was unable to afford one. She had chest pains with the racing heart. She saw her primary physician earlier this week and was started on metoprolol but developed profound lack of energy with it so discontinued this. Because of the palpitations she presented to the ER where she was found to be in atrial fib with RVR. A cardizem drip was started and the patient converted to sinus rhythm on her own. She did not develop hypotension or shock. She has been a diabetic for some time and is on two oral agents. CXR unremarkable. Diltiazem drip was converted to oral form and she remained in NSR. Lovenox was initiated and she was later changed to Xarelto 20mg  daily (CHADSVASC score 3). Dr. Diona Browner stopped her aspirin. Her troponin was normal and ECG in sinus rhythm  showed no acute ST segment changes. The patient did have a leukocytosis on admission which improved, felt likely due to stress response. However, most of her prior CBCs show elevated WBC so she was instructed to discuss with PCP. Recent TSH was normal. 2D Echo showed EF 60-65%, no RWMA, grade 2 diastolic dysfunction. Dr. Diona Browner has seen and examined the patient today and feels she is stable for discharge. He recommends outpatient [facility], then f/u appt. I have left a message on our office's scheduling voicemail requesting these follow-up appointments, and our office will call her. I also left msg to be plugged into anticoag clinic.  Discharge Vitals: Blood pressure 134/94, pulse 96, temperature 99.6 F (37.6 C), temperature source Oral, resp. rate 20, height 5\' 2"  (1.575 m), weight 244 lb 12.8 oz (111.041 kg), SpO2 100.00%.  Labs: Lab Results  Component Value Date   WBC 11.8* 04/25/2013   HGB 12.4 04/25/2013   HCT 35.2* 04/25/2013   MCV 83.4 04/25/2013   PLT 270 04/25/2013    Recent Labs Lab 04/22/13 1036  04/25/13 0500  NA 136  < > 140  K 3.8  < > 3.6  CL 102  < > 104  CO2 24  < > 25  BUN 8  < > 6  CREATININE 0.72  < > 0.56  CALCIUM 9.2  < > 8.9  PROT 6.7  --   --   BILITOT 0.2*  --   --   ALKPHOS 33*  --   --  ALT 21  --   --   AST 18  --   --   GLUCOSE 345*  < > 179*  < > = values in this interval not displayed.  Recent Labs  04/24/13 2020  TROPONINI <0.30     Diagnostic Studies/Procedures   Dg Chest Port 1 View  04/24/2013   CLINICAL DATA:  Palpitations, shortness of breath.  EXAM: PORTABLE CHEST - 1 VIEW  COMPARISON:  12/06/2012  FINDINGS: The heart size and mediastinal contours are within normal limits. Both lungs are clear. The visualized skeletal structures are unremarkable.  IMPRESSION: No active disease.   Electronically Signed   By: Charlett Nose   On: 04/24/2013 20:37   2D Echo 04/25/13 Left ventricle: The cavity size was normal. Wall thickness was  normal. Systolic function was normal. The estimated ejection fraction was in the range of 60% to 65%. Wall motion was normal; there were no regional wall motion abnormalities. Features are consistent with a pseudonormal left ventricular filling pattern, with concomitant abnormal relaxation and increased filling pressure (grade 2 diastolic dysfunction).   Discharge Medications     Medication List    STOP taking these medications       aspirin EC 81 MG tablet     metoprolol tartrate 25 MG tablet  Commonly known as:  LOPRESSOR      TAKE these medications       diltiazem 120 MG 24 hr capsule  Commonly known as:  CARDIZEM CD  Take 1 capsule (120 mg total) by mouth daily.     estradiol 1 MG tablet  Commonly known as:  ESTRACE  Take 1 tablet (1 mg total) by mouth daily.     glipiZIDE 5 MG tablet  Commonly known as:  GLUCOTROL  Take 0.5 tablets (2.5 mg total) by mouth 2 (two) times daily before a meal.     medroxyPROGESTERone 5 MG tablet  Commonly known as:  PROVERA  Take 1 tablet (5 mg total) by mouth daily.     metFORMIN 500 MG tablet  Commonly known as:  GLUCOPHAGE  Take 1 tablet (500 mg total) by mouth 2 (two) times daily with a meal.     neomycin-bacitracin-polymyxin ointment  Commonly known as:  NEOSPORIN  Apply 1 application topically daily as needed (for excema).     OVER THE COUNTER MEDICATION  Apply 1 application topically daily as needed (for excema).     Rivaroxaban 20 MG Tabs tablet  Commonly known as:  XARELTO  Take 1 tablet (20 mg total) by mouth daily with supper.        Disposition   The patient will be discharged in stable condition to home. Discharge Orders   Future Appointments Provider Department Dept Phone   04/29/2013 3:45 PM [facility] Covering Provider [facility] Flores [phone]   05/28/2013 2:45 PM Wendall Stade, MD [place] [facility] Main Office [place]) [phone]   Future Orders Complete By Expires   Diet -  low sodium heart healthy  As directed    Discharge instructions  As directed    Comments:     If you notice any unusual bleeding, call your doctor.   Increase activity slowly  As directed      Follow-up Information   Follow up with  [facility]. (Our office will call you for your stress test date and a follow-up appointment. Please call the office if you have not heard from [place] within 3 days.)    Contact information:  [address] [place] [address] [phone]       Follow up with Primary Care Doctor. (Your white blood cell count was slightly elevated. It has improved. This may be due to the atrial fibrillation, but please discuss with primary doctor about rechecking to make sure it returns to normal. It has also been elevated in the past.)         Duration of Discharge Encounter: Greater than 30 minutes including physician and PA time.  Signed, Kaislee Chao PA-C 04/25/2013, 1:58 PM

## 2013-04-25 NOTE — Progress Notes (Signed)
04/25/2013 8:18 AM Nursing note Upon assessment this morning pt. C/o severe headache. BP 125/85 HR 85 NSR. Pt. Feels that it may be the Cardizem that is "causing this problem". Pt. Requested that Cardizem gtt be decreased since she was now in NSR. Ronie Spies [facility] paged and made aware. Verbal orders received ok to decrease Cardizem gtt to 10mg  per hour for 15 min and if HR is stable decrease to 5mg /hr as pt. Tolerates. Orders enacted. Pt. Updated on plan and verbalized understanding.  Leotta Weingarten, Blanchard Kelch

## 2013-04-25 NOTE — Progress Notes (Signed)
04/25/2013 0915 Nursing note Upon re-assessment after titration of Cardizem gtt to 5mg /hr per verbal orders, pt. States headache has completely resolved. Will continue to monitor patient.  Brena Windsor, Blanchard Kelch

## 2013-04-25 NOTE — Progress Notes (Signed)
  Echocardiogram 2D Echocardiogram has been performed.  Georgian Co 04/25/2013, 9:36 AM

## 2013-04-25 NOTE — Progress Notes (Addendum)
04/25/2013 3:51 PM Nursing note Discharge avs form, medications already taken today and those due this evening given and explained to patient and family. Location of called in Rx explained to patient. Follow up appointments and when to call MD reviewed. Questions and concerns addressed. D/c iv line. D/c tele. D/c home per orders. Pt. Still c/o intermittent headache and "different feeling in chest". Pt. Still noted to be in NSR with occasional PAC on monitor. Dayna Dunn PaC on floor and made aware. Pt. Had ambulated multiple times in room and once in hallway with no complaints. Pt. Also had 8 beats of questionable wide complex on telemetry. Dayna Dunna PAC also on floor and shown strip. PAC states it is artifact. Verbal orders ok to still d/c pt. Home. D/c home per orders.  Anna Golden, Blanchard Kelch

## 2013-04-25 NOTE — Progress Notes (Signed)
Primary cardiologist: Dr. Rollene Rotunda (admitting)   Subjective:   Feels much better this morning. No chest pain or palpitations. No shortness of breath.   Objective:   Temp:  [98.4 F (36.9 C)-99 F (37.2 C)] 99 F (37.2 C) (08/30 0417) Pulse Rate:  [71-140] 85 (08/30 0811) Resp:  [12-26] 21 (08/30 0417) BP: (96-155)/(62-89) 125/85 mmHg (08/30 0811) SpO2:  [95 %-100 %] 97 % (08/30 0417) Weight:  [244 lb 7.8 oz (110.9 kg)-244 lb 12.8 oz (111.041 kg)] 244 lb 12.8 oz (111.041 kg) (08/30 0005) Last BM Date: 04/24/13  [facility] Weights   04/24/13 2130 04/25/13 0005  Weight: 244 lb 7.8 oz (110.9 kg) 244 lb 12.8 oz (111.041 kg)    Intake/Output Summary (Last 24 hours) at 04/25/13 1007 Last data filed at 04/25/13 0006  Gross per 24 hour  Intake      0 ml  Output    800 ml  Net   -800 ml   Telemetry: Sinus rhythm.  Exam:  General: NAD.  Lungs: Clear, nonlabored.  Cardiac: RRR, no gallop.  Abdomen: NABS.  Extremities: No pitting.  Lab Results:  Basic Metabolic Panel:  Recent Labs Lab 04/22/13 1036 04/24/13 2020 04/25/13 0500  NA 136 136 140  K 3.8 3.8 3.6  CL 102 97 104  CO2 24 23 25   GLUCOSE 345* 319* 179*  BUN 8 7 6   CREATININE 0.72 0.60 0.56  CALCIUM 9.2 9.7 8.9    Liver Function Tests:  Recent Labs Lab 04/22/13 1036  AST 18  ALT 21  ALKPHOS 33*  BILITOT 0.2*  PROT 6.7  ALBUMIN 3.8    CBC:  Recent Labs Lab 04/22/13 1036 04/24/13 2020 04/25/13 0500  WBC 11.5* 17.8* 11.8*  HGB 12.8 13.6 12.4  HCT 38.1 38.1 35.2*  MCV 86.8 83.4 83.4  PLT 318 299 270    Cardiac Enzymes:  Recent Labs Lab 04/22/13 1036 04/24/13 2020  TROPONINI <0.01 <0.30    Coagulation:  Recent Labs Lab 04/25/13 0500  INR 0.98    ECG: Normal sinus rhythm, no acute ST segment changes.   Medications:   Scheduled Medications: . aspirin EC  81 mg Oral Daily  . enoxaparin (LOVENOX) injection  110 mg Subcutaneous NOW  . enoxaparin (LOVENOX) injection   110 mg Subcutaneous Q12H  . estradiol  1 mg Oral Daily  . glipiZIDE  2.5 mg Oral BID AC  . medroxyPROGESTERone  5 mg Oral Daily  . metFORMIN  500 mg Oral BID WC     Infusions: . diltiazem (CARDIZEM) infusion 5 mg/hr (04/25/13 0845)     PRN Medications:  acetaminophen, ondansetron (ZOFRAN) IV   Assessment:   1. New diagnosis of atrial fibrillation with rapid ventricular response. Does have history of palpitations over time. She has spontaneously converted to sinus rhythm on intravenous Cardizem. CHADS2 score 2 and CHADSVASC score 3. No reported major bleeding episodes.  2. Chest pain associated with above arrhythmia. This has resolved. Cardiac markers are normal, and ECG in sinus rhythm shows no acute ST segment changes.  3. Type 2 diabetes mellitus.  4. Hypertension.  5. Leukocytosis, afebrile, normal chest x-ray. Possibly stress response.   Plan/Discussion:    Start Cardizem CD 120 mg daily, discontinue Cardizem infusion. We discussed merits of anticoagulation for stroke risk reduction, she is in agreement with initiation of Xarelto - can start 20 mg daily (stopping Lovenox).Marland Kitchen She will need to stop her aspirin. Will have her ambulate in the hall, and if she  remains clinically stable, she can go home this afternoon. Followup echocardiogram and Lexiscan Myoview to be arranged in our office over the next week, then she will need an office visit with Dr. Antoine Poche or APP in 2 weeks.    Jonelle Sidle, M.D., F.A.C.C.

## 2013-04-26 NOTE — Discharge Summary (Signed)
Please also refer to my rounding note. 

## 2013-04-26 NOTE — Care Management Note (Signed)
    Page 1 of 1   04/26/2013     6:28:17 PM   CARE MANAGEMENT NOTE 04/26/2013  Patient:  SIRA, ADSIT   Account Number:  [id number]  Date Initiated:  04/26/2013  Documentation initiated by:  [facility]  Subjective/Objective Assessment:     Action/Plan:   Anticipated DC Date:     Anticipated DC Plan:        DC Planning Services  Medication Assistance      Choice offered to / List presented to:             Status of service:   Medicare Important Message given?   (If response is "NO", the following Medicare IM given date fields will be blank) Date Medicare IM given:   Date Additional Medicare IM given:    Discharge Disposition:  HOME/SELF CARE  Per UR Regulation:    If discussed at Long Length of Stay Meetings, dates discussed:    Comments:  04/26/13 18:00 CM received call from RN who had discharged pt yesterday, went to her pharmacy for Xarelto and could not afford it.  Pt called RN who called me.  I called the pt at home [phone] and walked her through the steps to activate her Xarelto 30-day free trial.  She then planned to go back to her pharmacy which she stated would still be open for her medication.  Freddy Jaksch, BSN, CM [phone].

## 2013-04-29 ENCOUNTER — Emergency Department (HOSPITAL_COMMUNITY)
Admission: EM | Admit: 2013-04-29 | Discharge: 2013-04-29 | Discharge status: Against Medical Advice | Payer: No Typology Code available for payment source | Attending: Emergency Medicine | Admitting: Emergency Medicine

## 2013-04-29 ENCOUNTER — Ambulatory Visit: Payer: No Typology Code available for payment source | Attending: Internal Medicine | Admitting: Internal Medicine

## 2013-04-29 ENCOUNTER — Encounter (HOSPITAL_COMMUNITY): Payer: Self-pay | Admitting: Emergency Medicine

## 2013-04-29 ENCOUNTER — Telehealth: Payer: Self-pay | Admitting: Family Medicine

## 2013-04-29 VITALS — BP 124/69 | HR 106 | Temp 98.7°F | Resp 16 | Ht 63.0 in | Wt 241.2 lb

## 2013-04-29 DIAGNOSIS — Z23 Encounter for immunization: Secondary | ICD-10-CM

## 2013-04-29 DIAGNOSIS — I10 Essential (primary) hypertension: Secondary | ICD-10-CM | POA: Insufficient documentation

## 2013-04-29 DIAGNOSIS — J45909 Unspecified asthma, uncomplicated: Secondary | ICD-10-CM | POA: Insufficient documentation

## 2013-04-29 DIAGNOSIS — I4891 Unspecified atrial fibrillation: Secondary | ICD-10-CM

## 2013-04-29 DIAGNOSIS — E119 Type 2 diabetes mellitus without complications: Secondary | ICD-10-CM | POA: Insufficient documentation

## 2013-04-29 DIAGNOSIS — IMO0002 Reserved for concepts with insufficient information to code with codable children: Secondary | ICD-10-CM | POA: Insufficient documentation

## 2013-04-29 DIAGNOSIS — F17200 Nicotine dependence, unspecified, uncomplicated: Secondary | ICD-10-CM | POA: Insufficient documentation

## 2013-04-29 MED ORDER — GLIPIZIDE 5 MG PO TABS: 5.0000 mg | ORAL_TABLET | Freq: Two times a day (BID) | ORAL | Status: DC

## 2013-04-29 MED ORDER — SULFAMETHOXAZOLE-TMP DS 800-160 MG PO TABS: 1.0000 | ORAL_TABLET | Freq: Two times a day (BID) | ORAL | Status: DC

## 2013-04-29 NOTE — Progress Notes (Signed)
PT is here for follow up visit.

## 2013-04-29 NOTE — Progress Notes (Signed)
Patient ID: GLADIE GRAVETTE, female   DOB: October 09, 1960, 52 y.o.   MRN: [id number]  Patient Demographics  Anna Golden, is a 52 y.o. female  [id number]  [id number]  DOB - [birth year]  Chief Complaint  Patient presents with  . Follow-up        Subjective:   Anna Golden with History of DM type II, atrial fibrillation recently diagnosed, hypertension, morbid obesity is here for followup visit, no complaints except a boil under her left axilla for the last 3 foot is draining mild pus, no fever chills no chest abdominal pain, does have some diarrhea since she started metformin.  Denies any subjective complaints except as above, no active headache, no chest abdominal pain at this time, not short of breath. No focal weakness which is new.   Objective:    Patient Active Problem List   Diagnosis Date Noted  . PAF (paroxysmal atrial fibrillation) 04/24/2013  . Anginal pain 04/24/2013  . DM (diabetes mellitus) 04/24/2013  . Hypertension 04/24/2013  . Obesity 04/24/2013  . Leiomyoma of uterus, unspecified 01/23/2013  . Dysmenorrhea 01/23/2013  . Menorrhagia with regular cycle 01/23/2013     Filed Vitals:   04/29/13 1636  BP: 124/69  Pulse: 106  Temp: 98.7 F (37.1 C)  TempSrc: Oral  Resp: 16  Height: 5\' 3"  (1.6 m)  Weight: 241 lb 3.2 oz (109.408 kg)  SpO2: 97%     Exam   Awake Alert, Oriented X 3, No new F.N deficits, Normal affect [place].AT,PERRAL Supple Neck,No JVD, No cervical lymphadenopathy appriciated.  Symmetrical Chest wall movement, Good air movement bilaterally, CTAB RRR,No Gallops,Rubs or new Murmurs, No Parasternal Heave +ve B.Sounds, Abd Soft, Non tender, No organomegaly appriciated, No rebound - guarding or rigidity. No Cyanosis, Clubbing or edema, No new Rash or bruise  Has a small abscess under her left axilla which a small opening draining pus mild surrounding cellulitis    Data Review   CBC  Recent Labs Lab 04/24/13 2020 04/25/13 0500   WBC 17.8* 11.8*  HGB 13.6 12.4  HCT 38.1 35.2*  PLT 299 270  MCV 83.4 83.4  MCH 29.8 29.4  MCHC 35.7 35.2  RDW 13.1 12.7  LYMPHSABS 4.7*  --   MONOABS 1.2*  --   EOSABS 0.2  --   BASOSABS 0.0  --     Chemistries    Recent Labs Lab 04/24/13 2020 04/25/13 0500  NA 136 140  K 3.8 3.6  CL 97 104  CO2 23 25  GLUCOSE 319* 179*  BUN 7 6  CREATININE 0.60 0.56  CALCIUM 9.7 8.9   ------------------------------------------------------------------------------------------------------------------ No results found for this basename: HGBA1C,  in the last 72 hours ------------------------------------------------------------------------------------------------------------------ No results found for this basename: CHOL, HDL, LDLCALC, TRIG, CHOLHDL, LDLDIRECT,  in the last 72 hours ------------------------------------------------------------------------------------------------------------------ No results found for this basename: TSH, T4TOTAL, FREET3, T3FREE, THYROIDAB,  in the last 72 hours ------------------------------------------------------------------------------------------------------------------ No results found for this basename: VITAMINB12, FOLATE, FERRITIN, TIBC, IRON, RETICCTPCT,  in the last 72 hours  Coagulation profile   Recent Labs Lab 04/25/13 0500  INR 0.98       Prior to Admission medications   Medication Sig Start Date End Date Taking? Authorizing Provider  diltiazem (CARDIZEM CD) 120 MG 24 hr capsule Take 1 capsule (120 mg total) by mouth daily. 04/25/13  Yes Dayna N Dunn, PA-C  estradiol (ESTRACE) 1 MG tablet Take 1 tablet (1 mg total) by mouth daily. 01/23/13  Yes Adam Phenix, MD  glipiZIDE (GLUCOTROL) 5 MG tablet Take 1 tablet (5 mg total) by mouth 2 (two) times daily before a meal. 04/29/13  Yes Leroy Sea, MD  medroxyPROGESTERone (PROVERA) 5 MG tablet Take 1 tablet (5 mg total) by mouth daily. 01/23/13  Yes Adam Phenix, MD   neomycin-bacitracin-polymyxin (NEOSPORIN) ointment Apply 1 application topically daily as needed (for excema).   Yes Historical Provider, MD  OVER THE COUNTER MEDICATION Apply 1 application topically daily as needed (for excema).   Yes Historical Provider, MD  Rivaroxaban (XARELTO) 20 MG TABS tablet Take 1 tablet (20 mg total) by mouth daily with supper. 04/25/13  Yes Dayna N Dunn, PA-C  sulfamethoxazole-trimethoprim (BACTRIM DS) 800-160 MG per tablet Take 1 tablet by mouth 2 (two) times daily. 04/29/13   Leroy Sea, MD     Assessment & Plan    DM type II - A1c was around 8, glycemic control is poor per patient 250, Glucotrol has been doubled, she's been having diarrhea with Glucophage to be stopped. She then requested to do Accu-Cheks q. a.c. at bedtime maintain a log book and bring it next visit.  Lab Results  Component Value Date   HGBA1C 8.7% 04/22/2013     Left axillary abscess- placed on Bactrim for 6 days, have requested to go to the ER to get it incised and drained, as is after hours for outpatient surgical clinic is closed.     Paroxysmal Atrial fibrillation- goal is rate controlled on calcium channel blocker, has not started xaralto due to financial issues, outpatient referred to social work has been made.     Routine health maintenance.  Screening labs. CBC, CMP, TSH, A1c, lipid panel noted  Colonoscopy - referral made   Mammogram,  referral made - had a negative Pap smear a few months ago by her OB per physician  Immunizations flu shot given       Leroy Sea M.D on 04/29/2013 at 4:55 PM

## 2013-04-29 NOTE — Telephone Encounter (Signed)
error 

## 2013-04-29 NOTE — Patient Instructions (Addendum)
Accuchecks 4 times/day, Once in AM empty stomach and then before each meal. Log in all results and show them to your Prim.MD in 3 days. If any glucose reading is under 80 or above 300 call your Prim MD immidiately. Follow Low glucose instructions for glucose under 80 as instructed.  Call our social worker tomorrow and discuss her options for getting xaralto

## 2013-04-29 NOTE — ED Notes (Signed)
NO ANSWER WHEN CALLED X 3 

## 2013-04-29 NOTE — ED Notes (Signed)
Pt here for abscess to left axillary area; pt sts draining some purulent discharge; pt sts starting 4 days ago

## 2013-05-12 ENCOUNTER — Ambulatory Visit (HOSPITAL_COMMUNITY): Payer: No Typology Code available for payment source | Attending: Cardiology | Admitting: Radiology

## 2013-05-12 DIAGNOSIS — R0989 Other specified symptoms and signs involving the circulatory and respiratory systems: Secondary | ICD-10-CM

## 2013-05-12 DIAGNOSIS — R079 Chest pain, unspecified: Secondary | ICD-10-CM

## 2013-05-12 MED ORDER — TECHNETIUM TC 99M SESTAMIBI GENERIC - CARDIOLITE
33.0000 | Freq: Once | INTRAVENOUS | Status: AC | PRN
  Administered 2013-05-12: 33 via INTRAVENOUS

## 2013-05-13 ENCOUNTER — Ambulatory Visit (HOSPITAL_COMMUNITY): Payer: No Typology Code available for payment source | Attending: Cardiology | Admitting: Radiology

## 2013-05-13 VITALS — BP 118/76 | HR 93 | Ht 63.0 in | Wt 242.0 lb

## 2013-05-13 DIAGNOSIS — R002 Palpitations: Secondary | ICD-10-CM | POA: Insufficient documentation

## 2013-05-13 DIAGNOSIS — R110 Nausea: Secondary | ICD-10-CM

## 2013-05-13 DIAGNOSIS — R0609 Other forms of dyspnea: Secondary | ICD-10-CM | POA: Insufficient documentation

## 2013-05-13 DIAGNOSIS — R0989 Other specified symptoms and signs involving the circulatory and respiratory systems: Secondary | ICD-10-CM | POA: Insufficient documentation

## 2013-05-13 DIAGNOSIS — Z6841 Body Mass Index (BMI) 40.0 and over, adult: Secondary | ICD-10-CM | POA: Insufficient documentation

## 2013-05-13 DIAGNOSIS — R55 Syncope and collapse: Secondary | ICD-10-CM | POA: Insufficient documentation

## 2013-05-13 DIAGNOSIS — R0602 Shortness of breath: Secondary | ICD-10-CM

## 2013-05-13 DIAGNOSIS — R079 Chest pain, unspecified: Secondary | ICD-10-CM

## 2013-05-13 DIAGNOSIS — R42 Dizziness and giddiness: Secondary | ICD-10-CM | POA: Insufficient documentation

## 2013-05-13 DIAGNOSIS — R51 Headache: Secondary | ICD-10-CM

## 2013-05-13 DIAGNOSIS — R5381 Other malaise: Secondary | ICD-10-CM | POA: Insufficient documentation

## 2013-05-13 MED ORDER — TECHNETIUM TC 99M SESTAMIBI GENERIC - CARDIOLITE
33.0000 | Freq: Once | INTRAVENOUS | Status: AC | PRN
  Administered 2013-05-13: 33 via INTRAVENOUS

## 2013-05-13 MED ORDER — AMINOPHYLLINE 25 MG/ML IV SOLN
75.0000 mg | Freq: Once | INTRAVENOUS | Status: AC
  Administered 2013-05-13: 75 mg via INTRAVENOUS

## 2013-05-13 MED ORDER — REGADENOSON 0.4 MG/5ML IV SOLN
0.4000 mg | Freq: Once | INTRAVENOUS | Status: AC
  Administered 2013-05-13: 0.4 mg via INTRAVENOUS

## 2013-05-13 NOTE — Progress Notes (Signed)
[facility] SITE 3 NUCLEAR MED [address] [place], [place] [address] [phone]    Cardiology Nuclear Med Study  Anna Golden is a 52 y.o. female     MRN : [id number]     DOB: March 31, 1961  Procedure Date: 05/13/2013  Nuclear Med Background Indication for Stress Test:  Evaluation for Ischemia, and Patient seen in hospital on 04-25-13 for AFIB with RVR, and Chest Pain, Enzymes negative History:  No prior known history of CAD, PAF, 03-2013 Echo: EF=60-65% Cardiac Risk Factors: Family History - CAD, Hypertension, NIDDM and Smoker  Symptoms: Chest Pain/Tightness with/without exertion (last occurrence today),Diaphoresis, Dizziness, DOE, Fatigue, Fatigue with Exertion, Light-Headedness, Nausea, Near Syncope, Palpitations, Rapid HR and SOB   Nuclear Pre-Procedure Caffeine/Decaff Intake:  None > 12 hrs NPO After: 6:00am   Lungs:  Diminished breath sounds, no wheezing, O2 Sat: 96% on room air. IV 0.9% NS with Angio Cath:  22g  IV Site: R Antecubital x 1, tolerated well IV Started by:  Irean Hong, RN  Chest Size (in):  46 Cup Size: D  Height: 5\' 3"  (1.6 m)  Weight:  242 lb (109.77 kg)  BMI:  Body mass index is 42.88 kg/(m^2). Tech Comments:  Diltiazem 24 hrs ago. The patient complained of persistent nausea, and headache after Lexiscan. Aminophylline 75 mg IVP given 9 minutes into recovery with quick improvement of  Symptoms. Irean Hong, RN    Nuclear Med Study 1 or 2 day study: 2 day  Stress Test Type:  Treadmill/Lexiscan  Reading MD: Willa Rough, MD  Order Authorizing Provider:  Charlton Haws, MD  Resting Radionuclide: Technetium 44m Sestamibi  Resting Radionuclide Dose: 33.0 mCi  05/12/13  Stress Radionuclide:  Technetium 15m Sestamibi  Stress Radionuclide Dose: 33.0 mCi   05/13/13          Stress Protocol Rest HR: 93 Stress HR: 146  Rest BP: 118/76 Stress BP: 137/78  Exercise Time (min): 2:00 METS: n/a   Predicted Max HR: 170 bpm % Max HR: 85.88 bpm Rate  Pressure Product: 21308   Dose of Adenosine (mg):  n/a Dose of Lexiscan: 0.4 mg  Dose of Atropine (mg): n/a Dose of Dobutamine: n/a mcg/kg/min (at max HR)  Stress Test Technologist: Irean Hong, RN  Nuclear Technologist:  Domenic Polite, CNMT     Rest Procedure:  Myocardial perfusion imaging was performed at rest 45 minutes following the intravenous administration of Technetium 42m Sestamibi. Rest ECG: NSR - Normal EKG  Stress Procedure:  The patient received IV Lexiscan 0.4 mg over 15-seconds with concurrent low level exercise and then Technetium 60m Sestamibi was injected at 30-seconds while the patient continued walking one more minute.  Quantitative spect images were obtained after a 45-minute delay. Stress ECG: No significant change from baseline ECG  QPS Raw Data Images:  Normal; no motion artifact; normal heart/lung ratio. Stress Images:  Normal homogeneous uptake in all areas of the myocardium. Rest Images:  Normal homogeneous uptake in all areas of the myocardium. Subtraction (SDS):  No evidence of ischemia. Transient Ischemic Dilatation (Normal <1.22):  n/a Lung/Heart Ratio (Normal <0.45):  0.37  Quantitative Gated Spect Images QGS EDV:  78 ml QGS ESV:  30 ml  Impression Exercise Capacity:  Lexiscan with low level exercise. BP Response:  Normal blood pressure response. Clinical Symptoms:  No significant symptoms noted. ECG Impression:  No significant ST segment change suggestive of ischemia. Comparison with Prior Nuclear Study: No previous nuclear study performed  Overall Impression:  Normal  stress nuclear study.  LV Ejection Fraction: 62%.  LV Wall Motion:  NL LV Function; NL Wall Motion  Anna Golden, Anna Golden 05/14/2013

## 2013-05-20 ENCOUNTER — Other Ambulatory Visit: Payer: No Typology Code available for payment source

## 2013-05-21 ENCOUNTER — Other Ambulatory Visit: Payer: No Typology Code available for payment source

## 2013-05-25 ENCOUNTER — Other Ambulatory Visit: Payer: No Typology Code available for payment source

## 2013-05-28 ENCOUNTER — Institutional Professional Consult (permissible substitution): Payer: Self-pay | Admitting: Cardiovascular Disease

## 2013-06-11 ENCOUNTER — Encounter: Payer: Self-pay | Admitting: Internal Medicine

## 2013-06-11 ENCOUNTER — Ambulatory Visit: Payer: No Typology Code available for payment source | Admitting: Obstetrics & Gynecology

## 2013-06-15 ENCOUNTER — Institutional Professional Consult (permissible substitution): Payer: Self-pay | Admitting: Cardiovascular Disease

## 2013-06-25 ENCOUNTER — Institutional Professional Consult (permissible substitution): Payer: Self-pay | Admitting: Cardiovascular Disease

## 2013-08-03 ENCOUNTER — Institutional Professional Consult (permissible substitution): Payer: Self-pay | Admitting: Cardiovascular Disease

## 2013-08-11 ENCOUNTER — Encounter: Payer: Self-pay | Admitting: Cardiovascular Disease

## 2013-09-22 ENCOUNTER — Ambulatory Visit: Payer: Self-pay

## 2013-10-15 ENCOUNTER — Ambulatory Visit: Payer: Self-pay

## 2013-11-30 ENCOUNTER — Institutional Professional Consult (permissible substitution): Payer: Self-pay | Admitting: Cardiovascular Disease

## 2013-12-09 ENCOUNTER — Encounter: Payer: Self-pay | Admitting: Cardiovascular Disease

## 2014-01-12 ENCOUNTER — Ambulatory Visit: Payer: Self-pay | Admitting: Internal Medicine

## 2014-02-01 ENCOUNTER — Institutional Professional Consult (permissible substitution): Payer: Self-pay | Admitting: Cardiovascular Disease

## 2014-02-01 ENCOUNTER — Ambulatory Visit: Payer: Self-pay | Admitting: Internal Medicine

## 2014-02-02 ENCOUNTER — Encounter: Payer: Self-pay | Admitting: Cardiovascular Disease

## 2014-02-17 ENCOUNTER — Encounter: Payer: Self-pay | Admitting: Obstetrics & Gynecology

## 2014-03-06 ENCOUNTER — Emergency Department (HOSPITAL_COMMUNITY): Payer: Self-pay

## 2014-03-06 ENCOUNTER — Encounter (HOSPITAL_COMMUNITY): Payer: Self-pay | Admitting: Emergency Medicine

## 2014-03-06 DIAGNOSIS — T380X5A Adverse effect of glucocorticoids and synthetic analogues, initial encounter: Secondary | ICD-10-CM | POA: Diagnosis not present

## 2014-03-06 DIAGNOSIS — J45901 Unspecified asthma with (acute) exacerbation: Principal | ICD-10-CM | POA: Diagnosis present

## 2014-03-06 DIAGNOSIS — I10 Essential (primary) hypertension: Secondary | ICD-10-CM | POA: Diagnosis present

## 2014-03-06 DIAGNOSIS — D72829 Elevated white blood cell count, unspecified: Secondary | ICD-10-CM | POA: Diagnosis not present

## 2014-03-06 DIAGNOSIS — I4891 Unspecified atrial fibrillation: Secondary | ICD-10-CM | POA: Diagnosis present

## 2014-03-06 DIAGNOSIS — E1010 Type 1 diabetes mellitus with ketoacidosis without coma: Secondary | ICD-10-CM | POA: Diagnosis present

## 2014-03-06 LAB — CBC
HCT: 37 % (ref 36.0–46.0)
HEMOGLOBIN: 13.4 g/dL (ref 12.0–15.0)
MCH: 30 pg (ref 26.0–34.0)
MCHC: 36.2 g/dL — ABNORMAL HIGH (ref 30.0–36.0)
MCV: 82.8 fL (ref 78.0–100.0)
Platelets: 238 10*3/uL (ref 150–400)
RBC: 4.47 MIL/uL (ref 3.87–5.11)
RDW: 13 % (ref 11.5–15.5)
WBC: 14.9 10*3/uL — ABNORMAL HIGH (ref 4.0–10.5)

## 2014-03-06 LAB — COMPREHENSIVE METABOLIC PANEL
ALT: 13 U/L (ref 0–35)
ANION GAP: 20 — AB (ref 5–15)
AST: 17 U/L (ref 0–37)
Albumin: 3.5 g/dL (ref 3.5–5.2)
Alkaline Phosphatase: 41 U/L (ref 39–117)
BUN: 8 mg/dL (ref 6–23)
CALCIUM: 9.1 mg/dL (ref 8.4–10.5)
CO2: 19 mEq/L (ref 19–32)
Chloride: 92 mEq/L — ABNORMAL LOW (ref 96–112)
Creatinine, Ser: 0.61 mg/dL (ref 0.50–1.10)
GFR calc Af Amer: >90 mL/min (ref >90)
GFR calc non Af Amer: >90 mL/min (ref >90)
Glucose, Bld: 613 mg/dL (ref 70–99)
Potassium: 3.7 mEq/L (ref 3.7–5.3)
SODIUM: 131 meq/L — AB (ref 137–147)
Total Bilirubin: 0.3 mg/dL (ref 0.3–1.2)
Total Protein: 7.5 g/dL (ref 6.0–8.3)

## 2014-03-06 LAB — I-STAT TROPONIN, ED: TROPONIN I, POC: 0 ng/mL (ref 0.00–0.08)

## 2014-03-06 LAB — PRO B NATRIURETIC PEPTIDE: PRO B NATRI PEPTIDE: 21.8 pg/mL (ref 0–125)

## 2014-03-06 NOTE — ED Notes (Signed)
Pt is here with sob for a couple of days and reports cough.  Pt reports fever.  History of afib but not taking blood thinner related to cost.  Pt states swelling to legs but has decreased

## 2014-03-07 ENCOUNTER — Inpatient Hospital Stay (HOSPITAL_COMMUNITY)
Admission: EM | Admit: 2014-03-07 | Discharge: 2014-03-09 | DRG: 202 | Disposition: A | Discharge status: Home / Self Care | Payer: Self-pay | Attending: Internal Medicine | Admitting: Internal Medicine

## 2014-03-07 DIAGNOSIS — E669 Obesity, unspecified: Secondary | ICD-10-CM

## 2014-03-07 DIAGNOSIS — Z720 Tobacco use: Secondary | ICD-10-CM

## 2014-03-07 DIAGNOSIS — Z91199 Patient's noncompliance with other medical treatment and regimen due to unspecified reason: Secondary | ICD-10-CM

## 2014-03-07 DIAGNOSIS — I10 Essential (primary) hypertension: Secondary | ICD-10-CM | POA: Diagnosis present

## 2014-03-07 DIAGNOSIS — J45901 Unspecified asthma with (acute) exacerbation: Secondary | ICD-10-CM

## 2014-03-07 DIAGNOSIS — J4522 Mild intermittent asthma with status asthmaticus: Secondary | ICD-10-CM

## 2014-03-07 DIAGNOSIS — E119 Type 2 diabetes mellitus without complications: Secondary | ICD-10-CM | POA: Diagnosis present

## 2014-03-07 DIAGNOSIS — E1142 Type 2 diabetes mellitus with diabetic polyneuropathy: Secondary | ICD-10-CM | POA: Diagnosis present

## 2014-03-07 DIAGNOSIS — I4891 Unspecified atrial fibrillation: Secondary | ICD-10-CM

## 2014-03-07 DIAGNOSIS — E1110 Type 2 diabetes mellitus with ketoacidosis without coma: Secondary | ICD-10-CM | POA: Diagnosis present

## 2014-03-07 DIAGNOSIS — E1310 Other specified diabetes mellitus with ketoacidosis without coma: Secondary | ICD-10-CM

## 2014-03-07 DIAGNOSIS — Z9119 Patient's noncompliance with other medical treatment and regimen: Secondary | ICD-10-CM

## 2014-03-07 DIAGNOSIS — E1165 Type 2 diabetes mellitus with hyperglycemia: Secondary | ICD-10-CM | POA: Diagnosis present

## 2014-03-07 DIAGNOSIS — J4521 Mild intermittent asthma with (acute) exacerbation: Secondary | ICD-10-CM

## 2014-03-07 DIAGNOSIS — I480 Paroxysmal atrial fibrillation: Secondary | ICD-10-CM | POA: Diagnosis present

## 2014-03-07 HISTORY — DX: Type 2 diabetes mellitus with ketoacidosis without coma: E11.10

## 2014-03-07 HISTORY — DX: Unspecified asthma with (acute) exacerbation: J45.901

## 2014-03-07 LAB — BASIC METABOLIC PANEL
Anion gap: 22 — ABNORMAL HIGH (ref 5–15)
BUN: 6 mg/dL (ref 6–23)
CHLORIDE: 101 meq/L (ref 96–112)
CO2: 16 mEq/L — ABNORMAL LOW (ref 19–32)
CREATININE: 0.53 mg/dL (ref 0.50–1.10)
Calcium: 9.4 mg/dL (ref 8.4–10.5)
GFR calc non Af Amer: >90 mL/min (ref >90)
Glucose, Bld: 295 mg/dL — ABNORMAL HIGH (ref 70–99)
Potassium: 3.8 mEq/L (ref 3.7–5.3)
Sodium: 139 mEq/L (ref 137–147)

## 2014-03-07 LAB — CBC WITH DIFFERENTIAL/PLATELET
Basophils Absolute: 0 10*3/uL (ref 0.0–0.1)
Basophils Relative: 0 % (ref 0–1)
EOS ABS: 0.2 10*3/uL (ref 0.0–0.7)
EOS PCT: 2 % (ref 0–5)
HCT: 35 % — ABNORMAL LOW (ref 36.0–46.0)
HEMOGLOBIN: 12.5 g/dL (ref 12.0–15.0)
LYMPHS ABS: 3.8 10*3/uL (ref 0.7–4.0)
Lymphocytes Relative: 25 % (ref 12–46)
MCH: 29.4 pg (ref 26.0–34.0)
MCHC: 35.7 g/dL (ref 30.0–36.0)
MCV: 82.4 fL (ref 78.0–100.0)
MONOS PCT: 4 % (ref 3–12)
Monocytes Absolute: 0.6 10*3/uL (ref 0.1–1.0)
Neutro Abs: 10.4 10*3/uL — ABNORMAL HIGH (ref 1.7–7.7)
Neutrophils Relative %: 69 % (ref 43–77)
Platelets: 193 10*3/uL (ref 150–400)
RBC: 4.25 MIL/uL (ref 3.87–5.11)
RDW: 13.1 % (ref 11.5–15.5)
WBC: 15.1 10*3/uL — ABNORMAL HIGH (ref 4.0–10.5)

## 2014-03-07 LAB — GLUCOSE, CAPILLARY
GLUCOSE-CAPILLARY: 146 mg/dL — AB (ref 70–99)
GLUCOSE-CAPILLARY: 203 mg/dL — AB (ref 70–99)
GLUCOSE-CAPILLARY: 314 mg/dL — AB (ref 70–99)
GLUCOSE-CAPILLARY: 342 mg/dL — AB (ref 70–99)
GLUCOSE-CAPILLARY: 365 mg/dL — AB (ref 70–99)
GLUCOSE-CAPILLARY: 421 mg/dL — AB (ref 70–99)
GLUCOSE-CAPILLARY: 433 mg/dL — AB (ref 70–99)
Glucose-Capillary: 347 mg/dL — ABNORMAL HIGH (ref 70–99)
Glucose-Capillary: 280 mg/dL — ABNORMAL HIGH (ref 70–99)
Glucose-Capillary: 252 mg/dL — ABNORMAL HIGH (ref 70–99)
Glucose-Capillary: 246 mg/dL — ABNORMAL HIGH (ref 70–99)
Glucose-Capillary: 281 mg/dL — ABNORMAL HIGH (ref 70–99)
Glucose-Capillary: 264 mg/dL — ABNORMAL HIGH (ref 70–99)
Glucose-Capillary: 271 mg/dL — ABNORMAL HIGH (ref 70–99)
Glucose-Capillary: 183 mg/dL — ABNORMAL HIGH (ref 70–99)
Glucose-Capillary: 166 mg/dL — ABNORMAL HIGH (ref 70–99)

## 2014-03-07 LAB — CBC
HEMATOCRIT: 36.9 % (ref 36.0–46.0)
Hemoglobin: 13.1 g/dL (ref 12.0–15.0)
MCH: 29.6 pg (ref 26.0–34.0)
MCHC: 35.5 g/dL (ref 30.0–36.0)
MCV: 83.5 fL (ref 78.0–100.0)
Platelets: 230 10*3/uL (ref 150–400)
RBC: 4.42 MIL/uL (ref 3.87–5.11)
RDW: 13.1 % (ref 11.5–15.5)
WBC: 17.2 10*3/uL — AB (ref 4.0–10.5)

## 2014-03-07 LAB — COMPREHENSIVE METABOLIC PANEL
ALT: 11 U/L (ref 0–35)
ANION GAP: 19 — AB (ref 5–15)
AST: 14 U/L (ref 0–37)
Albumin: 3.2 g/dL — ABNORMAL LOW (ref 3.5–5.2)
Alkaline Phosphatase: 38 U/L — ABNORMAL LOW (ref 39–117)
BUN: 6 mg/dL (ref 6–23)
CALCIUM: 8.7 mg/dL (ref 8.4–10.5)
CO2: 20 mEq/L (ref 19–32)
Chloride: 100 mEq/L (ref 96–112)
Creatinine, Ser: 0.54 mg/dL (ref 0.50–1.10)
GFR calc Af Amer: >90 mL/min (ref >90)
GLUCOSE: 426 mg/dL — AB (ref 70–99)
Potassium: 3.2 mEq/L — ABNORMAL LOW (ref 3.7–5.3)
Sodium: 139 mEq/L (ref 137–147)
TOTAL PROTEIN: 7 g/dL (ref 6.0–8.3)
Total Bilirubin: 0.3 mg/dL (ref 0.3–1.2)

## 2014-03-07 LAB — PROTIME-INR
INR: 1.13 (ref 0.00–1.49)
Prothrombin Time: 14.5 seconds (ref 11.6–15.2)

## 2014-03-07 LAB — HEMOGLOBIN A1C
HEMOGLOBIN A1C: 11.3 % — AB (ref <5.7)
Mean Plasma Glucose: 278 mg/dL — ABNORMAL HIGH (ref <117)

## 2014-03-07 LAB — CBG MONITORING, ED
Glucose-Capillary: 555 mg/dL (ref 70–99)
Glucose-Capillary: 377 mg/dL — ABNORMAL HIGH (ref 70–99)
Glucose-Capillary: 410 mg/dL — ABNORMAL HIGH (ref 70–99)

## 2014-03-07 LAB — TROPONIN I: Troponin I: <0.3 ng/mL (ref <0.30)

## 2014-03-07 MED ORDER — IPRATROPIUM BROMIDE 0.02 % IN SOLN
1.0000 mg | Freq: Once | RESPIRATORY_TRACT | Status: AC
  Administered 2014-03-07: 1 mg via RESPIRATORY_TRACT
  Filled 2014-03-07: qty 5

## 2014-03-07 MED ORDER — LEVOFLOXACIN IN D5W 750 MG/150ML IV SOLN
750.0000 mg | INTRAVENOUS | Status: DC
  Filled 2014-03-07: qty 150

## 2014-03-07 MED ORDER — SODIUM CHLORIDE 0.9 % IV SOLN
INTRAVENOUS | Status: DC
  Administered 2014-03-07: 05:00:00 via INTRAVENOUS

## 2014-03-07 MED ORDER — ALBUTEROL (5 MG/ML) CONTINUOUS INHALATION SOLN
10.0000 mg/h | INHALATION_SOLUTION | Freq: Once | RESPIRATORY_TRACT | Status: AC
  Administered 2014-03-07: 10 mg/h via RESPIRATORY_TRACT
  Filled 2014-03-07: qty 20

## 2014-03-07 MED ORDER — PREDNISONE 20 MG PO TABS
40.0000 mg | ORAL_TABLET | Freq: Every day | ORAL | Status: DC
  Administered 2014-03-08: 40 mg via ORAL
  Filled 2014-03-07 (×3): qty 2

## 2014-03-07 MED ORDER — METHYLPREDNISOLONE SODIUM SUCC 125 MG IJ SOLR
125.0000 mg | Freq: Once | INTRAMUSCULAR | Status: AC
  Administered 2014-03-07: 125 mg via INTRAVENOUS
  Filled 2014-03-07: qty 2

## 2014-03-07 MED ORDER — LEVOFLOXACIN IN D5W 750 MG/150ML IV SOLN
750.0000 mg | INTRAVENOUS | Status: DC
  Administered 2014-03-07: 750 mg via INTRAVENOUS
  Filled 2014-03-07 (×2): qty 150

## 2014-03-07 MED ORDER — ACETAMINOPHEN 650 MG RE SUPP
650.0000 mg | Freq: Four times a day (QID) | RECTAL | Status: DC | PRN
Start: 2014-03-07 — End: 2014-03-09

## 2014-03-07 MED ORDER — POTASSIUM CHLORIDE 10 MEQ/100ML IV SOLN
10.0000 meq | INTRAVENOUS | Status: AC
  Administered 2014-03-07 (×3): 10 meq via INTRAVENOUS
  Filled 2014-03-07 (×3): qty 100

## 2014-03-07 MED ORDER — SODIUM CHLORIDE 0.9 % IV SOLN
INTRAVENOUS | Status: DC
  Filled 2014-03-07: qty 1

## 2014-03-07 MED ORDER — DEXTROSE-NACL 5-0.45 % IV SOLN
INTRAVENOUS | Status: DC
  Administered 2014-03-07: 100 mL/h via INTRAVENOUS
  Administered 2014-03-08: 03:00:00 via INTRAVENOUS

## 2014-03-07 MED ORDER — ONDANSETRON HCL 4 MG PO TABS: 4.0000 mg | ORAL_TABLET | Freq: Four times a day (QID) | ORAL | Status: DC | PRN

## 2014-03-07 MED ORDER — ACETAMINOPHEN 325 MG PO TABS: 650.0000 mg | ORAL_TABLET | Freq: Four times a day (QID) | ORAL | Status: DC | PRN

## 2014-03-07 MED ORDER — IPRATROPIUM-ALBUTEROL 0.5-2.5 (3) MG/3ML IN SOLN
3.0000 mL | Freq: Two times a day (BID) | RESPIRATORY_TRACT | Status: DC
  Administered 2014-03-08: 3 mL via RESPIRATORY_TRACT
  Filled 2014-03-07: qty 3

## 2014-03-07 MED ORDER — SODIUM CHLORIDE 0.9 % IV BOLUS (SEPSIS)
1000.0000 mL | Freq: Once | INTRAVENOUS | Status: AC
  Administered 2014-03-07: 1000 mL via INTRAVENOUS

## 2014-03-07 MED ORDER — DEXTROSE 50 % IV SOLN: 25.0000 mL | INTRAVENOUS | Status: DC | PRN

## 2014-03-07 MED ORDER — SODIUM CHLORIDE 0.9 % IV SOLN
INTRAVENOUS | Status: DC
  Administered 2014-03-07: 14.9 [IU]/h via INTRAVENOUS
  Administered 2014-03-07: 13.5 [IU]/h via INTRAVENOUS
  Administered 2014-03-07: 05:00:00 via INTRAVENOUS
  Administered 2014-03-08: 4.7 [IU]/h via INTRAVENOUS
  Administered 2014-03-08: 3.4 [IU]/h via INTRAVENOUS
  Administered 2014-03-08: 19.6 [IU]/h via INTRAVENOUS
  Filled 2014-03-07 (×5): qty 1

## 2014-03-07 MED ORDER — HYDROCOD POLST-CHLORPHEN POLST 10-8 MG/5ML PO LQCR
5.0000 mL | Freq: Two times a day (BID) | ORAL | Status: DC | PRN
  Administered 2014-03-07 – 2014-03-08 (×2): 5 mL via ORAL
  Filled 2014-03-07 (×2): qty 5

## 2014-03-07 MED ORDER — ENOXAPARIN SODIUM 40 MG/0.4ML ~~LOC~~ SOLN
40.0000 mg | SUBCUTANEOUS | Status: DC
  Filled 2014-03-07 (×2): qty 0.4

## 2014-03-07 MED ORDER — IPRATROPIUM-ALBUTEROL 0.5-2.5 (3) MG/3ML IN SOLN
3.0000 mL | RESPIRATORY_TRACT | Status: DC
  Administered 2014-03-07 (×2): 3 mL via RESPIRATORY_TRACT
  Filled 2014-03-07 (×2): qty 3

## 2014-03-07 MED ORDER — NICOTINE 21 MG/24HR TD PT24
21.0000 mg | MEDICATED_PATCH | Freq: Every day | TRANSDERMAL | Status: DC
  Administered 2014-03-07 – 2014-03-09 (×3): 21 mg via TRANSDERMAL
  Filled 2014-03-07 (×3): qty 1

## 2014-03-07 MED ORDER — LEVOFLOXACIN IN D5W 750 MG/150ML IV SOLN: 750.0000 mg | Freq: Once | INTRAVENOUS | Status: DC

## 2014-03-07 MED ORDER — ALBUTEROL SULFATE (2.5 MG/3ML) 0.083% IN NEBU
2.5000 mg | INHALATION_SOLUTION | Freq: Four times a day (QID) | RESPIRATORY_TRACT | Status: DC | PRN
  Administered 2014-03-07 – 2014-03-08 (×2): 2.5 mg via RESPIRATORY_TRACT
  Filled 2014-03-07 (×2): qty 3

## 2014-03-07 MED ORDER — ONDANSETRON HCL 4 MG/2ML IJ SOLN: 4.0000 mg | Freq: Four times a day (QID) | INTRAMUSCULAR | Status: DC | PRN

## 2014-03-07 NOTE — ED Notes (Signed)
Attempt to call report to floor.  RN to call back.

## 2014-03-07 NOTE — H&P (Signed)
[facility] History and Physical  Patient: Anna Golden  KDT:[id number]  DOB: 08-Jan-1961  DOS: the patient was seen and examined on 03/07/2014 PCP: Angelica Chessman, MD  Chief Complaint: Shortness of breath  HPI: Anna Golden is a 53 y.o. female with Past medical history of diabetes mellitus, hypertension, asthma, sickle cell trait, atrial fibrillation. Patient is presenting with complaints of shortness of breath with cough fever and chills. She mentions this has been ongoing since last one week progressively worsening. Along with that she also has progressively worsening increased thirst and polyuria, along with significant weight loss he did She mentions that she was on inhalers in the past she was on diabetes medication in the past and she was also on Xarelto in the past but she's not taking them as she cannot afford them and she is not prescribed inhalers any more. She continues to smoke 3-4 cigarettes a day and recent past she has been smoking 1 pack a day. She denies any recent travel or recent sick contact or recent immobilization.  The patient is coming from home. And at her baseline independent for most of her ADL.  Review of Systems: as mentioned in the history of present illness.  A Comprehensive review of the other systems is negative.  Past Medical History  Diagnosis Date  . Diabetes mellitus   . Hypertension   . Arthritis   . Hypokalemia   . Asthma   . Sickle cell trait   . Atrial fibrillation     a. Dx 03/2013.   Past Surgical History  Procedure Laterality Date  . Hernia repair    . Appendectomy     Social History:  reports that she has been smoking Cigarettes.  She has been smoking about 0.25 packs per day. She has never used smokeless tobacco. She reports that she does not drink alcohol or use illicit drugs.  Allergies  Allergen Reactions  . Metoprolol Other (See Comments)    "Patient slept for whole day after starting medication this week.   Patient does not want to take this med.  Not sure if med causes hypotension, but could not get out of bed for any reason"  . Other Other (See Comments)    ANESTHESIA-CAUSES CARDIAC ARREST AND LUNGS COLLAPSED IN 1988, 2008.    . Latex Itching    Swell up  . Pork-Derived Products Palpitations and Other (See Comments)    Also raises Blood pressure-Patient avoids pork and pork products    Family History  Problem Relation Age of Onset  . Heart disease Mother   . Diabetes Mother   . Sickle cell anemia Mother   . Cancer Father     colon    Prior to Admission medications   Medication Sig Start Date End Date Taking? Authorizing Provider  neomycin-bacitracin-polymyxin (NEOSPORIN) ointment Apply 1 application topically daily as needed (for excema).   Yes Historical Provider, MD    Physical Exam: Filed Vitals:   03/07/14 0300 03/07/14 0322 03/07/14 0330 03/07/14 0400  BP: 143/73 143/73 134/74 133/63  Pulse: 110 113 121 122  Temp:  99 F (37.2 C)    TempSrc:  Oral    Resp: 23 19 19 18   SpO2: 100% 100% 100% 96%    General: Alert, Awake and Oriented to Time, Place and Person. Appear in mild distress Eyes: PERRL ENT: Oral Mucosa clear moist. Neck:  no  JVD Cardiovascular: S1 and S2 Present, no  Murmur, Peripheral Pulses Present Respiratory: Bilateral Air entry  equal and Decreased,  extensive bilateral rhonchi, no  Crackles,Extensive bilateral wheezes  Abdomen: Bowel Sound Present, Soft and Non tender Skin:  no  Rash Extremities:  trace  Pedal edema,  no  calf tenderness Neurologic: Grossly no focal neuro deficit.  Labs on Admission:  CBC:  Recent Labs Lab 03/06/14 2220 03/07/14 0350  WBC 14.9* 15.1*  NEUTROABS  --  10.4*  HGB 13.4 12.5  HCT 37.0 35.0*  MCV 82.8 82.4  PLT 238 193    CMP     Component Value Date/Time   NA 131* 03/06/2014 2220   K 3.7 03/06/2014 2220   CL 92* 03/06/2014 2220   CO2 19 03/06/2014 2220   GLUCOSE 613* 03/06/2014 2220   BUN 8 03/06/2014 2220    CREATININE 0.61 03/06/2014 2220   CREATININE 0.72 04/22/2013 1036   CALCIUM 9.1 03/06/2014 2220   PROT 7.5 03/06/2014 2220   ALBUMIN 3.5 03/06/2014 2220   AST 17 03/06/2014 2220   ALT 13 03/06/2014 2220   ALKPHOS 41 03/06/2014 2220   BILITOT 0.3 03/06/2014 2220   GFRNONAA >90 03/06/2014 2220   GFRAA >90 03/06/2014 2220    No results found for this basename: LIPASE, AMYLASE,  in the last 168 hours No results found for this basename: AMMONIA,  in the last 168 hours  No results found for this basename: CKTOTAL, CKMB, CKMBINDEX, TROPONINI,  in the last 168 hours BNP (last 3 results)  Recent Labs  03/06/14 2220  PROBNP 21.8    Radiological Exams on Admission: Dg Chest 2 View  03/07/2014   CLINICAL DATA:  Shortness of breath for 1 week with cough and congestion.  EXAM: CHEST  2 VIEW  COMPARISON:  04/24/2013  FINDINGS: The heart size and mediastinal contours are within normal limits. Both lungs are clear. The visualized skeletal structures are unremarkable.  IMPRESSION: No active cardiopulmonary disease.   Electronically Signed   By: Lucienne Capers M.D.   On: 03/07/2014 00:22    EKG: Independently reviewed. sinus tachycardia.  Assessment/Plan Principal Problem:   Acute asthma exacerbation Active Problems:   PAF (paroxysmal atrial fibrillation)   DM (diabetes mellitus)   Hypertension   DKA (diabetic ketoacidoses)   1. Acute asthma exacerbation  patient is presenting with complaints of shortness of breath. She is found to have significant bilateral wheezes and rhonchi with normal chest x-ray. She was not hypoxic but appears to be in significant respiratory distress. With this the patient will be admitted to the hospital. I will give her IV levofloxacin, check sputum cultures, urine antigens and respiratory pathogen PCR. Continue with dual nebs. Holding steroids at present as patient has already received a large dose on 25 mg of Medrol and is in DKA with hyperglycemia. Continue to monitor  for improvement. Monitor her PEFR  2. DKA Patient has an anion gap of 21 and anion gap is closing gradually. Patient will be continued on IV insulin with glucose stabilizer. IV hydration. Monitor BMP every 4 hours.  Check hemoglobin A1c.  3.Atrial fibrillation. Patient is in sinus tachycardia without any A. fib. Patient was on anticoagulation which she is on right now. At present I will place her on aspirin 81 mg.  4.Hypertension Blood pressure is stable Continue to monitor. Patient was on Cardizem in the past on 120 mg daily  DVT Prophylaxis: subcutaneous Heparin Nutrition:  n.p.o.   Code Status:  full   Disposition: Admitted to inpatient in telemetry unit.  Author: Berle Mull, MD [facility] Pager:  [phone] 03/07/2014, 4:18 AM    If 7PM-7AM, please contact night-coverage [email or web address] Password [email or web address]  **Disclaimer: This note may have been dictated with voice recognition software. Similar sounding words can inadvertently be transcribed and this note may contain transcription errors which may not have been corrected upon publication of note.**

## 2014-03-07 NOTE — ED Notes (Signed)
MD at bedside. 

## 2014-03-07 NOTE — Progress Notes (Signed)
Attempt to call report to ED nurse [facility] " she will call back"

## 2014-03-07 NOTE — ED Notes (Signed)
Phlebotomy at bedside.

## 2014-03-07 NOTE — ED Provider Notes (Signed)
CSN: [id number]     Arrival date & time 03/06/14  2132 History   First MD Initiated Contact with Patient 03/07/14 0125     Chief Complaint  Patient presents with  . Shortness of Breath     (Consider location/radiation/quality/duration/timing/severity/associated sxs/prior Treatment) HPI Patient is a 53 yo non insulin dependent diabetic woman with a history of asthma, HTN and atrial fibrillation.   She comes in with complaints of wheezing and a minimally productive cough x 2 days. She says she does not have an Albuterol HFA at home. She smokes 1/2 ppd. She denies fever and chest pain. The patient is noted, incidentally, to have a glucose level in the 600s with an anion gap which is elevated at 20. Her bicarb level is wnl.   She admits to polydypsia and polyuria. No abdominal pain, nausea or vomiting.   H&H's she has been out of all of her medications for over one month because she has not had money to get her medicines filled.  Past Medical History  Diagnosis Date  . Diabetes mellitus   . Hypertension   . Arthritis   . Hypokalemia   . Asthma   . Sickle cell trait   . Atrial fibrillation     a. Dx 03/2013.   Past Surgical History  Procedure Laterality Date  . Hernia repair    . Appendectomy     Family History  Problem Relation Age of Onset  . Heart disease Mother   . Diabetes Mother   . Sickle cell anemia Mother   . Cancer Father     colon   History  Substance Use Topics  . Smoking status: Current Every Day Smoker -- 0.25 packs/day    Types: Cigarettes  . Smokeless tobacco: Never Used  . Alcohol Use: No   OB History   Grav Para Term Preterm Abortions TAB SAB Ect Mult Living   6 4 1 3 2  2   3      Review of Systems  Ten point review of symptoms performed and is negative with the exception of symptoms noted above.   Allergies  Metoprolol; Other; Latex; and Pork-derived products  Home Medications   Prior to Admission medications   Medication Sig Start Date  End Date Taking? Authorizing Provider  neomycin-bacitracin-polymyxin (NEOSPORIN) ointment Apply 1 application topically daily as needed (for excema).   Yes Historical Provider, MD   BP 125/85  Pulse 103  Temp(Src) 98.5 F (36.9 C) (Oral)  Resp 22  SpO2 97% Physical Exam Gen: well developed and well nourished appearing, ill appearing Head: NCAT Eyes: PERL, EOMI Nose: no epistaixis or rhinorrhea Mouth/throat: mucosa is dehydrated appearing and pink Neck: supple, no stridor Lungs: Respiratory rate is 24 per minute, breath sounds markedly diminished in all lung fields with some scattered high pitched wheezing. CV: Rapid and regular, pulse 112, no murmur, extremities appear well perfused.  Abd: soft, obese, notender, nondistended Back: no ttp, no cva ttp Skin: warm and dry Ext: normal to inspection, no dependent edema Neuro: CN ii-xii grossly intact, no focal deficits Psyche; normal affect,  calm and cooperative.  ED Course  Procedures (including critical care time) Labs Review  Results for orders placed during the hospital encounter of 03/07/14 (from the past 24 hour(s))  PRO B NATRIURETIC PEPTIDE     Status: None   Collection Time    03/06/14 10:20 PM      Result Value Ref Range   Pro B Natriuretic peptide (BNP) 21.8  0 - 125 pg/mL  CBC     Status: Abnormal   Collection Time    03/06/14 10:20 PM      Result Value Ref Range   WBC 14.9 (*) 4.0 - 10.5 K/uL   RBC 4.47  3.87 - 5.11 MIL/uL   Hemoglobin 13.4  12.0 - 15.0 g/dL   HCT 37.0  36.0 - 46.0 %   MCV 82.8  78.0 - 100.0 fL   MCH 30.0  26.0 - 34.0 pg   MCHC 36.2 (*) 30.0 - 36.0 g/dL   RDW 13.0  11.5 - 15.5 %   Platelets 238  150 - 400 K/uL  COMPREHENSIVE METABOLIC PANEL     Status: Abnormal   Collection Time    03/06/14 10:20 PM      Result Value Ref Range   Sodium 131 (*) 137 - 147 mEq/L   Potassium 3.7  3.7 - 5.3 mEq/L   Chloride 92 (*) 96 - 112 mEq/L   CO2 19  19 - 32 mEq/L   Glucose, Bld 613 (*) 70 - 99 mg/dL    BUN 8  6 - 23 mg/dL   Creatinine, Ser 0.61  0.50 - 1.10 mg/dL   Calcium 9.1  8.4 - 10.5 mg/dL   Total Protein 7.5  6.0 - 8.3 g/dL   Albumin 3.5  3.5 - 5.2 g/dL   AST 17  0 - 37 U/L   ALT 13  0 - 35 U/L   Alkaline Phosphatase 41  39 - 117 U/L   Total Bilirubin 0.3  0.3 - 1.2 mg/dL   GFR calc non Af Amer >90  >90 mL/min   GFR calc Af Amer >90  >90 mL/min   Anion gap 20 (*) 5 - 15  I-STAT TROPOININ, ED     Status: None   Collection Time    03/06/14 10:28 PM      Result Value Ref Range   Troponin i, poc 0.00  0.00 - 0.08 ng/mL   Comment 3           CBG MONITORING, ED     Status: Abnormal   Collection Time    03/07/14  1:32 AM      Result Value Ref Range   Glucose-Capillary 555 (*) 70 - 99 mg/dL    Imaging Review Dg Chest 2 View  03/07/2014   CLINICAL DATA:  Shortness of breath for 1 week with cough and congestion.  EXAM: CHEST  2 VIEW  COMPARISON:  04/24/2013  FINDINGS: The heart size and mediastinal contours are within normal limits. Both lungs are clear. The visualized skeletal structures are unremarkable.  IMPRESSION: No active cardiopulmonary disease.   Electronically Signed   By: Lucienne Capers M.D.   On: 03/07/2014 00:22   EKG: Sinus tachycardia, no acute ischemic changes, normal intervals, normal axis, normal qrs complex CRITICAL CARE Performed by: Elyn Peers   Total critical care time: 38m  Critical care time was exclusive of separately billable procedures and treating other patients.  Critical care was necessary to treat or prevent imminent or life-threatening deterioration.  Critical care was time spent personally by me on the following activities: development of treatment plan with patient and/or surrogate as well as nursing, discussions with consultants, evaluation of patient's response to treatment, examination of patient, obtaining history from patient or surrogate, ordering and performing treatments and interventions, ordering and review of laboratory studies,  ordering and review of radiographic studies, pulse oximetry and re-evaluation of patient's condition.  MDM   Patient with hyperglycemia and early DKA - we are treating with IVF resus and IV insulin gtt.   Patient with status asthmaticus - we are treating with high dose continuous nebs and steroids.   Patient will require admission.   0244: Patient with improving breath sounds on continous neb. Case discussed with Dr. Posey Pronto who has accepted the patient to the Tele Unit     Elyn Peers, MD 03/07/14 [phone]

## 2014-03-07 NOTE — Progress Notes (Signed)
[facility] PROGRESS NOTE  REVONDA MENTER QBV:[id number] DOB: Jan 06, 1961 DOA: 03/07/2014 PCP: Angelica Chessman, MD  Assessment/Plan: 1. Acute asthma exacerbation: - improved. Not wheezing anymore. On prednisone taper.   2. Tobacco abuse: cousneling given.  Nicotine patch.   3. Mild DKA: - IMPROVING. Gap is still >20. On IV insulin. BMP tonight. Keep potassium >4.  hgba1c is pending.   H/o a fib: - sinus currently.  On aspirin  Hypertension controlled.   DVT prophylaxis.   Code Status: full code Family Communication: none at bedside.  Disposition Plan: in a day or two.    Consultants:  none  Procedures:  none  Antibiotics:  Levaquin.   HPI/Subjective: Wants to know if she can go home.   Objective: Filed Vitals:   03/07/14 0943  BP: 152/79  Pulse: 99  Temp: 98.6 F (37 C)  Resp: 18    Intake/Output Summary (Last 24 hours) at 03/07/14 1157 Last data filed at 03/07/14 0851  Gross per 24 hour  Intake   1000 ml  Output      0 ml  Net   1000 ml   Filed Weights   03/07/14 0438  Weight: 101.878 kg (224 lb 9.6 oz)    Exam:   General:  Alert afebrile comfortable  Cardiovascular: s1s2  Respiratory: ctab, no wheezing heard.   Abdomen: soft NT ND BS+  Musculoskeletal: no pedal edema.   Data Reviewed: Basic Metabolic Panel:  Recent Labs Lab 03/06/14 2220 03/07/14 0350  NA 131* 139  K 3.7 3.2*  CL 92* 100  CO2 19 20  GLUCOSE 613* 426*  BUN 8 6  CREATININE 0.61 0.54  CALCIUM 9.1 8.7   Liver Function Tests:  Recent Labs Lab 03/06/14 2220 03/07/14 0350  AST 17 14  ALT 13 11  ALKPHOS 41 38*  BILITOT 0.3 0.3  PROT 7.5 7.0  ALBUMIN 3.5 3.2*   No results found for this basename: LIPASE, AMYLASE,  in the last 168 hours No results found for this basename: AMMONIA,  in the last 168 hours CBC:  Recent Labs Lab 03/06/14 2220 03/07/14 0350  WBC 14.9* 15.1*  NEUTROABS  --  10.4*  HGB 13.4 12.5  HCT 37.0 35.0*  MCV 82.8  82.4  PLT 238 193   Cardiac Enzymes:  Recent Labs Lab 03/07/14 0353  TROPONINI <0.30   BNP (last 3 results)  Recent Labs  03/06/14 2220  PROBNP 21.8   CBG:  Recent Labs Lab 03/07/14 0259 03/07/14 0359 03/07/14 0430 03/07/14 0620 03/07/14 0735  GLUCAP 377* 410* 421* 433* 347*    No results found for this or any previous visit (from the past 240 hour(s)).   Studies: Dg Chest 2 View  03/07/2014   CLINICAL DATA:  Shortness of breath for 1 week with cough and congestion.  EXAM: CHEST  2 VIEW  COMPARISON:  04/24/2013  FINDINGS: The heart size and mediastinal contours are within normal limits. Both lungs are clear. The visualized skeletal structures are unremarkable.  IMPRESSION: No active cardiopulmonary disease.   Electronically Signed   By: Lucienne Capers M.D.   On: 03/07/2014 00:22    Scheduled Meds: . enoxaparin (LOVENOX) injection  40 mg Subcutaneous Q24H  . ipratropium-albuterol  3 mL Nebulization Q4H  . levofloxacin (LEVAQUIN) IV  750 mg Intravenous Once  . [START ON 03/08/2014] levofloxacin (LEVAQUIN) IV  750 mg Intravenous Q24H   Continuous Infusions: . sodium chloride 100 mL/hr at 03/07/14 0430  . dextrose 5 % and  0.45% NaCl    . insulin (NOVOLIN-R) infusion 12.7 Units/hr (03/07/14 1005)    Principal Problem:   Acute asthma exacerbation Active Problems:   PAF (paroxysmal atrial fibrillation)   DM (diabetes mellitus)   Hypertension   DKA (diabetic ketoacidoses)    Time spent: 25 minutes.     [place] [facility] [facility] Pager [phone]. If 7PM-7AM, please contact night-coverage at [email or web address], password [facility] 03/07/2014, 11:57 AM  LOS: 0 days

## 2014-03-08 LAB — GLUCOSE, CAPILLARY
GLUCOSE-CAPILLARY: 211 mg/dL — AB (ref 70–99)
GLUCOSE-CAPILLARY: 163 mg/dL — AB (ref 70–99)
GLUCOSE-CAPILLARY: 129 mg/dL — AB (ref 70–99)
GLUCOSE-CAPILLARY: 117 mg/dL — AB (ref 70–99)
GLUCOSE-CAPILLARY: 122 mg/dL — AB (ref 70–99)
GLUCOSE-CAPILLARY: 115 mg/dL — AB (ref 70–99)
GLUCOSE-CAPILLARY: 318 mg/dL — AB (ref 70–99)
GLUCOSE-CAPILLARY: 219 mg/dL — AB (ref 70–99)
Glucose-Capillary: 102 mg/dL — ABNORMAL HIGH (ref 70–99)
Glucose-Capillary: 127 mg/dL — ABNORMAL HIGH (ref 70–99)
Glucose-Capillary: 219 mg/dL — ABNORMAL HIGH (ref 70–99)
Glucose-Capillary: 208 mg/dL — ABNORMAL HIGH (ref 70–99)
Glucose-Capillary: 406 mg/dL — ABNORMAL HIGH (ref 70–99)
Glucose-Capillary: 384 mg/dL — ABNORMAL HIGH (ref 70–99)
Glucose-Capillary: 258 mg/dL — ABNORMAL HIGH (ref 70–99)

## 2014-03-08 LAB — EXPECTORATED SPUTUM ASSESSMENT W GRAM STAIN, RFLX TO RESP C

## 2014-03-08 LAB — BASIC METABOLIC PANEL
Anion gap: 16 — ABNORMAL HIGH (ref 5–15)
Anion gap: 18 — ABNORMAL HIGH (ref 5–15)
BUN: 10 mg/dL (ref 6–23)
BUN: 15 mg/dL (ref 6–23)
CHLORIDE: 105 meq/L (ref 96–112)
CHLORIDE: 99 meq/L (ref 96–112)
CO2: 21 mEq/L (ref 19–32)
CO2: 17 meq/L — AB (ref 19–32)
Calcium: 9.1 mg/dL (ref 8.4–10.5)
Calcium: 9 mg/dL (ref 8.4–10.5)
Creatinine, Ser: 0.65 mg/dL (ref 0.50–1.10)
Creatinine, Ser: 0.75 mg/dL (ref 0.50–1.10)
GFR calc Af Amer: >90 mL/min (ref >90)
GFR calc Af Amer: >90 mL/min (ref >90)
GFR calc non Af Amer: >90 mL/min (ref >90)
GFR calc non Af Amer: >90 mL/min (ref >90)
GLUCOSE: 103 mg/dL — AB (ref 70–99)
GLUCOSE: 476 mg/dL — AB (ref 70–99)
POTASSIUM: 3.2 meq/L — AB (ref 3.7–5.3)
Potassium: 5.5 mEq/L — ABNORMAL HIGH (ref 3.7–5.3)
Sodium: 142 mEq/L (ref 137–147)
Sodium: 134 mEq/L — ABNORMAL LOW (ref 137–147)

## 2014-03-08 LAB — MAGNESIUM: Magnesium: 1.7 mg/dL (ref 1.5–2.5)

## 2014-03-08 LAB — RESPIRATORY VIRUS PANEL
ADENOVIRUS: NOT DETECTED
INFLUENZA B 1: NOT DETECTED
Influenza A H1: NOT DETECTED
Influenza A H3: NOT DETECTED
Influenza A: NOT DETECTED
Metapneumovirus: NOT DETECTED
PARAINFLUENZA 3 A: NOT DETECTED
Parainfluenza 1: NOT DETECTED
Parainfluenza 2: NOT DETECTED
Respiratory Syncytial Virus A: NOT DETECTED
Respiratory Syncytial Virus B: NOT DETECTED
Rhinovirus: DETECTED — AB

## 2014-03-08 LAB — EXPECTORATED SPUTUM ASSESSMENT W REFEX TO RESP CULTURE

## 2014-03-08 LAB — CBC
HEMATOCRIT: 34.2 % — AB (ref 36.0–46.0)
HEMOGLOBIN: 11.7 g/dL — AB (ref 12.0–15.0)
MCH: 28.4 pg (ref 26.0–34.0)
MCHC: 34.2 g/dL (ref 30.0–36.0)
MCV: 83 fL (ref 78.0–100.0)
Platelets: 224 10*3/uL (ref 150–400)
RBC: 4.12 MIL/uL (ref 3.87–5.11)
RDW: 13.3 % (ref 11.5–15.5)
WBC: 20.5 10*3/uL — AB (ref 4.0–10.5)

## 2014-03-08 LAB — STREP PNEUMONIAE URINARY ANTIGEN: STREP PNEUMO URINARY ANTIGEN: NEGATIVE

## 2014-03-08 MED ORDER — INSULIN REGULAR BOLUS VIA INFUSION
0.0000 [IU] | Freq: Three times a day (TID) | INTRAVENOUS | Status: DC
Start: 2014-03-09 — End: 2014-03-09
  Filled 2014-03-08: qty 10

## 2014-03-08 MED ORDER — POTASSIUM CHLORIDE CRYS ER 20 MEQ PO TBCR
40.0000 meq | EXTENDED_RELEASE_TABLET | Freq: Two times a day (BID) | ORAL | Status: AC
  Administered 2014-03-08 (×2): 40 meq via ORAL
  Filled 2014-03-08 (×2): qty 2

## 2014-03-08 MED ORDER — INSULIN GLARGINE 100 UNIT/ML ~~LOC~~ SOLN
10.0000 [IU] | Freq: Every day | SUBCUTANEOUS | Status: DC
  Administered 2014-03-08: 10 [IU] via SUBCUTANEOUS
  Filled 2014-03-08 (×3): qty 0.1

## 2014-03-08 MED ORDER — LEVOFLOXACIN 500 MG PO TABS
500.0000 mg | ORAL_TABLET | Freq: Every day | ORAL | Status: DC
  Administered 2014-03-08 – 2014-03-09 (×2): 500 mg via ORAL
  Filled 2014-03-08 (×2): qty 1

## 2014-03-08 MED ORDER — INSULIN STARTER KIT- SYRINGES (ENGLISH)
1.0000 | Freq: Once | Status: AC
  Administered 2014-03-08: 1
  Filled 2014-03-08: qty 1

## 2014-03-08 MED ORDER — DEXTROSE 50 % IV SOLN: 25.0000 mL | INTRAVENOUS | Status: DC | PRN

## 2014-03-08 MED ORDER — INSULIN ASPART 100 UNIT/ML ~~LOC~~ SOLN
0.0000 [IU] | Freq: Three times a day (TID) | SUBCUTANEOUS | Status: DC
  Administered 2014-03-08: 9 [IU] via SUBCUTANEOUS

## 2014-03-08 MED ORDER — WHITE PETROLATUM GEL
Status: AC
  Administered 2014-03-08: 0.2
  Filled 2014-03-08: qty 5

## 2014-03-08 MED ORDER — DEXTROSE-NACL 5-0.45 % IV SOLN
INTRAVENOUS | Status: DC
  Administered 2014-03-08 (×2): via INTRAVENOUS

## 2014-03-08 MED ORDER — IPRATROPIUM-ALBUTEROL 0.5-2.5 (3) MG/3ML IN SOLN: 3.0000 mL | Freq: Four times a day (QID) | RESPIRATORY_TRACT | Status: DC | PRN

## 2014-03-08 MED ORDER — SODIUM CHLORIDE 0.9 % IV SOLN
INTRAVENOUS | Status: DC
  Administered 2014-03-08: 20:00:00 via INTRAVENOUS

## 2014-03-08 MED ORDER — INSULIN ASPART 100 UNIT/ML ~~LOC~~ SOLN: 0.0000 [IU] | Freq: Every day | SUBCUTANEOUS | Status: DC

## 2014-03-08 MED ORDER — SODIUM CHLORIDE 0.9 % IV SOLN
INTRAVENOUS | Status: DC
  Administered 2014-03-08: 3.2 [IU]/h via INTRAVENOUS
  Administered 2014-03-08: 5.8 [IU]/h via INTRAVENOUS
  Administered 2014-03-09: 6.2 [IU]/h via INTRAVENOUS
  Filled 2014-03-08: qty 1

## 2014-03-08 NOTE — Progress Notes (Signed)
Inpatient Diabetes Program Recommendations  AACE/ADA: New Consensus Statement on Inpatient Glycemic Control (2013)  Target Ranges:  Prepandial:   less than 140 mg/dL      Peak postprandial:   less than 180 mg/dL (1-2 hours)      Critically ill patients:  140 - 180 mg/dL     Results for Anna Golden, Anna Golden (MRN [id number]) as of 03/08/2014 12:13  Ref. Range 03/06/2014 22:20  Glucose Latest Range: 70-99 mg/dL [facility])    Results for Anna Golden, Anna Golden (MRN [id number]) as of 03/08/2014 12:13  Ref. Range 03/07/2014 03:50  Hemoglobin A1C Latest Range: <5.7 % 11.3 (H)     Admitted with Asthma flare, Hyperglycemia.  Glucose 613 mg/dl on admission.  Home DM Meds: Glipizide bid (patient unsure of dose- Patient states she has not taken Glipizide in 1 month b/c she could not afford)   **Note patient started on IV insulin drip and given IVF.  Transitioned off IV insulin drip this AM to Lantus 10 units daily and Novolog Sensitive SSI.  Patient was given one time dose of 125 mg IV Solumedrol and started on Prednisone 40 mg daily.  **Spoke with patient by phone this AM.  Patient told me she hasn't taken her Glipizide in one month b/c she couldn't afford it (although this medication is on the $4 medication list at [facility]).  Hasn't checked her CBGs in over 2 weeks.  Patient also told me she missed her last 2 appointments at the [facility]. Spoke to patient about her current A1c of 11.3%.  Explained what an A1c is and what it measures.  Reminded patient that her goal A1c is 7% or less per ADA standards to prevent both acute and long-term complications.  Encouraged patient to check her CBGs at least bid at home (fasting and another check within the day) and to record all CBGs in a logbook for her PCP to review.  **Discussed with patient the possibility that the MD managing her care here in the hospital may want to send her home on insulin given her A1c was so high. Discussed  with patient that I will have the RN caring for her begin insulin education with her just in case the MD does decide to send her home on insulin.    MD- If you do decide to send patient home on insulin, please keep cost considerations in mind.  Patient has no health insurance and has limited financial means.  Would recommend Reli-on brand 70/30 insulin at [facility] that can be purchased for $25 per vial.    Will follow Wyn Quaker RN, MSN, CDE Diabetes Coordinator Inpatient Diabetes Program Team Pager: [phone] (8a-10p)

## 2014-03-08 NOTE — Progress Notes (Signed)
Attempted sputum sample x2 but not a usable sample per lab.  MD notified

## 2014-03-08 NOTE — Progress Notes (Signed)
[facility] PROGRESS NOTE  Anna Golden EHO:[id number] DOB: 25-Feb-1961 DOA: 03/07/2014 PCP: Angelica Chessman, MD  Assessment/Plan: 1. Acute asthma exacerbation: - improved. Not wheezing anymore. On prednisone taper.   2. Tobacco abuse: cousneling given.  Nicotine patch.   3. Mild DKA: Resolved. Gap is closed. Bicarbonate is 21.  Started on lantus and SSI.  CBG (last 3)   Recent Labs  03/08/14 0912 03/08/14 1028 03/08/14 1158  GLUCAP 127* 219* 208*      H/o a fib: - sinus currently.  On aspirin  Hypertension controlled.   Leukocytosis: Probably from steroids.   DVT prophylaxis.   Code Status: full code Family Communication: none at bedside.  Disposition Plan: TOMORROW.    Consultants: Diabetes education.  Procedures:  none  Antibiotics:  Levaquin.   HPI/Subjective: Breathing better.   Objective: Filed Vitals:   03/08/14 0913  BP: 123/82  Pulse: 94  Temp: 97.8 F (36.6 C)  Resp: 19    Intake/Output Summary (Last 24 hours) at 03/08/14 1533 Last data filed at 03/08/14 1428  Gross per 24 hour  Intake 1550.63 ml  Output    450 ml  Net 1100.63 ml   Filed Weights   03/07/14 0438 03/08/14 0300  Weight: 101.878 kg (224 lb 9.6 oz) 101.2 kg (223 lb 1.7 oz)    Exam:   General:  Alert afebrile comfortable  Cardiovascular: s1s2  Respiratory: ctab, no wheezing heard.   Abdomen: soft NT ND BS+  Musculoskeletal: no pedal edema.   Data Reviewed: Basic Metabolic Panel:  Recent Labs Lab 03/06/14 2220 03/07/14 0350 03/07/14 1130 03/08/14 0453  NA 131* 139 139 142  K 3.7 3.2* 3.8 3.2*  CL 92* 100 101 105  CO2 19 20 16* 21  GLUCOSE 613* 426* 295* 103*  BUN _0 CREATININE 0.61 0.54 0.53 0.65  CALCIUM 9.1 8.7 9.4 9.1  MG  --   --   --  1.7   Liver Function Tests:  Recent Labs Lab 03/06/14 2220 03/07/14 0350  AST 17 14  ALT 13 11  ALKPHOS 41 38*  BILITOT 0.3 0.3  PROT 7.5 7.0  ALBUMIN 3.5 3.2*   No  results found for this basename: LIPASE, AMYLASE,  in the last 168 hours No results found for this basename: AMMONIA,  in the last 168 hours CBC:  Recent Labs Lab 03/06/14 2220 03/07/14 0350 03/07/14 1130 03/08/14 0453  WBC 14.9* 15.1* 17.2* 20.5*  NEUTROABS  --  10.4*  --   --   HGB 13.4 12.5 13.1 11.7*  HCT 37.0 35.0* 36.9 34.2*  MCV 82.8 82.4 83.5 83.0  PLT 238 193 230 224   Cardiac Enzymes:  Recent Labs Lab 03/07/14 0353  TROPONINI <0.30   BNP (last 3 results)  Recent Labs  03/06/14 2220  PROBNP 21.8   CBG:  Recent Labs Lab 03/08/14 0658 03/08/14 0805 03/08/14 0912 03/08/14 1028 03/08/14 1158  GLUCAP 115* 102* 127* 219* 208*    Recent Results (from the past 240 hour(s))  CULTURE, EXPECTORATED SPUTUM-ASSESSMENT     Status: None   Collection Time    03/08/14 10:07 AM      Result Value Ref Range Status   Specimen Description SPUTUM   Final   Special Requests NONE   Final   Sputum evaluation     Final   Value: MICROSCOPIC FINDINGS SUGGEST THAT THIS SPECIMEN IS NOT REPRESENTATIVE OF LOWER RESPIRATORY SECRETIONS. PLEASE RECOLLECT.     NOTIFIED KELLY,K RN @  1119 03/08/14 LEONARD,A   Report Status 03/08/2014 FINAL   Final     Studies: Dg Chest 2 View  03/07/2014   CLINICAL DATA:  Shortness of breath for 1 week with cough and congestion.  EXAM: CHEST  2 VIEW  COMPARISON:  04/24/2013  FINDINGS: The heart size and mediastinal contours are within normal limits. Both lungs are clear. The visualized skeletal structures are unremarkable.  IMPRESSION: No active cardiopulmonary disease.   Electronically Signed   By: Lucienne Capers M.D.   On: 03/07/2014 00:22    Scheduled Meds: . insulin aspart  0-5 Units Subcutaneous QHS  . insulin aspart  0-9 Units Subcutaneous TID WC  . insulin glargine  10 Units Subcutaneous Daily  . insulin starter kit- syringes  1 kit Other Once  . levofloxacin  500 mg Oral Daily  . nicotine  21 mg Transdermal Daily  . potassium chloride   40 mEq Oral BID  . predniSONE  40 mg Oral QAC breakfast   Continuous Infusions:    Principal Problem:   Acute asthma exacerbation Active Problems:   PAF (paroxysmal atrial fibrillation)   DM (diabetes mellitus)   Hypertension   DKA (diabetic ketoacidoses)    Time spent: 25 minutes.     [place] [facility] Pager [phone]. If 7PM-7AM, please contact night-coverage at [email or web address], password [facility] 03/08/2014, 3:33 PM  LOS: 1 day

## 2014-03-09 LAB — BASIC METABOLIC PANEL
Anion gap: 14 (ref 5–15)
BUN: 11 mg/dL (ref 6–23)
CALCIUM: 8.5 mg/dL (ref 8.4–10.5)
CO2: 22 mEq/L (ref 19–32)
Chloride: 104 mEq/L (ref 96–112)
Creatinine, Ser: 0.61 mg/dL (ref 0.50–1.10)
GFR calc Af Amer: >90 mL/min (ref >90)
Glucose, Bld: 181 mg/dL — ABNORMAL HIGH (ref 70–99)
POTASSIUM: 3.9 meq/L (ref 3.7–5.3)
SODIUM: 140 meq/L (ref 137–147)

## 2014-03-09 LAB — GLUCOSE, CAPILLARY
GLUCOSE-CAPILLARY: 115 mg/dL — AB (ref 70–99)
GLUCOSE-CAPILLARY: 163 mg/dL — AB (ref 70–99)
GLUCOSE-CAPILLARY: 172 mg/dL — AB (ref 70–99)
Glucose-Capillary: 144 mg/dL — ABNORMAL HIGH (ref 70–99)
Glucose-Capillary: 146 mg/dL — ABNORMAL HIGH (ref 70–99)
Glucose-Capillary: 151 mg/dL — ABNORMAL HIGH (ref 70–99)
Glucose-Capillary: 164 mg/dL — ABNORMAL HIGH (ref 70–99)
Glucose-Capillary: 180 mg/dL — ABNORMAL HIGH (ref 70–99)
Glucose-Capillary: 183 mg/dL — ABNORMAL HIGH (ref 70–99)
Glucose-Capillary: 184 mg/dL — ABNORMAL HIGH (ref 70–99)
Glucose-Capillary: 213 mg/dL — ABNORMAL HIGH (ref 70–99)

## 2014-03-09 LAB — LEGIONELLA ANTIGEN, URINE: LEGIONELLA ANTIGEN, URINE: NEGATIVE

## 2014-03-09 MED ORDER — INSULIN GLARGINE 100 UNIT/ML ~~LOC~~ SOLN
10.0000 [IU] | Freq: Every day | SUBCUTANEOUS | Status: DC
  Administered 2014-03-09: 10 [IU] via SUBCUTANEOUS
  Filled 2014-03-09: qty 0.1

## 2014-03-09 MED ORDER — GLIPIZIDE 2.5 MG HALF TABLET
2.5000 mg | ORAL_TABLET | Freq: Two times a day (BID) | ORAL | Status: DC
  Administered 2014-03-09 (×2): 2.5 mg via ORAL
  Filled 2014-03-09 (×3): qty 1

## 2014-03-09 MED ORDER — INSULIN ASPART PROT & ASPART (70-30 MIX) 100 UNIT/ML ~~LOC~~ SUSP
10.0000 [IU] | Freq: Two times a day (BID) | SUBCUTANEOUS | Status: DC
  Filled 2014-03-09: qty 10

## 2014-03-09 MED ORDER — HYDROCOD POLST-CHLORPHEN POLST 10-8 MG/5ML PO LQCR: 5.0000 mL | Freq: Two times a day (BID) | ORAL | Status: DC | PRN

## 2014-03-09 MED ORDER — LEVOFLOXACIN 500 MG PO TABS: 500.0000 mg | ORAL_TABLET | Freq: Every day | ORAL | Status: DC

## 2014-03-09 MED ORDER — NICOTINE 21 MG/24HR TD PT24: 21.0000 mg | MEDICATED_PATCH | Freq: Every day | TRANSDERMAL | Status: DC

## 2014-03-09 MED ORDER — INSULIN ASPART 100 UNIT/ML ~~LOC~~ SOLN: SUBCUTANEOUS | Status: DC

## 2014-03-09 MED ORDER — INSULIN ASPART 100 UNIT/ML ~~LOC~~ SOLN: 0.0000 [IU] | Freq: Every day | SUBCUTANEOUS | Status: DC

## 2014-03-09 MED ORDER — INSULIN ASPART PROT & ASPART (70-30 MIX) 100 UNIT/ML ~~LOC~~ SUSP: 8.0000 [IU] | Freq: Two times a day (BID) | SUBCUTANEOUS | Status: DC

## 2014-03-09 MED ORDER — INSULIN ASPART 100 UNIT/ML ~~LOC~~ SOLN
0.0000 [IU] | Freq: Three times a day (TID) | SUBCUTANEOUS | Status: DC
  Administered 2014-03-09: 2 [IU] via SUBCUTANEOUS
  Administered 2014-03-09: 3 [IU] via SUBCUTANEOUS
  Administered 2014-03-09: 1 [IU] via SUBCUTANEOUS

## 2014-03-09 MED ORDER — METFORMIN HCL 500 MG PO TABS
500.0000 mg | ORAL_TABLET | Freq: Two times a day (BID) | ORAL | Status: DC
  Filled 2014-03-09 (×3): qty 1

## 2014-03-09 MED ORDER — GLIPIZIDE 2.5 MG HALF TABLET: 2.5000 mg | ORAL_TABLET | Freq: Two times a day (BID) | ORAL | Status: DC

## 2014-03-09 MED ORDER — ALBUTEROL SULFATE (2.5 MG/3ML) 0.083% IN NEBU: 2.5000 mg | INHALATION_SOLUTION | Freq: Four times a day (QID) | RESPIRATORY_TRACT | Status: DC | PRN

## 2014-03-09 NOTE — Progress Notes (Addendum)
Inpatient Diabetes Program Recommendations  AACE/ADA: New Consensus Statement on Inpatient Glycemic Control (2013)  Target Ranges:  Prepandial:   less than 140 mg/dL      Peak postprandial:   less than 180 mg/dL (1-2 hours)      Critically ill patients:  140 - 180 mg/dL     Results for Anna Golden, Anna Golden (MRN [id number]) as of 03/09/2014 09:22  Ref. Range 03/09/2014 00:34 03/09/2014 01:33 03/09/2014 02:38 03/09/2014 03:41 03/09/2014 04:44 03/09/2014 05:41 03/09/2014 06:33 03/09/2014 07:41  Glucose-Capillary Latest Range: 70-99 mg/dL 151 (H) 144 (H) 115 (H) 163 (H) 164 (H) 183 (H) 184 (H) 146 (H)     Late entry:  Spoke with patient yesterday about going home on insulin.  Patient hesitant to start insulin, however, she is willing to take insulin if needed.  Discussed A1c results with patient further and discussed how patient likely needs insulin to better control her CBGs to prevent any further chronic and acute complications from DM.  Reviewed contents of insulin syringe teaching kit with patient.  Reviewed signs and symptoms of hypoglycemia and how to treat at home.  Also reviewed CBG goals for home with patient and appropriate insulin injection sites.  Reminded patient that she will need to rotate injections to prevent scar tissue formation.  Showed patient how to draw up insulin in a syringe from a vial.  Patient able to provide successful return demonstration with prompting.  Have asked RNs to allow patient to practice drawing up and giving as many injections as possible before d/c.   **Note patient started on 70/30 insulin this AM.  Agree for affordability and ease of administration.    MD- At time of d/c, please make sure to give patient the following Rxs:  1. 70/30 insulin (Reli-on brand from [facility])- can be purchased for $25 per vial without insurance 2. Insulin syringes U-100 0.3 ml- Order number [id number] in EPIC   Will follow Wyn Quaker RN, MSN, CDE Diabetes  Coordinator Inpatient Diabetes Program Team Pager: [phone] (8a-10p)   Addendum 1215pm: Spoke with patient again about discharge plans for insulin for home.  Reviewed how to take 70/30 insulin at home and instructed patient to reduce her doses if she is unable to eat a full meal due to nausea, vomiting, illness etc.  Encouraged patient to purchase more strips for her CBG meter at home (patient has meter from [employer] that she bought OTC) and encouraged patient to check her CBGs at least bid at home and to keep a record.  Patient to follow up with [facility] after d/c.

## 2014-03-09 NOTE — Progress Notes (Signed)
Patient Discharge:  Disposition: Discharged home to private residence  Education: Educated pt on diagnosis, management, supplies and diet  IV: discontinued  Telemetry: Discontinued  Follow-up appointments: Educated pt on follow up with PCP in one week  Prescriptions: Scripts given to pt  Transportation: Pt transported self home in car  Belongings: Cell phone, clothing, other belongings taken with pt

## 2014-03-09 NOTE — Progress Notes (Signed)
Removed and returned telemetry box 7.  CCMD notified.

## 2014-03-09 NOTE — Clinical Social Work Psych Assess (Signed)
Clinical Social Work Department CLINICAL SOCIAL WORK PSYCHIATRY SERVICE LINE ASSESSMENT 03/09/2014  Patient:  Anna Golden  Account:  [id number]  [place] Date:  03/06/2014  Clinical Social Worker:  Wylene Men  Date/Time:  03/09/2014 01:31 PM Referred by:  Physician  Date referred:  03/09/2014 Reason for Referral  Psychosocial assessment  Other - See comment   Presenting Symptoms/Problems (In the person's/family's own words):   psych was consulted for PHQ9 score 22   Abuse/Neglect/Trauma History (check all that apply)  Denies history   Abuse/Neglect/Trauma Comments:   none pt denies   Psychiatric History (check all that apply)  Denies history   Psychiatric medications:  none   Current Mental Health Hospitalizations/Previous Mental Health History:   none reported  pt denies   Current provider:   [facility]   Place and Date:   ongoing   Current Medications:   Scheduled Meds:      . glipiZIDE  2.5 mg Oral BID AC  . insulin aspart  0-5 Units Subcutaneous QHS  . insulin aspart  0-9 Units Subcutaneous TID WC  . insulin aspart protamine- aspart  10 Units Subcutaneous BID WC  . levofloxacin  500 mg Oral Daily  . metFORMIN  500 mg Oral BID WC  . nicotine  21 mg Transdermal Daily        Continuous Infusions:      PRN Meds:.acetaminophen, acetaminophen, albuterol, chlorpheniramine-HYDROcodone, dextrose, ipratropium-albuterol, ondansetron (ZOFRAN) IV, ondansetron       Previous Impatient Admission/Date/Reason:   pt has been seen at [facility] frequently in 2014; however only 3 times in 2015.   Emotional Health / Current Symptoms    Suicide/Self Harm  None reported   Suicide attempt in the past:   Pt denies attempts and SI both past and present.  Pt states she feels worthless and dependent on others and don't want to be a burden, but denies SI.   Other harmful behavior:   none   Psychotic/Dissociative Symptoms  None reported   Other Psychotic/Dissociative  Symptoms:   none    Attention/Behavioral Symptoms  Withdrawn   Other Attention / Behavioral Symptoms:   none    Cognitive Impairment  Orientation - Place  Orientation - Self  Orientation - Situation  Orientation - Time   Other Cognitive Impairment:   none    Mood and Adjustment  DEPRESSION    Stress, Anxiety, Trauma, Any Recent Loss/Stressor  Anxiety   Anxiety (frequency):   Pt exhibits minimal anxiety and depression regarding her worsening medical conditions including diabetes.  Pt has been seen at [facility] and is new home insuline dependent upon dc.  Pt is having difficult time dealing with this change and feels that her physicial health is declining and feels helpless.  Psych CSW provided psychoeducation on proper diet, compliance and f/u and sustaining the pt's quality of life.   Phobia (specify):   none  pt denies   Compulsive behavior (specify):   none  pt denies   Obsessive behavior (specify):   none  pt denies   Other:   Pt also faces financial stressors as well expressing having no money for her medications - RNCM consulted   Substance Abuse/Use  None   SBIRT completed (please refer for detailed history):  N  Self-reported substance use:   none pt denies  untested   Urinary Drug Screen Completed:  N Alcohol level:   BAL untested  pt denies use     Who is in the home:  home alone independent prior to admission   Emergency contact:  son Lilli Light  [phone]    Patient's Strengths and Goals (patient's own words):   Pt has access to healthcare and is familiar with resources.   Clinical Social Worker's Interpretive Summary:   Psych CSW met with pt at bedside. Pt is alert and oriented and states that she will be dc'ing home today. Pt has been recently evaluated by diabetes coordinator who reports PHQ9 score of 22.    pt denies SI/HI as well as denies A/V/H/D.  Pt states that she was admitted with Shortness of Breath at which time her sugars  were found to be uncontrolled and was now going to be on home insuline.  Pt sadened by this news and feels that her health is rapidly declining.  Pt was offered psychoeducation on the relationship of complaince, appropriate f/u and quality of life.  Pt is agreeable to comply with f/u.  Pt states that she has not been taking some of her medications because she cannot afford medictions. Diabetes Coordinator has been speaking with pt and RNCM has been consulted for assistance.  Pt has outpatient f/u set up and is agreeable to comply.    Psych CSW is signing off at this time.   Disposition:  Psych Clinical Social Worker signing off  Nonnie Done, [place] [phone]  Clinical Social Work

## 2014-03-09 NOTE — Discharge Summary (Signed)
Physician Discharge Summary  Anna Golden FAO:[id number] DOB: 04-06-1961 DOA: 03/07/2014  PCP: Angelica Chessman, MD  Admit date: 03/07/2014 Discharge date: 03/09/2014  Time spent: 30  minutes  Recommendations for Outpatient Follow-up:  1. Follow up with PCP in one week.   Discharge Diagnoses:  Principal Problem:   Acute asthma exacerbation Active Problems:   PAF (paroxysmal atrial fibrillation)   DM (diabetes mellitus)   Hypertension   DKA (diabetic ketoacidoses)   Discharge Condition: improved.   Diet recommendation: diabetic diet.  Filed Weights   03/07/14 0438 03/08/14 0300 03/09/14 0500  Weight: 101.878 kg (224 lb 9.6 oz) 101.2 kg (223 lb 1.7 oz) 101.606 kg (224 lb)    History of present illness:  Anna Golden is a 53 y.o. female with Past medical history of diabetes mellitus, hypertension, asthma, sickle cell trait, atrial fibrillation admitted for asthma exacerbation and mild DKA.    Hospital Course:   1. Acute asthma exacerbation: - improved. Not wheezing anymore. On prednisone taper.  2. Tobacco abuse:  cousneling given.  Nicotine patch.  3. Mild DKA:  Resolved. Gap is closed. Bicarbonate is 21.  Started on lantus and SSI. Later on transitioned to novolog 70/30 as she could not get lantus.  CBG (last 3)   Recent Labs   03/08/14 0912  03/08/14 1028  03/08/14 1158   GLUCAP  127*  219*  208*    H/o a fib:  - sinus currently.  On aspirin  Hypertension controlled.  Leukocytosis:  Probably from steroids. Recommend checking in one week.   Procedures:  None   Consultations:  none  Discharge Exam: Filed Vitals:   03/09/14 0950  BP: 114/72  Pulse: 96  Temp: 98 F (36.7 C)  Resp: 19    General: alert afebrile comfortable Cardiovascular: s1s2 Respiratory: ctab  Discharge Instructions You were cared for by a hospitalist during your hospital stay. If you have any questions about your discharge medications or the care you received while  you were in the hospital after you are discharged, you can call the unit and asked to speak with the hospitalist on call if the hospitalist that took care of you is not available. Once you are discharged, your primary care physician will handle any further medical issues. Please note that NO REFILLS for any discharge medications will be authorized once you are discharged, as it is imperative that you return to your primary care physician (or establish a relationship with a primary care physician if you do not have one) for your aftercare needs so that they can reassess your need for medications and monitor your lab values.  Discharge Instructions   Diet Carb Modified    Complete by:  As directed      Discharge instructions    Complete by:  As directed   Follow up with PCP in one week. Please check cbgs atleast 2 times a day.            Medication List         albuterol (2.5 MG/3ML) 0.083% nebulizer solution  Commonly known as:  PROVENTIL  Take 3 mLs (2.5 mg total) by nebulization every 6 (six) hours as needed for wheezing or shortness of breath.     chlorpheniramine-HYDROcodone 10-8 MG/5ML Lqcr  Commonly known as:  TUSSIONEX  Take 5 mLs by mouth every 12 (twelve) hours as needed for cough (cough).     glipiZIDE 2.5 mg Tabs tablet  Commonly known as:  GLUCOTROL  Take 0.5  tablets (2.5 mg total) by mouth 2 (two) times daily before a meal.     insulin aspart 100 UNIT/ML injection  Commonly known as:  novoLOG  - CBG 70 - 120: 0 units  - CBG 121 - 150: 1 unit  - CBG 151 - 200: 2 units  - CBG 201 - 250: 3 units  - CBG 251 - 300: 5 units  - CBG 301 - 350: 7 units  - CBG 351 - 400: 9 units     insulin aspart protamine- aspart (70-30) 100 UNIT/ML injection  Commonly known as:  NOVOLOG MIX 70/30  Inject 0.08 mLs (8 Units total) into the skin 2 (two) times daily with a meal.     levofloxacin 500 MG tablet  Commonly known as:  LEVAQUIN  Take 1 tablet (500 mg total) by mouth  daily.     neomycin-bacitracin-polymyxin ointment  Commonly known as:  NEOSPORIN  Apply 1 application topically daily as needed (for excema).     nicotine 21 mg/24hr patch  Commonly known as:  NICODERM CQ - dosed in mg/24 hours  Place 1 patch (21 mg total) onto the skin daily.       Allergies  Allergen Reactions  . Metoprolol Other (See Comments)    "Patient slept for whole day after starting medication this week.  Patient does not want to take this med.  Not sure if med causes hypotension, but could not get out of bed for any reason"  . Other Other (See Comments)    ANESTHESIA-CAUSES CARDIAC ARREST AND LUNGS COLLAPSED IN 1988, 2008.    . Latex Itching    Swell up  . Pork-Derived Products Palpitations and Other (See Comments)    Also raises Blood pressure-Patient avoids pork and pork products      The results of significant diagnostics from this hospitalization (including imaging, microbiology, ancillary and laboratory) are listed below for reference.    Significant Diagnostic Studies: Dg Chest 2 View  03/07/2014   CLINICAL DATA:  Shortness of breath for 1 week with cough and congestion.  EXAM: CHEST  2 VIEW  COMPARISON:  04/24/2013  FINDINGS: The heart size and mediastinal contours are within normal limits. Both lungs are clear. The visualized skeletal structures are unremarkable.  IMPRESSION: No active cardiopulmonary disease.   Electronically Signed   By: Lucienne Capers M.D.   On: 03/07/2014 00:22    Microbiology: Recent Results (from the past 240 hour(s))  RESPIRATORY VIRUS PANEL     Status: Abnormal   Collection Time    03/07/14  6:04 AM      Result Value Ref Range Status   Source - RVPAN NASAL SWAB   Corrected   Comment: CORRECTED ON 07/13 AT 2014: PREVIOUSLY REPORTED AS NASAL SWAB   Respiratory Syncytial Virus A NOT DETECTED   Final   Respiratory Syncytial Virus B NOT DETECTED   Final   Influenza A NOT DETECTED   Final   Influenza B NOT DETECTED   Final    Parainfluenza 1 NOT DETECTED   Final   Parainfluenza 2 NOT DETECTED   Final   Parainfluenza 3 NOT DETECTED   Final   Metapneumovirus NOT DETECTED   Final   Rhinovirus DETECTED (*)  Final   Adenovirus NOT DETECTED   Final   Influenza A H1 NOT DETECTED   Final   Influenza A H3 NOT DETECTED   Final   Comment: (NOTE)           Normal  Reference Range for each Analyte: NOT DETECTED     Testing performed using the Luminex xTAG Respiratory Viral Panel test     kit.     This test was developed and its performance characteristics determined     by [employer]. It has not been cleared or approved by the [place]     Food and Drug Administration. This test is used for clinical purposes.     It should not be regarded as investigational or for research. This     laboratory is certified under the [place] (CLIA) as qualified to perform high complexity     clinical laboratory testing.     Performed at [place], EXPECTORATED SPUTUM-ASSESSMENT     Status: None   Collection Time    03/08/14 10:07 AM      Result Value Ref Range Status   Specimen Description SPUTUM   Final   Special Requests NONE   Final   Sputum evaluation     Final   Value: MICROSCOPIC FINDINGS SUGGEST THAT THIS SPECIMEN IS NOT REPRESENTATIVE OF LOWER RESPIRATORY SECRETIONS. PLEASE RECOLLECT.     NOTIFIED KELLY,K RN @ 1119 03/08/14 LEONARD,A   Report Status 03/08/2014 FINAL   Final  CULTURE, EXPECTORATED SPUTUM-ASSESSMENT     Status: None   Collection Time    03/08/14  4:21 PM      Result Value Ref Range Status   Specimen Description SPUTUM   Final   Special Requests NONE   Final   Sputum evaluation     Final   Value: MICROSCOPIC FINDINGS SUGGEST THAT THIS SPECIMEN IS NOT REPRESENTATIVE OF LOWER RESPIRATORY SECRETIONS. PLEASE RECOLLECT.     NOTIFIED A LARSON RN 1700 03/08/14 A BROWNING   Report Status 03/08/2014 FINAL   Final     Labs: Basic Metabolic  Panel:  Recent Labs Lab 03/07/14 0350 03/07/14 1130 03/08/14 0453 03/08/14 1710 03/09/14 0509  NA 139 139 142 134* 140  K 3.2* 3.8 3.2* 5.5* 3.9  CL 100 101 105 99 104  CO2 20 16* 21 17* 22  GLUCOSE 426* 295* 103* 476* 181*  BUN 6 6 10 15 11   CREATININE 0.54 0.53 0.65 0.75 0.61  CALCIUM 8.7 9.4 9.1 9.0 8.5  MG  --   --  1.7  --   --    Liver Function Tests:  Recent Labs Lab 03/06/14 2220 03/07/14 0350  AST 17 14  ALT 13 11  ALKPHOS 41 38*  BILITOT 0.3 0.3  PROT 7.5 7.0  ALBUMIN 3.5 3.2*   No results found for this basename: LIPASE, AMYLASE,  in the last 168 hours No results found for this basename: AMMONIA,  in the last 168 hours CBC:  Recent Labs Lab 03/06/14 2220 03/07/14 0350 03/07/14 1130 03/08/14 0453  WBC 14.9* 15.1* 17.2* 20.5*  NEUTROABS  --  10.4*  --   --   HGB 13.4 12.5 13.1 11.7*  HCT 37.0 35.0* 36.9 34.2*  MCV 82.8 82.4 83.5 83.0  PLT 238 193 230 224   Cardiac Enzymes:  Recent Labs Lab 03/07/14 0353  TROPONINI <0.30   BNP: BNP (last 3 results)  Recent Labs  03/06/14 2220  PROBNP 21.8   CBG:  Recent Labs Lab 03/09/14 0444 03/09/14 0541 03/09/14 0633 03/09/14 0741 03/09/14 1207  GLUCAP 164* 183* 184* 146* 180*       Signed:  Shawniece Oyola  [facility] 03/09/2014, 4:37 PM

## 2014-03-09 NOTE — Progress Notes (Signed)
Reinforced prior education on insulin with pt. Pt was able to draw up afternoon insulin and give self injection. Will continue to monitor.

## 2014-03-18 ENCOUNTER — Encounter: Payer: Self-pay | Admitting: Internal Medicine

## 2014-03-18 ENCOUNTER — Telehealth: Payer: Self-pay | Admitting: Internal Medicine

## 2014-03-18 ENCOUNTER — Ambulatory Visit: Payer: Self-pay | Attending: Internal Medicine | Admitting: Internal Medicine

## 2014-03-18 ENCOUNTER — Telehealth: Payer: Self-pay

## 2014-03-18 VITALS — BP 117/81 | HR 91 | Temp 98.1°F | Resp 16 | Wt 227.0 lb

## 2014-03-18 DIAGNOSIS — E089 Diabetes mellitus due to underlying condition without complications: Secondary | ICD-10-CM

## 2014-03-18 DIAGNOSIS — E119 Type 2 diabetes mellitus without complications: Secondary | ICD-10-CM | POA: Insufficient documentation

## 2014-03-18 DIAGNOSIS — IMO0001 Reserved for inherently not codable concepts without codable children: Secondary | ICD-10-CM

## 2014-03-18 DIAGNOSIS — Z794 Long term (current) use of insulin: Secondary | ICD-10-CM | POA: Insufficient documentation

## 2014-03-18 DIAGNOSIS — J4541 Moderate persistent asthma with (acute) exacerbation: Secondary | ICD-10-CM

## 2014-03-18 DIAGNOSIS — I10 Essential (primary) hypertension: Secondary | ICD-10-CM

## 2014-03-18 DIAGNOSIS — Z79899 Other long term (current) drug therapy: Secondary | ICD-10-CM | POA: Insufficient documentation

## 2014-03-18 DIAGNOSIS — F17200 Nicotine dependence, unspecified, uncomplicated: Secondary | ICD-10-CM

## 2014-03-18 DIAGNOSIS — Z791 Long term (current) use of non-steroidal anti-inflammatories (NSAID): Secondary | ICD-10-CM | POA: Insufficient documentation

## 2014-03-18 DIAGNOSIS — E139 Other specified diabetes mellitus without complications: Secondary | ICD-10-CM

## 2014-03-18 DIAGNOSIS — I480 Paroxysmal atrial fibrillation: Secondary | ICD-10-CM

## 2014-03-18 DIAGNOSIS — I4891 Unspecified atrial fibrillation: Secondary | ICD-10-CM

## 2014-03-18 DIAGNOSIS — J45901 Unspecified asthma with (acute) exacerbation: Secondary | ICD-10-CM

## 2014-03-18 DIAGNOSIS — M129 Arthropathy, unspecified: Secondary | ICD-10-CM

## 2014-03-18 DIAGNOSIS — J4521 Mild intermittent asthma with (acute) exacerbation: Secondary | ICD-10-CM

## 2014-03-18 DIAGNOSIS — D571 Sickle-cell disease without crisis: Secondary | ICD-10-CM | POA: Insufficient documentation

## 2014-03-18 DIAGNOSIS — M1990 Unspecified osteoarthritis, unspecified site: Secondary | ICD-10-CM

## 2014-03-18 DIAGNOSIS — Z139 Encounter for screening, unspecified: Secondary | ICD-10-CM

## 2014-03-18 HISTORY — DX: Nicotine dependence, unspecified, uncomplicated: F17.200

## 2014-03-18 HISTORY — DX: Reserved for inherently not codable concepts without codable children: IMO0001

## 2014-03-18 LAB — GLUCOSE, POCT (MANUAL RESULT ENTRY): POC Glucose: 288 mg/dl — AB (ref 70–99)

## 2014-03-18 MED ORDER — NAPROXEN 500 MG PO TABS: 500.0000 mg | ORAL_TABLET | Freq: Two times a day (BID) | ORAL | Status: DC

## 2014-03-18 MED ORDER — INSULIN ASPART 100 UNIT/ML ~~LOC~~ SOLN
10.0000 [IU] | Freq: Once | SUBCUTANEOUS | Status: AC
  Administered 2014-03-18: 10 [IU] via SUBCUTANEOUS

## 2014-03-18 NOTE — Telephone Encounter (Signed)
Pt had HFU appt today and did not realize before appt that the albuterol (PROVENTIL) (2.5 MG/3ML) 0.083% solution that was prescribed in hospital is not an inhaler and requires a nebulizer. Pt requesting assistance as to how to take medication or if inhaler can be prescribed. Please f/u w/ pt.

## 2014-03-18 NOTE — Telephone Encounter (Signed)
Patient was prescribed albuterol for nebulizer but does not have A machine Prescription written for nebulizer

## 2014-03-18 NOTE — Progress Notes (Signed)
Patient here for hospital follow up Was admitted for high blood sugars Has not had her medications in over a month Blood sugar this am was a fasting glucose

## 2014-03-18 NOTE — Progress Notes (Signed)
MRN: [id number] Name: Anna Golden  Sex: female Age: 53 y.o. DOB: 1961-01-25  Allergies: Metoprolol; Other; Latex; and Pork-derived products  Chief Complaint  Patient presents with  . Hospitalization Follow-up    HPI: Patient is 53 y.o. female who  has history of diabetes hypertension asthma A. fib, recently hospitalized with cough shortness of breath, EMR reviewed patient also had worsening symptoms of polyuria polydipsia weight loss, patient was not taking medication prior to the admission since she could not afford, she was treated for acute asthma exacerbation treated with IV antibiotics, she also was in DKA with anion gap of 21 treated with IV insulin and hydration, she was resumed back on her medications. Today her blood sugars are elevated since she has not taken the medication, denies any fevers chills chest pain shortness of breath has occasional wheezing. 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   Past Medical History  Diagnosis Date  . Diabetes mellitus   . Hypertension   . Arthritis   . Hypokalemia   . Asthma   . Sickle cell trait   . Atrial fibrillation     a. Dx 03/2013.    Past Surgical History  Procedure Laterality Date  . Hernia repair    . Appendectomy        Medication List       This list is accurate as of: 03/18/14  9:46 AM.  Always use your most recent med list.               albuterol (2.5 MG/3ML) 0.083% nebulizer solution  Commonly known as:  PROVENTIL  Take 3 mLs (2.5 mg total) by nebulization every 6 (six) hours as needed for wheezing or shortness of breath.     chlorpheniramine-HYDROcodone 10-8 MG/5ML Lqcr  Commonly known as:  TUSSIONEX  Take 5 mLs by mouth every 12 (twelve) hours as needed for cough (cough).     glipiZIDE 2.5 mg Tabs tablet  Commonly known as:  GLUCOTROL  Take 0.5 tablets (2.5 mg total) by mouth 2 (two) times daily before a meal.     insulin aspart 100  UNIT/ML injection  Commonly known as:  novoLOG  - CBG 70 - 120: 0 units  - CBG 121 - 150: 1 unit  - CBG 151 - 200: 2 units  - CBG 201 - 250: 3 units  - CBG 251 - 300: 5 units  - CBG 301 - 350: 7 units  - CBG 351 - 400: 9 units     insulin aspart protamine- aspart (70-30) 100 UNIT/ML injection  Commonly known as:  NOVOLOG MIX 70/30  Inject 0.08 mLs (8 Units total) into the skin 2 (two) times daily with a meal.     levofloxacin 500 MG tablet  Commonly known as:  LEVAQUIN  Take 1 tablet (500 mg total) by mouth daily.     naproxen 500 MG tablet  Commonly known as:  NAPROSYN  Take 1 tablet (500 mg total) by mouth 2 (two) times daily with a meal.     neomycin-bacitracin-polymyxin ointment  Commonly known as:  NEOSPORIN  Apply 1 application topically daily as needed (for excema).     nicotine 21 mg/24hr patch  Commonly known as:  NICODERM CQ - dosed in mg/24 hours  Place 1 patch (21 mg total) onto the skin daily.        Meds ordered this encounter  Medications  . insulin aspart (novoLOG)  injection 10 Units    Sig:   . naproxen (NAPROSYN) 500 MG tablet    Sig: Take 1 tablet (500 mg total) by mouth 2 (two) times daily with a meal.    Dispense:  30 tablet    Refill:  2    Immunization History  Administered Date(s) Administered  . Influenza Split 04/29/2013    Family History  Problem Relation Age of Onset  . Heart disease Mother   . Diabetes Mother   . Sickle cell anemia Mother   . Cancer Father     colon    History  Substance Use Topics  . Smoking status: Current Every Day Smoker -- 0.25 packs/day    Types: Cigarettes  . Smokeless tobacco: Never Used  . Alcohol Use: No    Review of Systems   As noted in HPI  Filed Vitals:   03/18/14 0907  BP: 117/81  Pulse: 91  Temp: 98.1 F (36.7 C)  Resp: 16    Physical Exam  Physical Exam  Constitutional: She is oriented to person, place, and time. No distress.  Eyes: EOM are normal. Pupils are equal,  round, and reactive to light.  Cardiovascular: Normal rate and regular rhythm.   Pulmonary/Chest: Breath sounds normal. No respiratory distress. She has no rales.  Minimal wheezing  Abdominal: Soft. There is no tenderness. There is no rebound.  Musculoskeletal: She exhibits no edema.  Neurological: She is alert and oriented to person, place, and time.    CBC    Component Value Date/Time   WBC 20.5* 03/08/2014 0453   RBC 4.12 03/08/2014 0453   HGB 11.7* 03/08/2014 0453   HCT 34.2* 03/08/2014 0453   PLT 224 03/08/2014 0453   MCV 83.0 03/08/2014 0453   LYMPHSABS 3.8 03/07/2014 0350   MONOABS 0.6 03/07/2014 0350   EOSABS 0.2 03/07/2014 0350   BASOSABS 0.0 03/07/2014 0350    CMP     Component Value Date/Time   NA 140 03/09/2014 0509   K 3.9 03/09/2014 0509   CL 104 03/09/2014 0509   CO2 22 03/09/2014 0509   GLUCOSE 181* 03/09/2014 0509   BUN 11 03/09/2014 0509   CREATININE 0.61 03/09/2014 0509   CREATININE 0.72 04/22/2013 1036   CALCIUM 8.5 03/09/2014 0509   PROT 7.0 03/07/2014 0350   ALBUMIN 3.2* 03/07/2014 0350   AST 14 03/07/2014 0350   ALT 11 03/07/2014 0350   ALKPHOS 38* 03/07/2014 0350   BILITOT 0.3 03/07/2014 0350   GFRNONAA >90 03/09/2014 0509   GFRAA >90 03/09/2014 0509    Lab Results  Component Value Date/Time   CHOL 111 04/25/2013  5:00 AM    No components found with this basename: hga1c    Lab Results  Component Value Date/Time   AST 14 03/07/2014  3:50 AM    Assessment and Plan  Diabetes mellitus due to underlying condition without complications - Plan: Glucose (CBG), insulin aspart (novoLOG) injection 10 Units  Acute asthma exacerbation, mild intermittent Patient is on albuterol prn   Essential hypertension/PAF (paroxysmal atrial fibrillation) Well controlled patient is on cardizem and ASA  Smoking Patient has prescription for nicotine patch   Arthritis - Plan: naproxen (NAPROSYN) 500 MG tablet  Screening - Plan: Ordered fasting blood work Lipid panel, CBC with  Differential, COMPLETE METABOLIC PANEL WITH GFR, TSH, Vit D  25 hydroxy (rtn osteoporosis monitoring)   Return in about 3 months (around 06/18/2014) for diabetes, hypertension, CBG check in 3 weeks/Nurse Visit.  Lorayne Marek, MD

## 2014-04-01 ENCOUNTER — Other Ambulatory Visit: Payer: Self-pay

## 2014-04-01 ENCOUNTER — Ambulatory Visit: Payer: Self-pay | Attending: Internal Medicine

## 2014-04-01 MED ORDER — INSULIN ASPART PROT & ASPART (70-30 MIX) 100 UNIT/ML ~~LOC~~ SUSP: 8.0000 [IU] | Freq: Two times a day (BID) | SUBCUTANEOUS | Status: DC

## 2014-04-06 ENCOUNTER — Telehealth: Payer: Self-pay | Admitting: Internal Medicine

## 2014-04-06 NOTE — Telephone Encounter (Signed)
Is this ok for [place] to write for patient when she comes in on 04/08/2014. Pt is currently staying in shelter and due to arthiritis is having a hard time getting to a from her bed because it is located on 2nd floor. Pt needs a Dr's note stating she has medical reason for requesting different accommodations .

## 2014-04-06 NOTE — Telephone Encounter (Signed)
Pt is currently staying in shelter and due to arthiritis is having a hard time getting to a from her bed because it is located on 2nd floor.  Pt needs a Dr's note stating she has medical reason for requesting different accomodations. Pt has a nurse appt on 04/08/14 but wanted to call ahead so that it might be ready the day of her appt. Please f/u with pt.

## 2014-04-08 ENCOUNTER — Ambulatory Visit: Payer: Self-pay | Attending: Internal Medicine

## 2014-04-08 ENCOUNTER — Other Ambulatory Visit: Payer: Self-pay

## 2014-04-08 ENCOUNTER — Telehealth: Payer: Self-pay | Admitting: *Deleted

## 2014-04-08 DIAGNOSIS — E119 Type 2 diabetes mellitus without complications: Secondary | ICD-10-CM

## 2014-04-08 DIAGNOSIS — Z139 Encounter for screening, unspecified: Secondary | ICD-10-CM

## 2014-04-08 LAB — CBC WITH DIFFERENTIAL/PLATELET
BASOS ABS: 0 10*3/uL (ref 0.0–0.1)
BASOS PCT: 0 % (ref 0–1)
Eosinophils Absolute: 0.4 10*3/uL (ref 0.0–0.7)
Eosinophils Relative: 4 % (ref 0–5)
HCT: 36.2 % (ref 36.0–46.0)
Hemoglobin: 12.4 g/dL (ref 12.0–15.0)
Lymphocytes Relative: 33 % (ref 12–46)
Lymphs Abs: 3.5 10*3/uL (ref 0.7–4.0)
MCH: 28.8 pg (ref 26.0–34.0)
MCHC: 34.3 g/dL (ref 30.0–36.0)
MCV: 84 fL (ref 78.0–100.0)
Monocytes Absolute: 0.5 10*3/uL (ref 0.1–1.0)
Monocytes Relative: 5 % (ref 3–12)
NEUTROS ABS: 6.1 10*3/uL (ref 1.7–7.7)
Neutrophils Relative %: 58 % (ref 43–77)
Platelets: 304 10*3/uL (ref 150–400)
RBC: 4.31 MIL/uL (ref 3.87–5.11)
RDW: 14.6 % (ref 11.5–15.5)
WBC: 10.5 10*3/uL (ref 4.0–10.5)

## 2014-04-08 LAB — GLUCOSE, POCT (MANUAL RESULT ENTRY): POC Glucose: 225 mg/dl — AB (ref 70–99)

## 2014-04-08 NOTE — Patient Instructions (Signed)

## 2014-04-08 NOTE — Progress Notes (Signed)
Patient here for CBG check. Patient states her blood sugar is running between mid 100's to the high 200's. Patient states she is taking her medications as prescribed. Patient is keeping a log of her blood sugar.

## 2014-04-08 NOTE — Telephone Encounter (Signed)
Pt is currently staying in shelter and due to arthiritis is having a hard time getting to a from her bed because it is located on 2nd floor. Pt needs a Dr's note stating she has medical reason for requesting different accommodations. Is this ok to write for patient?

## 2014-04-09 LAB — COMPLETE METABOLIC PANEL WITH GFR
ALK PHOS: 31 U/L — AB (ref 39–117)
ALT: 13 U/L (ref 0–35)
AST: 16 U/L (ref 0–37)
Albumin: 3.6 g/dL (ref 3.5–5.2)
BUN: 6 mg/dL (ref 6–23)
CO2: 25 mEq/L (ref 19–32)
Calcium: 9 mg/dL (ref 8.4–10.5)
Chloride: 106 mEq/L (ref 96–112)
Creat: 0.74 mg/dL (ref 0.50–1.10)
GFR, Est African American: >89 mL/min
GFR, Est Non African American: >89 mL/min
Glucose, Bld: 192 mg/dL — ABNORMAL HIGH (ref 70–99)
Potassium: 4 mEq/L (ref 3.5–5.3)
SODIUM: 139 meq/L (ref 135–145)
TOTAL PROTEIN: 6.2 g/dL (ref 6.0–8.3)
Total Bilirubin: 0.3 mg/dL (ref 0.2–1.2)

## 2014-04-09 LAB — LIPID PANEL
CHOL/HDL RATIO: 2.5 ratio
Cholesterol: 105 mg/dL (ref 0–200)
HDL: 42 mg/dL (ref >39)
LDL CALC: 45 mg/dL (ref 0–99)
Triglycerides: 92 mg/dL (ref <150)
VLDL: 18 mg/dL (ref 0–40)

## 2014-04-09 LAB — TSH: TSH: 1.267 u[IU]/mL (ref 0.350–4.500)

## 2014-04-09 LAB — VITAMIN D 25 HYDROXY (VIT D DEFICIENCY, FRACTURES): Vit D, 25-Hydroxy: 18 ng/mL — ABNORMAL LOW (ref 30–89)

## 2014-04-09 NOTE — Telephone Encounter (Signed)
Pt. Calling again requesting note from doctor. Please f/u with pt.

## 2014-04-09 NOTE — Telephone Encounter (Signed)
Pt. returning call with fax number for drs note- [phone].

## 2014-04-12 ENCOUNTER — Telehealth: Payer: Self-pay | Admitting: *Deleted

## 2014-04-12 MED ORDER — VITAMIN D (ERGOCALCIFEROL) 1.25 MG (50000 UNIT) PO CAPS: 50000.0000 [IU] | ORAL_CAPSULE | ORAL | Status: DC

## 2014-04-12 NOTE — Telephone Encounter (Signed)
Message copied by Dorothe Pea on Mon Apr 12, 2014  2:06 PM ------      Message from: Lorayne Marek      Created: Fri Apr 09, 2014 12:48 PM       Blood work reviewed, noticed low vitamin D, call patient advise to start ergocalciferol 50,000 units once a week for the duration of  12 weeks.       ------

## 2014-04-12 NOTE — Telephone Encounter (Signed)
Pt is aware of her lab results and I sent the vit D rx to [facility].

## 2014-04-12 NOTE — Telephone Encounter (Signed)
Message copied by Joan Mayans on Mon Apr 12, 2014 12:17 PM ------      Message from: Lorayne Marek      Created: Fri Apr 09, 2014 12:48 PM       Blood work reviewed, noticed low vitamin D, call patient advise to start ergocalciferol 50,000 units once a week for the duration of  12 weeks.       ------

## 2014-04-26 ENCOUNTER — Other Ambulatory Visit: Payer: Self-pay | Admitting: *Deleted

## 2014-04-26 ENCOUNTER — Other Ambulatory Visit (HOSPITAL_COMMUNITY): Payer: Self-pay | Admitting: Physician Assistant

## 2014-04-26 DIAGNOSIS — E1110 Type 2 diabetes mellitus with ketoacidosis without coma: Secondary | ICD-10-CM

## 2014-04-26 MED ORDER — INSULIN ASPART PROT & ASPART (70-30 MIX) 100 UNIT/ML ~~LOC~~ SUSP: 8.0000 [IU] | Freq: Two times a day (BID) | SUBCUTANEOUS | Status: DC

## 2014-04-28 ENCOUNTER — Other Ambulatory Visit (HOSPITAL_COMMUNITY): Payer: Self-pay | Admitting: Physician Assistant

## 2014-05-07 ENCOUNTER — Ambulatory Visit: Payer: Self-pay

## 2014-05-14 ENCOUNTER — Other Ambulatory Visit: Payer: Self-pay

## 2014-05-24 ENCOUNTER — Ambulatory Visit (INDEPENDENT_AMBULATORY_CARE_PROVIDER_SITE_OTHER): Payer: Self-pay | Admitting: Cardiovascular Disease

## 2014-05-24 ENCOUNTER — Other Ambulatory Visit: Payer: Self-pay

## 2014-05-24 ENCOUNTER — Encounter: Payer: Self-pay | Admitting: Cardiovascular Disease

## 2014-05-24 VITALS — BP 114/82 | HR 103 | Ht 62.9 in | Wt 231.8 lb

## 2014-05-24 DIAGNOSIS — E1065 Type 1 diabetes mellitus with hyperglycemia: Secondary | ICD-10-CM

## 2014-05-24 DIAGNOSIS — I4891 Unspecified atrial fibrillation: Secondary | ICD-10-CM

## 2014-05-24 DIAGNOSIS — I480 Paroxysmal atrial fibrillation: Secondary | ICD-10-CM

## 2014-05-24 DIAGNOSIS — I209 Angina pectoris, unspecified: Secondary | ICD-10-CM

## 2014-05-24 DIAGNOSIS — I10 Essential (primary) hypertension: Secondary | ICD-10-CM

## 2014-05-24 DIAGNOSIS — E109 Type 1 diabetes mellitus without complications: Secondary | ICD-10-CM

## 2014-05-24 DIAGNOSIS — R079 Chest pain, unspecified: Secondary | ICD-10-CM

## 2014-05-24 MED ORDER — GLIPIZIDE 2.5 MG HALF TABLET: 2.5000 mg | ORAL_TABLET | Freq: Two times a day (BID) | ORAL | Status: DC

## 2014-05-24 MED ORDER — DILTIAZEM HCL ER COATED BEADS 120 MG PO CP24: 120.0000 mg | ORAL_CAPSULE | Freq: Every day | ORAL | Status: DC

## 2014-05-24 NOTE — Assessment & Plan Note (Signed)
This is her biggest issue [facility]u [facility] See if they can arrange opthamology visit.

## 2014-05-24 NOTE — Progress Notes (Signed)
Patient ID: Anna Golden, female   DOB: 03-Nov-1960, 53 y.o.   MRN: [id number] Patient is 53 y.o. female who has history of diabetes hypertension asthma A. fib, recently hospitalized with cough shortness of breath, EMR reviewed patient also had worsening symptoms of polyuria polydipsia weight loss, patient was not taking medication prior to the admission since she could not afford, she was treated for acute asthma exacerbation treated with IV antibiotics, she also was in DKA with anion gap of 21 treated with IV insulin and hydration, she was resumed back on her medications. Being seen at [facility] and living in shelter    F/U nuclear study done normal with no ischemia  05/05/13  She describes infrequent palpitations  She has a cough and URI now.  No fever indicates taking better care of her BS now running in low 100's  Needs refill on cardizem  Discussed anticoagulation and she could not afford xarelto so stopped it last year after first bottle     ROS: Denies fever, malais, weight loss, blurry vision, decreased visual acuity, cough, sputum, SOB, hemoptysis, pleuritic pain, palpitaitons, heartburn, abdominal pain, melena, lower extremity edema, claudication, or rash.  All other systems reviewed and negative   General: Affect appropriate Obese black female  HEENT: normal Neck supple with no adenopathy JVP normal no bruits no thyromegaly Lungs diffuse rhonchi and  wheezing and good diaphragmatic motion Heart:  S1/S2 no murmur,rub, gallop or click PMI normal Abdomen: benighn, BS positve, no tenderness, no AAA no bruit.  No HSM or HJR Distal pulses intact with no bruits No edema Neuro non-focal Skin warm and dry No muscular weakness  Medications Current Outpatient Prescriptions  Medication Sig Dispense Refill  . glipiZIDE (GLUCOTROL) 2.5 mg TABS tablet Take 0.5 tablets (2.5 mg total) by mouth 2 (two) times daily before a meal.  60 tablet  1  . insulin aspart (NOVOLOG) 100 UNIT/ML  injection CBG 70 - 120: 0 units CBG 121 - 150: 1 unit CBG 151 - 200: 2 units CBG 201 - 250: 3 units CBG 251 - 300: 5 units CBG 301 - 350: 7 units CBG 351 - 400: 9 units  10 mL  2  . insulin aspart protamine- aspart (NOVOLOG MIX 70/30) (70-30) 100 UNIT/ML injection Inject 0.08 mLs (8 Units total) into the skin 2 (two) times daily with a meal.  30 mL  3  . naproxen (NAPROSYN) 500 MG tablet Take 500 mg by mouth as needed for mild pain.      Marland Kitchen neomycin-bacitracin-polymyxin (NEOSPORIN) ointment Apply 1 application topically daily as needed (for excema).      . Vitamin D, Ergocalciferol, (DRISDOL) 50000 UNITS CAPS capsule Take 1 capsule (50,000 Units total) by mouth every 7 (seven) days.  12 capsule  0  . diltiazem (CARDIZEM CD) 120 MG 24 hr capsule TAKE ONE CAPSULE BY MOUTH ONCE DAILY.  30 capsule  0   No current facility-administered medications for this visit.    Allergies Metoprolol; Other; Latex; and Pork-derived products  Family History: Family History  Problem Relation Age of Onset  . Heart disease Mother   . Diabetes Mother   . Sickle cell anemia Mother   . Cancer Father     colon    Social History: History   Social History  . Marital Status: Legally Separated    Spouse Name: N/A    Number of Children: N/A  . Years of Education: N/A   Occupational History  . Not on file.  Social History Main Topics  . Smoking status: Current Every Day Smoker -- 0.25 packs/day    Types: Cigarettes  . Smokeless tobacco: Never Used  . Alcohol Use: No  . Drug Use: No  . Sexual Activity: Yes   Other Topics Concern  . Not on file   Social History Narrative  . No narrative on file    Electrocardiogram:  SR arte 103  Normal   Assessment and Plan

## 2014-05-24 NOTE — Assessment & Plan Note (Signed)
Maint NSR Anticoagulation not feasible due to living in shelter and cost.  Ok to Rx ASA in NSR.  No beta blocker due to asthma  Refill for cardizem called in

## 2014-05-24 NOTE — Assessment & Plan Note (Signed)
Resolved 9/14  STress myovue normal Observe given risk factors ECG normal today

## 2014-05-24 NOTE — Assessment & Plan Note (Signed)
Well controlled.  Continue current medications and low sodium Dash type diet.    

## 2014-05-24 NOTE — Patient Instructions (Signed)
Your physician recommends that you schedule a follow-up appointment in: AS NEEDED  Your physician recommends that you continue on your current medications as directed. Please refer to the Current Medication list given to you today.  

## 2014-06-02 ENCOUNTER — Ambulatory Visit: Payer: Self-pay

## 2014-06-08 ENCOUNTER — Ambulatory Visit: Payer: Self-pay

## 2014-06-28 ENCOUNTER — Encounter: Payer: Self-pay | Admitting: Cardiovascular Disease

## 2014-09-03 ENCOUNTER — Emergency Department (HOSPITAL_COMMUNITY)
Admission: EM | Admit: 2014-09-03 | Discharge: 2014-09-03 | Discharge status: Against Medical Advice | Payer: Self-pay | Attending: Emergency Medicine | Admitting: Emergency Medicine

## 2014-09-03 ENCOUNTER — Encounter (HOSPITAL_COMMUNITY): Payer: Self-pay | Admitting: Emergency Medicine

## 2014-09-03 DIAGNOSIS — Z720 Tobacco use: Secondary | ICD-10-CM | POA: Insufficient documentation

## 2014-09-03 DIAGNOSIS — M1990 Unspecified osteoarthritis, unspecified site: Secondary | ICD-10-CM | POA: Insufficient documentation

## 2014-09-03 DIAGNOSIS — J45909 Unspecified asthma, uncomplicated: Secondary | ICD-10-CM | POA: Insufficient documentation

## 2014-09-03 DIAGNOSIS — E119 Type 2 diabetes mellitus without complications: Secondary | ICD-10-CM | POA: Insufficient documentation

## 2014-09-03 DIAGNOSIS — I10 Essential (primary) hypertension: Secondary | ICD-10-CM | POA: Insufficient documentation

## 2014-09-03 NOTE — ED Notes (Addendum)
Pt came to nurse first and states, "Why did they take back that lady that got here after me before me?"  I informed pt that patients are taken back according to acuity and what is wrong with them.  Attempted to explain triage process to pt and pt says, "You are full of sh*t" and walked back to her seat and put her coat on.  Pt started leaving.  I realized the pt she was referring to was gone to radiology and I attempted to explain to pt that the patient she was referring to was gone to radiology and pt states, "I am not talking to you" as she was walking out leaving.  At this time only this pt and 1 other patient are waiting to be seen.  Pt went outside and called back speaking with secretary Karena Addison and states that she did not get an x-ray because she was black and did not have insurance.  Thedore Mins, [place] notified of pt's concerns.

## 2014-09-03 NOTE — ED Notes (Signed)
Pt. 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

## 2014-12-22 ENCOUNTER — Other Ambulatory Visit: Payer: Self-pay | Admitting: Internal Medicine

## 2014-12-22 ENCOUNTER — Encounter (HOSPITAL_COMMUNITY): Payer: Self-pay | Admitting: Emergency Medicine

## 2014-12-22 ENCOUNTER — Emergency Department (HOSPITAL_COMMUNITY)
Admission: EM | Admit: 2014-12-22 | Discharge: 2014-12-22 | Disposition: A | Discharge status: Home / Self Care | Payer: Self-pay | Attending: Emergency Medicine | Admitting: Emergency Medicine

## 2014-12-22 DIAGNOSIS — Z91040 Latex allergy status: Secondary | ICD-10-CM | POA: Insufficient documentation

## 2014-12-22 DIAGNOSIS — B373 Candidiasis of vulva and vagina: Secondary | ICD-10-CM | POA: Insufficient documentation

## 2014-12-22 DIAGNOSIS — I10 Essential (primary) hypertension: Secondary | ICD-10-CM | POA: Insufficient documentation

## 2014-12-22 DIAGNOSIS — B3731 Acute candidiasis of vulva and vagina: Secondary | ICD-10-CM

## 2014-12-22 DIAGNOSIS — B9689 Other specified bacterial agents as the cause of diseases classified elsewhere: Secondary | ICD-10-CM

## 2014-12-22 DIAGNOSIS — Z79899 Other long term (current) drug therapy: Secondary | ICD-10-CM | POA: Insufficient documentation

## 2014-12-22 DIAGNOSIS — J45909 Unspecified asthma, uncomplicated: Secondary | ICD-10-CM | POA: Insufficient documentation

## 2014-12-22 DIAGNOSIS — M1990 Unspecified osteoarthritis, unspecified site: Secondary | ICD-10-CM | POA: Insufficient documentation

## 2014-12-22 DIAGNOSIS — Z794 Long term (current) use of insulin: Secondary | ICD-10-CM | POA: Insufficient documentation

## 2014-12-22 DIAGNOSIS — Z720 Tobacco use: Secondary | ICD-10-CM | POA: Insufficient documentation

## 2014-12-22 DIAGNOSIS — I4891 Unspecified atrial fibrillation: Secondary | ICD-10-CM | POA: Insufficient documentation

## 2014-12-22 DIAGNOSIS — E1165 Type 2 diabetes mellitus with hyperglycemia: Secondary | ICD-10-CM | POA: Insufficient documentation

## 2014-12-22 DIAGNOSIS — R739 Hyperglycemia, unspecified: Secondary | ICD-10-CM

## 2014-12-22 DIAGNOSIS — Z862 Personal history of diseases of the blood and blood-forming organs and certain disorders involving the immune mechanism: Secondary | ICD-10-CM | POA: Insufficient documentation

## 2014-12-22 DIAGNOSIS — Z3202 Encounter for pregnancy test, result negative: Secondary | ICD-10-CM | POA: Insufficient documentation

## 2014-12-22 DIAGNOSIS — N760 Acute vaginitis: Secondary | ICD-10-CM

## 2014-12-22 DIAGNOSIS — Z7982 Long term (current) use of aspirin: Secondary | ICD-10-CM | POA: Insufficient documentation

## 2014-12-22 LAB — CBC WITH DIFFERENTIAL/PLATELET
BASOS PCT: 0 % (ref 0–1)
Basophils Absolute: 0 10*3/uL (ref 0.0–0.1)
EOS ABS: 0.2 10*3/uL (ref 0.0–0.7)
Eosinophils Relative: 2 % (ref 0–5)
HCT: 40.6 % (ref 36.0–46.0)
HEMOGLOBIN: 14.6 g/dL (ref 12.0–15.0)
Lymphocytes Relative: 26 % (ref 12–46)
Lymphs Abs: 3.7 10*3/uL (ref 0.7–4.0)
MCH: 29.4 pg (ref 26.0–34.0)
MCHC: 36 g/dL (ref 30.0–36.0)
MCV: 81.9 fL (ref 78.0–100.0)
MONO ABS: 0.8 10*3/uL (ref 0.1–1.0)
Monocytes Relative: 5 % (ref 3–12)
NEUTROS PCT: 67 % (ref 43–77)
Neutro Abs: 9.4 10*3/uL — ABNORMAL HIGH (ref 1.7–7.7)
Platelets: 235 10*3/uL (ref 150–400)
RBC: 4.96 MIL/uL (ref 3.87–5.11)
RDW: 12.9 % (ref 11.5–15.5)
WBC: 14.1 10*3/uL — ABNORMAL HIGH (ref 4.0–10.5)

## 2014-12-22 LAB — WET PREP, GENITAL: Trich, Wet Prep: NONE SEEN

## 2014-12-22 LAB — URINALYSIS, ROUTINE W REFLEX MICROSCOPIC
BILIRUBIN URINE: NEGATIVE
HGB URINE DIPSTICK: NEGATIVE
Ketones, ur: NEGATIVE mg/dL
Leukocytes, UA: NEGATIVE
Nitrite: NEGATIVE
Protein, ur: NEGATIVE mg/dL
SPECIFIC GRAVITY, URINE: 1.026 (ref 1.005–1.030)
UROBILINOGEN UA: 0.2 mg/dL (ref 0.0–1.0)
pH: 5.5 (ref 5.0–8.0)

## 2014-12-22 LAB — COMPREHENSIVE METABOLIC PANEL
ALBUMIN: 3.4 g/dL — AB (ref 3.5–5.2)
ALK PHOS: 40 U/L (ref 39–117)
ALT: 13 U/L (ref 0–35)
ANION GAP: 11 (ref 5–15)
AST: 16 U/L (ref 0–37)
BUN: 8 mg/dL (ref 6–23)
CO2: 20 mmol/L (ref 19–32)
Calcium: 9 mg/dL (ref 8.4–10.5)
Chloride: 97 mmol/L (ref 96–112)
Creatinine, Ser: 0.75 mg/dL (ref 0.50–1.10)
GFR calc Af Amer: >90 mL/min (ref >90)
GFR calc non Af Amer: >90 mL/min (ref >90)
Glucose, Bld: 515 mg/dL — ABNORMAL HIGH (ref 70–99)
Potassium: 4.1 mmol/L (ref 3.5–5.1)
Sodium: 128 mmol/L — ABNORMAL LOW (ref 135–145)
TOTAL PROTEIN: 7.2 g/dL (ref 6.0–8.3)
Total Bilirubin: 0.4 mg/dL (ref 0.3–1.2)

## 2014-12-22 LAB — URINE MICROSCOPIC-ADD ON

## 2014-12-22 LAB — GC/CHLAMYDIA PROBE AMP (~~LOC~~) NOT AT ARMC
Chlamydia: NEGATIVE
NEISSERIA GONORRHEA: NEGATIVE

## 2014-12-22 LAB — POC URINE PREG, ED: PREG TEST UR: NEGATIVE

## 2014-12-22 LAB — CBG MONITORING, ED
Glucose-Capillary: 515 mg/dL — ABNORMAL HIGH (ref 70–99)
Glucose-Capillary: 419 mg/dL — ABNORMAL HIGH (ref 70–99)

## 2014-12-22 MED ORDER — SODIUM CHLORIDE 0.9 % IV BOLUS (SEPSIS)
1000.0000 mL | Freq: Once | INTRAVENOUS | Status: AC
  Administered 2014-12-22: 1000 mL via INTRAVENOUS

## 2014-12-22 MED ORDER — FLUCONAZOLE 150 MG PO TABS: 150.0000 mg | ORAL_TABLET | Freq: Once | ORAL | Status: DC

## 2014-12-22 MED ORDER — INSULIN ASPART PROT & ASPART (70-30 MIX) 100 UNIT/ML ~~LOC~~ SUSP
8.0000 [IU] | Freq: Once | SUBCUTANEOUS | Status: AC
  Administered 2014-12-22: 8 [IU] via SUBCUTANEOUS
  Filled 2014-12-22: qty 10

## 2014-12-22 MED ORDER — METRONIDAZOLE 500 MG PO TABS: 500.0000 mg | ORAL_TABLET | Freq: Two times a day (BID) | ORAL | Status: DC

## 2014-12-22 NOTE — ED Notes (Signed)
Pt c/o vaginal itching since yesterday, states she has had similar symptoms before that were diagnosed as trichomonas. Pt denies any urinary symptoms.

## 2014-12-22 NOTE — ED Notes (Signed)
CBG 515.

## 2014-12-22 NOTE — ED Notes (Signed)
Pt provided [place] sandwich and diet ginger ale. Pt is [facility].

## 2014-12-22 NOTE — Discharge Instructions (Signed)
Bacterial Vaginosis Bacterial vaginosis is a vaginal infection that occurs when the normal balance of bacteria in the vagina is disrupted. It results from an overgrowth of certain bacteria. This is the most common vaginal infection in women of childbearing age. Treatment is important to prevent complications, especially in pregnant women, as it can cause a premature delivery. CAUSES  Bacterial vaginosis is caused by an increase in harmful bacteria that are normally present in smaller amounts in the vagina. Several different kinds of bacteria can cause bacterial vaginosis. However, the reason that the condition develops is not fully understood. RISK FACTORS Certain activities or behaviors can put you at an increased risk of developing bacterial vaginosis, including:  Having a new sex partner or multiple sex partners.  Douching.  Using an intrauterine device (IUD) for contraception. Women do not get bacterial vaginosis from toilet seats, bedding, swimming pools, or contact with objects around them. SIGNS AND SYMPTOMS  Some women with bacterial vaginosis have no signs or symptoms. Common symptoms include:  Grey vaginal discharge.  A fishlike odor with discharge, especially after sexual intercourse.  Itching or burning of the vagina and vulva.  Burning or pain with urination. DIAGNOSIS  Your health care provider will take a medical history and examine the vagina for signs of bacterial vaginosis. A sample of vaginal fluid may be taken. Your health care provider will look at this sample under a microscope to check for bacteria and abnormal cells. A vaginal pH test may also be done.  TREATMENT  Bacterial vaginosis may be treated with antibiotic medicines. These may be given in the form of a pill or a vaginal cream. A second round of antibiotics may be prescribed if the condition comes back after treatment.  HOME CARE INSTRUCTIONS   Only take over-the-counter or prescription medicines as  directed by your health care provider.  If antibiotic medicine was prescribed, take it as directed. Make sure you finish it even if you start to feel better.  Do not have sex until treatment is completed.  Tell all sexual partners that you have a vaginal infection. They should see their health care provider and be treated if they have problems, such as a mild rash or itching.  Practice safe sex by using condoms and only having one sex partner. SEEK MEDICAL CARE IF:   Your symptoms are not improving after 3 days of treatment.  You have increased discharge or pain.  You have a fever. MAKE SURE YOU:   Understand these instructions.  Will watch your condition.  Will get help right away if you are not doing well or get worse. FOR MORE INFORMATION  Centers for Disease Control and Prevention, Division of STD Prevention: AppraiserFraud[employer]fi American Sexual Health Association (ASHA): www.ashastd.org  Document Released: 08/13/2005 Document Revised: 06/03/2013 Document Reviewed: 03/25/2013 [facility] Patient Information 2015 [place], [place]. This information is not intended to replace advice given to you by your health care provider. Make sure you discuss any questions you have with your health care provider. Candidal Vulvovaginitis Candidal vulvovaginitis is an infection of the vagina and vulva. The vulva is the skin around the opening of the vagina. This may cause itching and discomfort in and around the vagina.  HOME CARE  Only take medicine as told by your doctor.  Do not have sex (intercourse) until the infection is healed or as told by your doctor.  Practice safe sex.  Tell your sex partner about your infection.  Do not douche or use tampons.  Wear cotton  underwear. Do not wear tight pants or panty hose.  Eat yogurt. This may help treat and prevent yeast infections. GET HELP RIGHT AWAY IF:   You have a fever.  Your problems get worse during treatment or do not get better in 3  days.  You have discomfort, irritation, or itching in your vagina or vulva area.  You have pain after sex.  You start to get belly (abdominal) pain. MAKE SURE YOU:  Understand these instructions.  Will watch your condition.  Will get help right away if you are not doing well or get worse. Document Released: 11/09/2008 Document Revised: 08/18/2013 Document Reviewed: 11/09/2008 [facility] Patient Information 2015 [place], [place]. This information is not intended to replace advice given to you by your health care provider. Make sure you discuss any questions you have with your health care provider.  Antibiotic Medication Antibiotic medicine helps fight germs. Germs cause infections. This type of medicine will not work for colds, flu, or other viral infections. Tell your doctor if you:  Are allergic to any medicines.  Are pregnant or are trying to get pregnant.  Are taking other medicines.  Have other medical problems. HOME CARE  Take your medicine with a glass of water or food as told by your doctor.  Take the medicine as told. Finish them even if you start to feel better.  Do not give your medicine to other people.  Do not use your medicine in the future for a different infection.  Ask your doctor about which side effects to watch for.  Try not to miss any doses. If you miss a dose, take it as soon as possible. If it is almost time for your next dose, and your dosing schedule is:  Two doses a day, take the missed dose and the next dose 5 to 6 hours later.  Three or more doses a day, take the missed dose and the next dose 2 to 4 hours later, or double your next dose.  Then go back to your normal schedule. GET HELP RIGHT AWAY IF:   You get worse or do not get better within a few days.  The medicine makes you sick.  You develop a rash or any other side effects.  You have questions or concerns. MAKE SURE YOU:  Understand these instructions.  Will watch your  condition.  Will get help right away if you are not doing well or get worse. Document Released: 05/22/2008 Document Revised: 11/05/2011 Document Reviewed: 07/19/2009 [facility] Patient Information 2015 [place], [place]. This information is not intended to replace advice given to you by your health care provider. Make sure you discuss any questions you have with your health care provider. Hyperglycemia Hyperglycemia occurs when the glucose (sugar) in your blood is too high. Hyperglycemia can happen for many reasons, but it most often happens to people who do not know they have diabetes or are not managing their diabetes properly.  CAUSES  Whether you have diabetes or not, there are other causes of hyperglycemia. Hyperglycemia can occur when you have diabetes, but it can also occur in other situations that you might not be as aware of, such as: Diabetes  If you have diabetes and are having problems controlling your blood glucose, hyperglycemia could occur because of some of the following reasons:  Not following your meal plan.  Not taking your diabetes medications or not taking it properly.  Exercising less or doing less activity than you normally do.  Being sick. Pre-diabetes  This  cannot be ignored. Before people develop Type 2 diabetes, they almost always have "pre-diabetes." This is when your blood glucose levels are higher than normal, but not yet high enough to be diagnosed as diabetes. Research has shown that some long-term damage to the body, especially the heart and circulatory system, may already be occurring during pre-diabetes. If you take action to manage your blood glucose when you have pre-diabetes, you may delay or prevent Type 2 diabetes from developing. Stress  If you have diabetes, you may be "diet" controlled or on oral medications or insulin to control your diabetes. However, you may find that your blood glucose is higher than usual in the hospital whether you have diabetes  or not. This is often referred to as "stress hyperglycemia." Stress can elevate your blood glucose. This happens because of hormones put out by the body during times of stress. If stress has been the cause of your high blood glucose, it can be followed regularly by your caregiver. That way he/she can make sure your hyperglycemia does not continue to get worse or progress to diabetes. Steroids  Steroids are medications that act on the infection fighting system (immune system) to block inflammation or infection. One side effect can be a rise in blood glucose. Most people can produce enough extra insulin to allow for this rise, but for those who cannot, steroids make blood glucose levels go even higher. It is not unusual for steroid treatments to "uncover" diabetes that is developing. It is not always possible to determine if the hyperglycemia will go away after the steroids are stopped. A special blood test called an A1c is sometimes done to determine if your blood glucose was elevated before the steroids were started. SYMPTOMS  Thirsty.  Frequent urination.  Dry mouth.  Blurred vision.  Tired or fatigue.  Weakness.  Sleepy.  Tingling in feet or leg. DIAGNOSIS  Diagnosis is made by monitoring blood glucose in one or all of the following ways:  A1c test. This is a chemical found in your blood.  Fingerstick blood glucose monitoring.  Laboratory results. TREATMENT  First, knowing the cause of the hyperglycemia is important before the hyperglycemia can be treated. Treatment may include, but is not be limited to:  Education.  Change or adjustment in medications.  Change or adjustment in meal plan.  Treatment for an illness, infection, etc.  More frequent blood glucose monitoring.  Change in exercise plan.  Decreasing or stopping steroids.  Lifestyle changes. HOME CARE INSTRUCTIONS   Test your blood glucose as directed.  Exercise regularly. Your caregiver will give you  instructions about exercise. Pre-diabetes or diabetes which comes on with stress is helped by exercising.  Eat wholesome, balanced meals. Eat often and at regular, fixed times. Your caregiver or nutritionist will give you a meal plan to guide your sugar intake.  Being at an ideal weight is important. If needed, losing as little as 10 to 15 pounds may help improve blood glucose levels. SEEK MEDICAL CARE IF:   You have questions about medicine, activity, or diet.  You continue to have symptoms (problems such as increased thirst, urination, or weight gain). SEEK IMMEDIATE MEDICAL CARE IF:   You are vomiting or have diarrhea.  Your breath smells fruity.  You are breathing faster or slower.  You are very sleepy or incoherent.  You have numbness, tingling, or pain in your feet or hands.  You have chest pain.  Your symptoms get worse even though you have been following  your caregiver's orders.  If you have any other questions or concerns. Document Released: 02/06/2001 Document Revised: 11/05/2011 Document Reviewed: 12/10/2011 [facility] Patient Information 2015 [place], [place]. This information is not intended to replace advice given to you by your health care provider. Make sure you discuss any questions you have with your health care provider.

## 2014-12-22 NOTE — ED Provider Notes (Signed)
CSN: [id number]     Arrival date & time 12/22/14  2620 History   First MD Initiated Contact with Patient 12/22/14 [phone]     Chief Complaint  Patient presents with  . Vaginal Itching   Anna Golden is a 54 y.o. female with a history of hypertension and DM who presents to the ED complaining of vaginal itching since yesterday. Patient reports her last visit was 11/14/2014 and she reports regular menses. Patient reports being sexually active with one partner and does not use protection. The patient denies vaginal bleeding or vaginal discharge. The patient denies urinary symptoms, abdominal pain, nausea, vomiting, diarrhea, chest pain, shortness of breath, palpitations or rashes. Initially the patient tells me she has been taking her DM medications, later she admits she has ran out of her insulin.   (Consider location/radiation/quality/duration/timing/severity/associated sxs/prior Treatment) HPI  Past Medical History  Diagnosis Date  . Diabetes mellitus   . Hypertension   . Arthritis   . Hypokalemia   . Asthma   . Sickle cell trait   . Atrial fibrillation     a. Dx 03/2013.   Past Surgical History  Procedure Laterality Date  . Hernia repair     Family History  Problem Relation Age of Onset  . Heart disease Mother   . Diabetes Mother   . Sickle cell anemia Mother   . Cancer Father     colon   History  Substance Use Topics  . Smoking status: Current Every Day Smoker -- 0.25 packs/day    Types: Cigarettes  . Smokeless tobacco: Never Used  . Alcohol Use: No   OB History    Gravida Para Term Preterm AB TAB SAB Ectopic Multiple Living   6 4 1 3 2  2   3      Review of Systems  Constitutional: Negative for fever.  Respiratory: Negative for cough and shortness of breath.   Cardiovascular: Negative for chest pain and palpitations.  Gastrointestinal: Negative for nausea, vomiting, abdominal pain and diarrhea.  Genitourinary: Negative for dysuria, urgency, frequency, hematuria,  vaginal bleeding, vaginal discharge, difficulty urinating and genital sores.       Vaginal itching  Skin: Negative for rash.      Allergies  Metoprolol; Other; Latex; and Pork-derived products  Home Medications   Prior to Admission medications   Medication Sig Start Date End Date Taking? Authorizing Provider  aspirin EC 81 MG tablet Take 81 mg by mouth daily.   Yes Historical Provider, MD  diltiazem (CARDIZEM CD) 120 MG 24 hr capsule Take 1 capsule (120 mg total) by mouth daily. 05/24/14  Yes Josue Hector, MD  glipiZIDE (GLUCOTROL) 2.5 mg TABS tablet Take 0.5 tablets (2.5 mg total) by mouth 2 (two) times daily before a meal. 05/24/14  Yes Deepak Advani, MD  insulin aspart (NOVOLOG) 100 UNIT/ML injection CBG 70 - 120: 0 units CBG 121 - 150: 1 unit CBG 151 - 200: 2 units CBG 201 - 250: 3 units CBG 251 - 300: 5 units CBG 301 - 350: 7 units CBG 351 - 400: 9 units 03/09/14  Yes Hosie Poisson, MD  insulin aspart protamine- aspart (NOVOLOG MIX 70/30) (70-30) 100 UNIT/ML injection Inject 0.08 mLs (8 Units total) into the skin 2 (two) times daily with a meal. 04/26/14  Yes Deepak Advani, MD  fluconazole (DIFLUCAN) 150 MG tablet Take 1 tablet (150 mg total) by mouth once. 12/22/14   Waynetta Pean, PA-C  metroNIDAZOLE (FLAGYL) 500 MG tablet Take 1 tablet (  500 mg total) by mouth 2 (two) times daily. 12/22/14   Waynetta Pean, PA-C  Vitamin D, Ergocalciferol, (DRISDOL) 50000 UNITS CAPS capsule Take 1 capsule (50,000 Units total) by mouth every 7 (seven) days. Patient not taking: Reported on 12/22/2014 04/12/14   Tresa Garter, MD   BP 105/69 mmHg  Pulse 99  Temp(Src) 98.2 F (36.8 C) (Oral)  Resp 14  Ht 5\' 2"  (1.575 m)  Wt 189 lb (85.73 kg)  BMI 34.56 kg/m2  SpO2 100%  LMP 11/22/2014 (Approximate) Physical Exam  Constitutional: She appears well-developed and well-nourished. No distress.  Nontoxic appearing.  HENT:  Head: Normocephalic and atraumatic.  Mouth/Throat: No oropharyngeal  exudate.  Eyes: Conjunctivae are normal. Pupils are equal, round, and reactive to light. Right eye exhibits no discharge. Left eye exhibits no discharge.  Neck: Normal range of motion. Neck supple. No JVD present.  Cardiovascular: Normal rate, regular rhythm, normal heart sounds and intact distal pulses.   HR 92  Pulmonary/Chest: Effort normal and breath sounds normal. No respiratory distress. She has no wheezes. She has no rales.  Abdominal: Soft. Bowel sounds are normal. She exhibits no distension and no mass. There is no tenderness. There is no rebound and no guarding.  Abdomen is soft and nontender to palpation. Bowel sounds are present.  Genitourinary:  Pelvic exam performed by me with female nurse tech chaperone. Patient has a moderate amount of white vaginal discharge. No external lesions or lacerations. Cervix is closed. Bleeding. No adnexal tenderness or fullness. No uterine tenderness.  Lymphadenopathy:    She has no cervical adenopathy.  Neurological: She is alert. Coordination normal.  Skin: Skin is warm and dry. No rash noted. She is not diaphoretic. No erythema. No pallor.  Psychiatric: She has a normal mood and affect. Her behavior is normal.  Nursing note and vitals reviewed.   ED Course  Procedures (including critical care time) Labs Review Labs Reviewed  WET PREP, GENITAL - Abnormal; Notable for the following:    Yeast Wet Prep HPF POC FEW (*)    Clue Cells Wet Prep HPF POC MODERATE (*)    WBC, Wet Prep HPF POC MODERATE (*)    All other components within normal limits  URINALYSIS, ROUTINE W REFLEX MICROSCOPIC - Abnormal; Notable for the following:    Color, Urine STRAW (*)    APPearance HAZY (*)    Glucose, UA >1000 (*)    All other components within normal limits  URINE MICROSCOPIC-ADD ON - Abnormal; Notable for the following:    Squamous Epithelial / LPF FEW (*)    All other components within normal limits  CBC WITH DIFFERENTIAL/PLATELET - Abnormal; Notable for  the following:    WBC 14.1 (*)    Neutro Abs 9.4 (*)    All other components within normal limits  COMPREHENSIVE METABOLIC PANEL - Abnormal; Notable for the following:    Sodium 128 (*)    Glucose, Bld 515 (*)    Albumin 3.4 (*)    All other components within normal limits  CBG MONITORING, ED - Abnormal; Notable for the following:    Glucose-Capillary 515 (*)    All other components within normal limits  CBG MONITORING, ED - Abnormal; Notable for the following:    Glucose-Capillary 419 (*)    All other components within normal limits  POC URINE PREG, ED  GC/CHLAMYDIA PROBE AMP ([place])    Imaging Review No results found.   EKG Interpretation None  Filed Vitals:   12/22/14 0830 12/22/14 0845 12/22/14 0915 12/22/14 0930  BP: 129/78 124/83 124/85 105/69  Pulse: 92 87 95 99  Temp:      TempSrc:      Resp:      Height:      Weight:      SpO2: 96% 97% 97% 100%     MDM   Meds given in ED:  Medications  sodium chloride 0.9 % bolus 1,000 mL (0 mLs Intravenous Stopped 12/22/14 0855)  insulin aspart protamine- aspart (NOVOLOG MIX 70/30) injection 8 Units (8 Units Subcutaneous Given 12/22/14 0949)    Discharge Medication List as of 12/22/2014  9:18 AM    START taking these medications   Details  fluconazole (DIFLUCAN) 150 MG tablet Take 1 tablet (150 mg total) by mouth once., Starting 12/22/2014, Print    metroNIDAZOLE (FLAGYL) 500 MG tablet Take 1 tablet (500 mg total) by mouth 2 (two) times daily., Starting 12/22/2014, Until Discontinued, Print        Final diagnoses:  Bacterial vaginosis  Hyperglycemia  Vaginal candidiasis   This is a 54 y.o. female with a history of hypertension and DM who presents to the ED complaining of vaginal itching since yesterday. She denies vaginal bleeding or vaginal discharge. On exam the patient is afebrile and nontoxic appearing. Her abdomen is soft and nontender to palpation. She has a small amount of vaginal discharge on  pelvic exam. No adnexal tenderness or fullness. Urinalysis returned with greater than thousand glucose. Initially the patient reported she had been taking her diabetes medications however after I confronted her she reports she is out of her insulin. CBG was checked and revealed to be 515. Patient given fluid bolus. Her CMP indicated a glucose of 515 and a normal anion gap. Her repeat sugar after fluid boluses 419. We'll provide the patient with her morning dose of NovoLog 70/30. Patient is followed by the wellness center and she reports she is able to go there to get her refills on her diabetic medication. I advised her to go straight there this morning to get her refills on her insulin. She understands and agrees with this plan. Patient's wet prep returned with moderate clue cells and few disease. Will treat for BV and actions with Flagyl and Diflucan. I advised the patient to follow-up with their primary care provider today at the wellness center to get refills on her insulin. I advised the patient to return to the emergency department with new or worsening symptoms or new concerns. The patient verbalized understanding and agreement with plan.   This patient was discussed with Dr. Johnney Killian who agrees with assessment and plan.      Waynetta Pean, PA-C 12/22/14 1342  Charlesetta Shanks, MD 12/25/14 [phone]

## 2014-12-22 NOTE — ED Notes (Signed)
CBG 419 

## 2014-12-22 NOTE — ED Notes (Signed)
Pharmacy to send dose of insulin. Per PA, pt can leave after receiving insulin. Pt then going to wellness center to refill meds.

## 2015-01-14 ENCOUNTER — Encounter (HOSPITAL_COMMUNITY): Payer: Self-pay | Admitting: Emergency Medicine

## 2015-01-14 ENCOUNTER — Emergency Department (HOSPITAL_COMMUNITY)
Admission: EM | Admit: 2015-01-14 | Discharge: 2015-01-15 | Disposition: A | Discharge status: Home / Self Care | Payer: Self-pay | Attending: Emergency Medicine | Admitting: Emergency Medicine

## 2015-01-14 ENCOUNTER — Emergency Department (HOSPITAL_COMMUNITY): Payer: Self-pay

## 2015-01-14 DIAGNOSIS — Z7982 Long term (current) use of aspirin: Secondary | ICD-10-CM | POA: Insufficient documentation

## 2015-01-14 DIAGNOSIS — Z79899 Other long term (current) drug therapy: Secondary | ICD-10-CM | POA: Insufficient documentation

## 2015-01-14 DIAGNOSIS — Z794 Long term (current) use of insulin: Secondary | ICD-10-CM | POA: Insufficient documentation

## 2015-01-14 DIAGNOSIS — E119 Type 2 diabetes mellitus without complications: Secondary | ICD-10-CM | POA: Insufficient documentation

## 2015-01-14 DIAGNOSIS — I10 Essential (primary) hypertension: Secondary | ICD-10-CM | POA: Insufficient documentation

## 2015-01-14 DIAGNOSIS — Z91040 Latex allergy status: Secondary | ICD-10-CM | POA: Insufficient documentation

## 2015-01-14 DIAGNOSIS — Z862 Personal history of diseases of the blood and blood-forming organs and certain disorders involving the immune mechanism: Secondary | ICD-10-CM | POA: Insufficient documentation

## 2015-01-14 DIAGNOSIS — Z720 Tobacco use: Secondary | ICD-10-CM | POA: Insufficient documentation

## 2015-01-14 DIAGNOSIS — I4891 Unspecified atrial fibrillation: Secondary | ICD-10-CM | POA: Insufficient documentation

## 2015-01-14 DIAGNOSIS — R079 Chest pain, unspecified: Secondary | ICD-10-CM | POA: Insufficient documentation

## 2015-01-14 DIAGNOSIS — R05 Cough: Secondary | ICD-10-CM | POA: Insufficient documentation

## 2015-01-14 DIAGNOSIS — J45909 Unspecified asthma, uncomplicated: Secondary | ICD-10-CM | POA: Insufficient documentation

## 2015-01-14 DIAGNOSIS — M1990 Unspecified osteoarthritis, unspecified site: Secondary | ICD-10-CM | POA: Insufficient documentation

## 2015-01-14 LAB — BASIC METABOLIC PANEL
Anion gap: 13 (ref 5–15)
BUN: <5 mg/dL — ABNORMAL LOW (ref 6–20)
CALCIUM: 8.6 mg/dL — AB (ref 8.9–10.3)
CO2: 23 mmol/L (ref 22–32)
Chloride: 103 mmol/L (ref 101–111)
Creatinine, Ser: 0.68 mg/dL (ref 0.44–1.00)
GFR calc Af Amer: >60 mL/min (ref >60)
GLUCOSE: 266 mg/dL — AB (ref 65–99)
Potassium: 3.2 mmol/L — ABNORMAL LOW (ref 3.5–5.1)
Sodium: 139 mmol/L (ref 135–145)

## 2015-01-14 LAB — CBC
HCT: 37.1 % (ref 36.0–46.0)
Hemoglobin: 12.8 g/dL (ref 12.0–15.0)
MCH: 29.6 pg (ref 26.0–34.0)
MCHC: 34.5 g/dL (ref 30.0–36.0)
MCV: 85.9 fL (ref 78.0–100.0)
PLATELETS: 253 10*3/uL (ref 150–400)
RBC: 4.32 MIL/uL (ref 3.87–5.11)
RDW: 13.5 % (ref 11.5–15.5)
WBC: 13.7 10*3/uL — ABNORMAL HIGH (ref 4.0–10.5)

## 2015-01-14 LAB — BRAIN NATRIURETIC PEPTIDE: B Natriuretic Peptide: 16.8 pg/mL (ref 0.0–100.0)

## 2015-01-14 LAB — I-STAT TROPONIN, ED: TROPONIN I, POC: 0.01 ng/mL (ref 0.00–0.08)

## 2015-01-14 NOTE — ED Notes (Signed)
Pt. reports central and left chest pain with mild SOB onset today , denies emesis or diaphoresis .

## 2015-01-14 NOTE — ED Provider Notes (Signed)
CSN: [id number]     Arrival date & time 01/14/15  2219 History  This chart was scribed for Merryl Hacker, MD by Peyton Bottoms, ED Scribe. This patient was seen in room B16C/B16C and the patient's care was started at 11:55 PM.   Chief Complaint  Patient presents with  . Chest Pain   Patient is a 54 y.o. female presenting with chest pain. The history is provided by the patient. No language interpreter was used.  Chest Pain Associated symptoms: cough   Associated symptoms: no abdominal pain, no back pain, no fever, no nausea, no shortness of breath and not vomiting    HPI Comments: Anna Golden is a 54 y.o. female with a PMHx of diabetes, hypertension, atrial fibrillation and sickle cell trait, who presents to the Emergency Department complaining of constant, moderate left sided chest pain onset earlier today this morning that waxes and wanes. Patient describes the pain to be a sharp pain that radiates to her back. Also with left arm numbness.  Pain is exacerbated by ambulation. She reports associated non productive cough and lightheadedness. Patient has taken 81mg  of Aspirin earlier today. She denies current chest pain. She states she last had chest pain "not too long ago". Patient is a smoker. Patient is seen by cardiologist at [facility].   Past Medical History  Diagnosis Date  . Diabetes mellitus   . Hypertension   . Arthritis   . Hypokalemia   . Asthma   . Sickle cell trait   . Atrial fibrillation     a. Dx 03/2013.   Past Surgical History  Procedure Laterality Date  . Hernia repair     Family History  Problem Relation Age of Onset  . Heart disease Mother   . Diabetes Mother   . Sickle cell anemia Mother   . Cancer Father     colon   History  Substance Use Topics  . Smoking status: Current Every Day Smoker -- 0.25 packs/day    Types: Cigarettes  . Smokeless tobacco: Never Used  . Alcohol Use: No   OB History    Gravida Para Term Preterm AB TAB SAB Ectopic Multiple  Living   6 4 1 3 2  2   3      Review of Systems  Constitutional: Negative for fever.  Respiratory: Positive for cough. Negative for chest tightness and shortness of breath.   Cardiovascular: Positive for chest pain.  Gastrointestinal: Negative for nausea, vomiting and abdominal pain.  Genitourinary: Negative for dysuria.  Musculoskeletal: Negative for back pain.  Psychiatric/Behavioral: Negative for confusion.  All other systems reviewed and are negative.  Allergies  Metoprolol; Other; Latex; and Pork-derived products  Home Medications   Prior to Admission medications   Medication Sig Start Date End Date Taking? Authorizing Provider  aspirin EC 81 MG tablet Take 81 mg by mouth daily.   Yes Historical Provider, MD  diltiazem (CARDIZEM CD) 120 MG 24 hr capsule Take 1 capsule (120 mg total) by mouth daily. 05/24/14  Yes Josue Hector, MD  glipiZIDE (GLUCOTROL) 5 MG tablet TAKE 1/2 TABLET TWICE DAILY 12/24/14  Yes Lorayne Marek, MD  insulin NPH-regular Human (NOVOLIN 70/30) (70-30) 100 UNIT/ML injection Inject 0-9 Units into the skin 2 (two) times daily with a meal. Sliding scale   Yes Historical Provider, MD  fluconazole (DIFLUCAN) 150 MG tablet Take 1 tablet (150 mg total) by mouth once. Patient not taking: Reported on 01/15/2015 12/22/14   Waynetta Pean, PA-C  insulin  aspart (NOVOLOG) 100 UNIT/ML injection CBG 70 - 120: 0 units CBG 121 - 150: 1 unit CBG 151 - 200: 2 units CBG 201 - 250: 3 units CBG 251 - 300: 5 units CBG 301 - 350: 7 units CBG 351 - 400: 9 units Patient not taking: Reported on 01/15/2015 03/09/14   Hosie Poisson, MD  insulin aspart protamine- aspart (NOVOLOG MIX 70/30) (70-30) 100 UNIT/ML injection Inject 0.08 mLs (8 Units total) into the skin 2 (two) times daily with a meal. Patient not taking: Reported on 01/15/2015 04/26/14   Lorayne Marek, MD  metroNIDAZOLE (FLAGYL) 500 MG tablet Take 1 tablet (500 mg total) by mouth 2 (two) times daily. Patient not taking: Reported  on 01/15/2015 12/22/14   Waynetta Pean, PA-C  NOVOLIN 70/30 (70-30) 100 UNIT/ML injection INJECT 8 UNITS INTO THE SKIN 2 TIMES DAILY WITH A MEAL Patient not taking: Reported on 01/15/2015 12/24/14   Lorayne Marek, MD  Vitamin D, Ergocalciferol, (DRISDOL) 50000 UNITS CAPS capsule Take 1 capsule (50,000 Units total) by mouth every 7 (seven) days. Patient not taking: Reported on 12/22/2014 04/12/14   Tresa Garter, MD   Triage Vitals: BP 117/67 mmHg  Pulse 82  Temp(Src) 98.5 F (36.9 C) (Oral)  Resp 16  SpO2 99%  LMP 12/20/2014  Physical Exam  Constitutional: She is oriented to person, place, and time. She appears well-developed and well-nourished.  overweight  HENT:  Head: Normocephalic and atraumatic.  Cardiovascular: Normal rate, regular rhythm and normal heart sounds.   Pulmonary/Chest: Effort normal and breath sounds normal. No respiratory distress. She has no wheezes. She exhibits tenderness.  Abdominal: Soft. Bowel sounds are normal. There is no tenderness. There is no rebound.  Musculoskeletal:  Trace BLE edema  Neurological: She is alert and oriented to person, place, and time.  Skin: Skin is warm and dry.  Psychiatric: She has a normal mood and affect.  Nursing note and vitals reviewed.   ED Course  Procedures (including critical care time)  DIAGNOSTIC STUDIES: Oxygen Saturation is 99% on RA, normal by my interpretation.    COORDINATION OF CARE: 11:58 PM- Discussed plans to order diagnostic CXR and lab test. Advised patient for admission tonight to conduct further lab work. Pt advised of plan for treatment and pt agrees.  Labs Review Labs Reviewed  CBC - Abnormal; Notable for the following:    WBC 13.7 (*)    All other components within normal limits  BASIC METABOLIC PANEL - Abnormal; Notable for the following:    Potassium 3.2 (*)    Glucose, Bld 266 (*)    BUN <5 (*)    Calcium 8.6 (*)    All other components within normal limits  BRAIN NATRIURETIC PEPTIDE   I-STAT TROPOININ, ED  Randolm Idol, ED   Imaging Review Dg Chest 2 View  01/14/2015   CLINICAL DATA:  Patient with pounding sensation in her chest. Left-sided weakness.  EXAM: CHEST  2 VIEW  COMPARISON:  Chest radiograph 03/06/2014  FINDINGS: Stable cardiac and mediastinal contours. No consolidative pulmonary opacities. No pleural effusion or pneumothorax. Mid thoracic spine degenerative changes.  IMPRESSION: No acute cardiopulmonary process.   Electronically Signed   By: Lovey Newcomer M.D.   On: 01/14/2015 23:08    EKG Interpretation   Date/Time:  Friday Jan 14 2015 22:25:43 EDT Ventricular Rate:  88 PR Interval:  184 QRS Duration: 74 QT Interval:  390 QTC Calculation: 471 R Axis:   53 Text Interpretation:  Normal sinus rhythm No  significant change since last  tracing Confirmed by HORTON  MD, COURTNEY ([id number]) on 01/14/2015 11:55:03 PM     MDM   Final diagnoses:  Chest pain, unspecified chest pain type    Patient presents with chest pain for last day. Currently chest pain-free. Risk factors for heart disease include hypertension, diabetes, and smoking. Followed by cardiology for paroxysmal atrial fibrillation. Some of the story is concerning for ACS including the excertional component. However, patient also has some reproducible chest pain on exam.  Patient had a normal stress test in 2014. Patient given full dose aspirin. Workup is larger reassuring including EKG, chest x-ray, troponin. Discussed with Dr. Levin Erp, cardiology fellow on call. He has assessed the patient. Feels chest pain is reproducible and atypical. Even feels that with her risk factor she can follow-up closely with cardiology. Will repeat troponin at 3 hours and if negative she can follow-up with Dr. Johnsie Cancel.  Patient is agreeable to plan.  After history, exam, and medical workup I feel the patient has been appropriately medically screened and is safe for discharge home. Pertinent diagnoses were discussed with the  patient. Patient was given return precautions.  I personally performed the services described in this documentation, which was scribed in my presence. The recorded information has been reviewed and is accurate.   Merryl Hacker, MD 01/15/15 [phone]

## 2015-01-15 LAB — I-STAT TROPONIN, ED: TROPONIN I, POC: 0 ng/mL (ref 0.00–0.08)

## 2015-01-15 MED ORDER — ASPIRIN 81 MG PO CHEW
324.0000 mg | CHEWABLE_TABLET | Freq: Once | ORAL | Status: AC
  Administered 2015-01-15: 243 mg via ORAL
  Filled 2015-01-15: qty 4

## 2015-01-15 NOTE — Discharge Instructions (Signed)
You were seen today for chest pain. Your heart tests are reassuring. There is tenderness over her chest wall which does suggest some muscular component of your pain. Cardiologist on call felt that you were a candidate for repeat heart tests in the ER and discharged home with close cardiology follow-up.  Chest Pain (Nonspecific) It is often hard to give a specific diagnosis for the cause of chest pain. There is always a chance that your pain could be related to something serious, such as a heart attack or a blood clot in the lungs. You need to follow up with your health care provider for further evaluation. CAUSES   Heartburn.  Pneumonia or bronchitis.  Anxiety or stress.  Inflammation around your heart (pericarditis) or lung (pleuritis or pleurisy).  A blood clot in the lung.  A collapsed lung (pneumothorax). It can develop suddenly on its own (spontaneous pneumothorax) or from trauma to the chest.  Shingles infection (herpes zoster virus). The chest wall is composed of bones, muscles, and cartilage. Any of these can be the source of the pain.  The bones can be bruised by injury.  The muscles or cartilage can be strained by coughing or overwork.  The cartilage can be affected by inflammation and become sore (costochondritis). DIAGNOSIS  Lab tests or other studies may be needed to find the cause of your pain. Your health care provider may have you take a test called an ambulatory electrocardiogram (ECG). An ECG records your heartbeat patterns over a 24-hour period. You may also have other tests, such as:  Transthoracic echocardiogram (TTE). During echocardiography, sound waves are used to evaluate how blood flows through your heart.  Transesophageal echocardiogram (TEE).  Cardiac monitoring. This allows your health care provider to monitor your heart rate and rhythm in real time.  Holter monitor. This is a portable device that records your heartbeat and can help diagnose heart  arrhythmias. It allows your health care provider to track your heart activity for several days, if needed.  Stress tests by exercise or by giving medicine that makes the heart beat faster. TREATMENT   Treatment depends on what may be causing your chest pain. Treatment may include:  Acid blockers for heartburn.  Anti-inflammatory medicine.  Pain medicine for inflammatory conditions.  Antibiotics if an infection is present.  You may be advised to change lifestyle habits. This includes stopping smoking and avoiding alcohol, caffeine, and chocolate.  You may be advised to keep your head raised (elevated) when sleeping. This reduces the chance of acid going backward from your stomach into your esophagus. Most of the time, nonspecific chest pain will improve within 2-3 days with rest and mild pain medicine.  HOME CARE INSTRUCTIONS   If antibiotics were prescribed, take them as directed. Finish them even if you start to feel better.  For the next few days, avoid physical activities that bring on chest pain. Continue physical activities as directed.  Do not use any tobacco products, including cigarettes, chewing tobacco, or electronic cigarettes.  Avoid drinking alcohol.  Only take medicine as directed by your health care provider.  Follow your health care provider's suggestions for further testing if your chest pain does not go away.  Keep any follow-up appointments you made. If you do not go to an appointment, you could develop lasting (chronic) problems with pain. If there is any problem keeping an appointment, call to reschedule. SEEK MEDICAL CARE IF:   Your chest pain does not go away, even after treatment.  You  have a rash with blisters on your chest.  You have a fever. SEEK IMMEDIATE MEDICAL CARE IF:   You have increased chest pain or pain that spreads to your arm, neck, jaw, back, or abdomen.  You have shortness of breath.  You have an increasing cough, or you cough up  blood.  You have severe back or abdominal pain.  You feel nauseous or vomit.  You have severe weakness.  You faint.  You have chills. This is an emergency. Do not wait to see if the pain will go away. Get medical help at once. Call your local emergency services (911 in [place] Do not drive yourself to the hospital. MAKE SURE YOU:   Understand these instructions.  Will watch your condition.  Will get help right away if you are not doing well or get worse. Document Released: 05/23/2005 Document Revised: 08/18/2013 Document Reviewed: 03/18/2008 [facility] Patient Information 2015 [place], [place]. This information is not intended to replace advice given to you by your health care provider. Make sure you discuss any questions you have with your health care provider.

## 2015-01-15 NOTE — Consult Note (Signed)
Referring Physician:  Dr. Dina Rich Primary Physician: Primary Cardiologist: Dr. Johnsie Cancel Reason for Consultation:  Chest pain   HPI: Anna Golden is a 54 yrs old african american woman with a pmh of HTN, IDDM, HLP , h/o PAF not on anticoagulation last stress test in 2014 that was negative , sickle cell trait came to ED with c/o chest pain that started yesterday morning 5 am . She tells me that chest pain is constant radiates to her back and worse with deep breathing and cough. It is also worse with ambulating. Pain is constant with intermittent exacerbation never went away completely. She is having non productive cough and chills for the last 2 days and thinks she is coming down with flu Takes her meds regulalry  Smoker + Review of Systems:     Cardiac Review of Systems: {Y] = yes [ ]  = no  Chest Pain [ y   ]  Resting SOB [   ] Exertional SOB  [  ]  Orthopnea [  ]   Pedal Edema [   ]    Palpitations [  ] Syncope  [  ]   Presyncope [   ]  General Review of Systems: [Y] = yes [  ]=no Constitional: recent weight change [  ]; anorexia [  ]; fatigue [  ]; nausea [  ]; night sweats [  ]; fever [  ]; or chills Blue.Reese  ];                                                                     Eyes : blurred vision [  ]; diplopia [   ]; vision changes [  ];  Amaurosis fugax[  ]; Resp: cough [  y];  wheezing[  ];  hemoptysis[  ];  PND [  ];  GI:  gallstones[  ], vomiting[  ];  dysphagia[  ]; melena[  ];  hematochezia [  ]; heartburn[  ];   GU: kidney stones [  ]; hematuria[  ];   dysuria [  ];  nocturia[  ]; incontinence [  ];             Skin: rash, swelling[  ];, hair loss[  ];  peripheral edema[  ];  or itching[  ]; Musculosketetal: myalgias[y  ];  joint swelling[  ];  joint erythema[  ];  joint pain[  ];  back pain[ y ];  Heme/Lymph: bruising[  ];  bleeding[  ];  anemia[  ];  Neuro: TIA[  ];  headaches[  ];  stroke[  ];  vertigo[  ];  seizures[  ];   paresthesias[  ];  difficulty walking[   ];  Psych:depression[  ]; anxiety[  ];  Endocrine: diabetes[  ];  thyroid dysfunction[  ];  y  Past Medical History  Diagnosis Date  . Diabetes mellitus   . Hypertension   . Arthritis   . Hypokalemia   . Asthma   . Sickle cell trait   . Atrial fibrillation     a. Dx 03/2013.     (Not in a hospital admission) No current facility-administered medications for this encounter.  Current outpatient prescriptions:  .  aspirin EC 81 MG tablet, Take 81  mg by mouth daily., Disp: , Rfl:  .  diltiazem (CARDIZEM CD) 120 MG 24 hr capsule, Take 1 capsule (120 mg total) by mouth daily., Disp: 30 capsule, Rfl: 11 .  glipiZIDE (GLUCOTROL) 5 MG tablet, TAKE 1/2 TABLET TWICE DAILY, Disp: 30 tablet, Rfl: 0 .  insulin NPH-regular Human (NOVOLIN 70/30) (70-30) 100 UNIT/ML injection, Inject 0-9 Units into the skin 2 (two) times daily with a meal. Sliding scale, Disp: , Rfl:  .  fluconazole (DIFLUCAN) 150 MG tablet, Take 1 tablet (150 mg total) by mouth once. (Patient not taking: Reported on 01/15/2015), Disp: 1 tablet, Rfl: 0 .  insulin aspart (NOVOLOG) 100 UNIT/ML injection, CBG 70 - 120: 0 units CBG 121 - 150: 1 unit CBG 151 - 200: 2 units CBG 201 - 250: 3 units CBG 251 - 300: 5 units CBG 301 - 350: 7 units CBG 351 - 400: 9 units (Patient not taking: Reported on 01/15/2015), Disp: 10 mL, Rfl: 2 .  insulin aspart protamine- aspart (NOVOLOG MIX 70/30) (70-30) 100 UNIT/ML injection, Inject 0.08 mLs (8 Units total) into the skin 2 (two) times daily with a meal. (Patient not taking: Reported on 01/15/2015), Disp: 30 mL, Rfl: 3 .  metroNIDAZOLE (FLAGYL) 500 MG tablet, Take 1 tablet (500 mg total) by mouth 2 (two) times daily. (Patient not taking: Reported on 01/15/2015), Disp: 14 tablet, Rfl: 0 .  NOVOLIN 70/30 (70-30) 100 UNIT/ML injection, INJECT 8 UNITS INTO THE SKIN 2 TIMES DAILY WITH A MEAL (Patient not taking: Reported on 01/15/2015), Disp: 30 mL, Rfl: 0 .  Vitamin D, Ergocalciferol, (DRISDOL) 50000 UNITS CAPS  capsule, Take 1 capsule (50,000 Units total) by mouth every 7 (seven) days. (Patient not taking: Reported on 12/22/2014), Disp: 12 capsule, Rfl: 0     Infusions:   Allergies  Allergen Reactions  . Metoprolol Other (See Comments)    "Patient slept for whole day after starting medication this week.  Patient does not want to take this med.  Not sure if med causes hypotension, but could not get out of bed for any reason"  . Other Other (See Comments)    ANESTHESIA-CAUSES CARDIAC ARREST AND LUNGS COLLAPSED IN 1988, 2008.    . Latex Itching    Swell up  . Pork-Derived Products Palpitations and Other (See Comments)    Also raises Blood pressure-Patient avoids pork and pork products    History   Social History  . Marital Status: Legally Separated    Spouse Name: N/A  . Number of Children: N/A  . Years of Education: N/A   Occupational History  . Not on file.   Social History Main Topics  . Smoking status: Current Every Day Smoker -- 0.25 packs/day    Types: Cigarettes  . Smokeless tobacco: Never Used  . Alcohol Use: No  . Drug Use: No  . Sexual Activity: Yes   Other Topics Concern  . Not on file   Social History Narrative    Family History  Problem Relation Age of Onset  . Heart disease Mother   . Diabetes Mother   . Sickle cell anemia Mother   . Cancer Father     colon    PHYSICAL EXAM: Filed Vitals:   01/15/15 0045  BP: 109/61  Pulse: 81  Temp:   Resp: 21    No intake or output data in the 24 hours ending 01/15/15 0110  General:  Well appearing. No respiratory difficulty HEENT: normal Neck: supple. no JVD. Carotids 2+ bilat;  no bruits. No lymphadenopathy or thryomegaly appreciated. Cor: PMI nondisplaced. Regular rate & rhythm. No rubs, gallops or murmurs. Lungs: clear, she is tender in left scapular area and sternal and left parasternal area + Abdomen: soft, nontender, nondistended. No hepatosplenomegaly. No bruits or masses. Good bowel  sounds. Extremities: no cyanosis, clubbing, rash, edema Neuro: alert & oriented x 3, cranial nerves grossly intact. moves all 4 extremities w/o difficulty. Affect pleasant.  ECG:  NSR, no acute chnages     Results for orders placed or performed during the hospital encounter of 01/14/15 (from the past 24 hour(s))  CBC     Status: Abnormal   Collection Time: 01/14/15 10:41 PM  Result Value Ref Range   WBC 13.7 (H) 4.0 - 10.5 K/uL   RBC 4.32 3.87 - 5.11 MIL/uL   Hemoglobin 12.8 12.0 - 15.0 g/dL   HCT 37.1 36.0 - 46.0 %   MCV 85.9 78.0 - 100.0 fL   MCH 29.6 26.0 - 34.0 pg   MCHC 34.5 30.0 - 36.0 g/dL   RDW 13.5 11.5 - 15.5 %   Platelets 253 150 - 400 K/uL  Basic metabolic panel     Status: Abnormal   Collection Time: 01/14/15 10:41 PM  Result Value Ref Range   Sodium 139 135 - 145 mmol/L   Potassium 3.2 (L) 3.5 - 5.1 mmol/L   Chloride 103 101 - 111 mmol/L   CO2 23 22 - 32 mmol/L   Glucose, Bld 266 (H) 65 - 99 mg/dL   BUN <5 (L) 6 - 20 mg/dL   Creatinine, Ser 0.68 0.44 - 1.00 mg/dL   Calcium 8.6 (L) 8.9 - 10.3 mg/dL   GFR calc non Af Amer >60 >60 mL/min   GFR calc Af Amer >60 >60 mL/min   Anion gap 13 5 - 15  BNP (order ONLY if patient complains of dyspnea/SOB AND you have documented it for THIS visit)     Status: None   Collection Time: 01/14/15 10:41 PM  Result Value Ref Range   B Natriuretic Peptide 16.8 0.0 - 100.0 pg/mL  I-stat troponin, ED  (not at [facility], [facility])     Status: None   Collection Time: 01/14/15 10:42 PM  Result Value Ref Range   Troponin i, poc 0.01 0.00 - 0.08 ng/mL   Comment 3           Dg Chest 2 View  01/14/2015   CLINICAL DATA:  Patient with pounding sensation in her chest. Left-sided weakness.  EXAM: CHEST  2 VIEW  COMPARISON:  Chest radiograph 03/06/2014  FINDINGS: Stable cardiac and mediastinal contours. No consolidative pulmonary opacities. No pleural effusion or pneumothorax. Mid thoracic spine degenerative changes.  IMPRESSION: No acute  cardiopulmonary process.   Electronically Signed   By: Lovey Newcomer M.D.   On: 01/14/2015 23:08     ASSESSMENT: Chest pain non cardiac in etiology and likely musculoskeletal in origin in setting of cough, chills and possible mild bronchitis (leukocytosis +)   PLAN/DISCUSSION: Repeat 2nd set of trop if negative no further cardiac workup at this point. Once she recovers from URI she should follow up with Dr. Johnsie Cancel in office and further non -invasive testing can be performed if deemed appropriate I have discussed this with the patient in detail .she understands this and AGREES WITH THIS PLAN. Also discussed with ED MD Dr. Dina Rich Upper resp infection ? Cough treatment /managment per ED MD   Thankyou for the consult, I will inform Dr. Kyla Balzarine office  Sulaiman Durenda Age, MD

## 2015-01-19 ENCOUNTER — Encounter: Payer: Self-pay | Admitting: Cardiovascular Disease

## 2015-01-26 ENCOUNTER — Emergency Department (INDEPENDENT_AMBULATORY_CARE_PROVIDER_SITE_OTHER)
Admission: EM | Admit: 2015-01-26 | Discharge: 2015-01-26 | Disposition: A | Discharge status: Home / Self Care | Payer: Self-pay | Source: Home / Self Care | Attending: Emergency Medicine | Admitting: Emergency Medicine

## 2015-01-26 ENCOUNTER — Encounter (HOSPITAL_COMMUNITY): Payer: Self-pay | Admitting: Emergency Medicine

## 2015-01-26 DIAGNOSIS — M5431 Sciatica, right side: Secondary | ICD-10-CM

## 2015-01-26 DIAGNOSIS — M1990 Unspecified osteoarthritis, unspecified site: Secondary | ICD-10-CM

## 2015-01-26 MED ORDER — PREDNISONE 50 MG PO TABS: ORAL_TABLET | ORAL | Status: DC

## 2015-01-26 MED ORDER — TRAMADOL HCL 50 MG PO TABS: 50.0000 mg | ORAL_TABLET | Freq: Four times a day (QID) | ORAL | Status: DC | PRN

## 2015-01-26 MED ORDER — NAPROXEN 500 MG PO TABS: 500.0000 mg | ORAL_TABLET | Freq: Two times a day (BID) | ORAL | Status: DC

## 2015-01-26 NOTE — Discharge Instructions (Signed)
Your arthritis is flared up. This is likely due to the increase in walking. Take prednisone 1 pill daily for 5 days. Watch your sugar carefully while you are on this. Take Naprosyn 1 pill twice a day for the next 2 weeks, then as needed. Use tramadol at bedtime as needed to help you rest. This is a narcotic medication. Do not drive all taking it. Follow-up as needed.

## 2015-01-26 NOTE — ED Notes (Signed)
C/o lower back pain States she has hx of arthritis  States pain is lower back and radiates to hips and down to ankles

## 2015-01-26 NOTE — ED Provider Notes (Signed)
CSN: [id number]     Arrival date & time 01/26/15  1022 History   First MD Initiated Contact with Patient 01/26/15 1118     Chief Complaint  Patient presents with  . Back Pain   (Consider location/radiation/quality/duration/timing/severity/associated sxs/prior Treatment) HPI  She is a 54 year old woman here for evaluation of back pain. She states she has arthritis.  She started a new job a few days ago that involves a lot of walking. Since that time, her lower back, hips, knees, and ankles are aching. She does report occasional shooting pain into her right thigh. No numbness, tingling, weakness. She has tried ibuprofen without improvement. She states in the past she was on Naprosyn with good improvement.  Past Medical History  Diagnosis Date  . Diabetes mellitus   . Hypertension   . Arthritis   . Hypokalemia   . Asthma   . Sickle cell trait   . Atrial fibrillation     a. Dx 03/2013.   Past Surgical History  Procedure Laterality Date  . Hernia repair     Family History  Problem Relation Age of Onset  . Heart disease Mother   . Diabetes Mother   . Sickle cell anemia Mother   . Cancer Father     colon   History  Substance Use Topics  . Smoking status: Current Every Day Smoker -- 0.25 packs/day    Types: Cigarettes  . Smokeless tobacco: Never Used  . Alcohol Use: No   OB History    Gravida Para Term Preterm AB TAB SAB Ectopic Multiple Living   6 4 1 3 2  2   3      Review of Systems As in history of present illness Allergies  Metoprolol; Other; Latex; and Pork-derived products  Home Medications   Prior to Admission medications   Medication Sig Start Date End Date Taking? Authorizing Provider  aspirin EC 81 MG tablet Take 81 mg by mouth daily.    Historical Provider, MD  diltiazem (CARDIZEM CD) 120 MG 24 hr capsule Take 1 capsule (120 mg total) by mouth daily. 05/24/14   Josue Hector, MD  glipiZIDE (GLUCOTROL) 5 MG tablet TAKE 1/2 TABLET TWICE DAILY 12/24/14   Lorayne Marek, MD  insulin NPH-regular Human (NOVOLIN 70/30) (70-30) 100 UNIT/ML injection Inject 0-9 Units into the skin 2 (two) times daily with a meal. Sliding scale    Historical Provider, MD  naproxen (NAPROSYN) 500 MG tablet Take 1 tablet (500 mg total) by mouth 2 (two) times daily with a meal. 01/26/15   Melony Overly, MD  predniSONE (DELTASONE) 50 MG tablet Take 1 pill daily for 5 days. 01/26/15   Melony Overly, MD  traMADol (ULTRAM) 50 MG tablet Take 1 tablet (50 mg total) by mouth every 6 (six) hours as needed. 01/26/15   Melony Overly, MD   BP 119/81 mmHg  Pulse 88  Temp(Src) 97.8 F (36.6 C) (Oral)  Resp 16  SpO2 96%  LMP 12/20/2014 Physical Exam  Constitutional: She is oriented to person, place, and time. She appears well-developed and well-nourished. No distress.  She is able to get on the exam table with moderate difficulty.  Cardiovascular: Normal rate.   Pulmonary/Chest: Effort normal.  Musculoskeletal:  Back: No erythema or edema. No vertebral step-offs. She is diffusely tender across the lower back. She is also tender in the right buttock and at the right greater trochanter. Positive seated straight leg raise on the right. Bilateral knees with crepitus.  2+ DP pulses bilaterally.  Neurological: She is alert and oriented to person, place, and time.    ED Course  Procedures (including critical care time) Labs Review Labs Reviewed - No data to display  Imaging Review No results found.   MDM   1. Arthritis   2. Right sided sciatica    Will treat with prednisone. Discussed that this may raise her blood sugar. Naprosyn twice a day. Prescription for tramadol given to use at night as needed. Follow-up with PCP.    Melony Overly, MD 01/26/15 1209

## 2015-01-27 ENCOUNTER — Encounter: Payer: Self-pay | Admitting: Physician Assistant

## 2015-01-27 ENCOUNTER — Encounter: Payer: Self-pay | Admitting: Cardiology

## 2015-01-27 DIAGNOSIS — D72829 Elevated white blood cell count, unspecified: Secondary | ICD-10-CM | POA: Insufficient documentation

## 2015-01-27 NOTE — Progress Notes (Signed)
This encounter was created in error - please disregard.

## 2015-01-27 NOTE — Progress Notes (Deleted)
Cardiology Office Note Date:  01/27/2015  Patient ID:  Anna Golden, Anna Golden 10-16-1960, MRN [id number] PCP:  Angelica Chessman, MD  Cardiologist: Dr. Johnsie Cancel  ***refresh   Chief Complaint: follow up ER visit for chest pain  History of Present Illness: Anna Golden is a 54 y.o. female with history of DM, HTN, obesity, asthma, PAF, sickle cell trait, arthritis, leukocytosis, poor social situation, tobacco abuse who presents for follow-up of chest pain. She was diagnosed with AF in 03/2013 when she spontaneously converted to NSR. 2D Echo 03/2013: EF 60-65%, grade 2 DD. She was discharged on Xarelto but could not continue this due to living in a shelter and inability to afford this. Outpatient nuc 04/2013 was normal with EF 62%. She was also noted to have a seemingly chronic appearing leukocytosis and was recommended to f/u PCP  which she did and was treated for an axillary abscess. She was admitted 02/2014 for asthma exacerbation and DKA and remained in NSR per notes. She saw Dr. Johnsie Cancel in 04/2014 at which time she was doing well on diltiazem. Most recently she was seen in the ED 01/15/15 with constant chest pain radiating to her back, worse with deep breathing and cough, associated with chills. EKG NSR without acute changes. She had chest wall tenderness to palpation. Troponins were negative and treatment for URI was recommended. CBC again showed leukocytosis with WBC 13.7, normal Hgb, K 3.2, glucose 266. Outpatient follow-up was recommended. CHADSVASC = 3 for HTN, diabetes, female.    Past Medical History  Diagnosis Date  . Diabetes mellitus   . Hypertension   . Arthritis   . Hypokalemia   . Asthma   . Sickle cell trait   . PAF (paroxysmal atrial fibrillation)     a. Dx 03/2013, spont converted to NSR.  Marland Kitchen Poor social situation   . Obesity   . Leukocytosis     Past Surgical History  Procedure Laterality Date  . Hernia repair      Current Outpatient Prescriptions  Medication Sig  Dispense Refill  . aspirin EC 81 MG tablet Take 81 mg by mouth daily.    Marland Kitchen diltiazem (CARDIZEM CD) 120 MG 24 hr capsule Take 1 capsule (120 mg total) by mouth daily. 30 capsule 11  . glipiZIDE (GLUCOTROL) 5 MG tablet TAKE 1/2 TABLET TWICE DAILY 30 tablet 0  . insulin NPH-regular Human (NOVOLIN 70/30) (70-30) 100 UNIT/ML injection Inject 0-9 Units into the skin 2 (two) times daily with a meal. Sliding scale    . naproxen (NAPROSYN) 500 MG tablet Take 1 tablet (500 mg total) by mouth 2 (two) times daily with a meal. 60 tablet 0  . predniSONE (DELTASONE) 50 MG tablet Take 1 pill daily for 5 days. 5 tablet 0  . traMADol (ULTRAM) 50 MG tablet Take 1 tablet (50 mg total) by mouth every 6 (six) hours as needed. 15 tablet 0  . [DISCONTINUED] insulin aspart (NOVOLOG) 100 UNIT/ML injection CBG 70 - 120: 0 units CBG 121 - 150: 1 unit CBG 151 - 200: 2 units CBG 201 - 250: 3 units CBG 251 - 300: 5 units CBG 301 - 350: 7 units CBG 351 - 400: 9 units (Patient not taking: Reported on 01/15/2015) 10 mL 2  . [DISCONTINUED] insulin aspart protamine- aspart (NOVOLOG MIX 70/30) (70-30) 100 UNIT/ML injection Inject 0.08 mLs (8 Units total) into the skin 2 (two) times daily with a meal. (Patient not taking: Reported on 01/15/2015) 30 mL 3   No  current facility-administered medications for this visit.    Allergies:   Metoprolol; Other; Latex; and Pork-derived products   Social History:  The patient  reports that she has been smoking Cigarettes.  She has been smoking about 0.25 packs per day. She has never used smokeless tobacco. She reports that she does not drink alcohol or use illicit drugs.   Family History:  The patient's family history includes Cancer in her father; Diabetes in her mother; Heart disease in her mother; Sickle cell anemia in her mother.***  ROS:  Please see the history of present illness. Otherwise, review of systems is positive for ***.   All other systems are reviewed and otherwise negative.     PHYSICAL EXAM: *** VS:  LMP 12/20/2014 BMI: There is no weight on file to calculate BMI. Well nourished, well developed, in no acute distress HEENT: normocephalic, atraumatic Neck: no JVD, carotid bruits or masses Cardiac:  normal S1, S2; RRR; no murmurs, rubs, or gallops Lungs:  clear to auscultation bilaterally, no wheezing, rhonchi or rales Abd: soft, nontender, no hepatomegaly, + BS MS: no deformity or atrophy Ext: no edema Skin: warm and dry, no rash Neuro:  moves all extremities spontaneously, no focal abnormalities noted, follows commands Psych: euthymic mood, full affect   EKG:  Done today shows ***  Recent Labs: 03/06/2014: Pro B Natriuretic peptide (BNP) 21.8 03/08/2014: Magnesium 1.7 04/08/2014: TSH 1.267 12/22/2014: ALT 13 01/14/2015: B Natriuretic Peptide 16.8; BUN <5*; Creatinine 0.68; Hemoglobin 12.8; Platelets 253; Potassium 3.2*; Sodium 139  04/08/2014: Cholesterol, Total 105; HDL-C 42; LDL (calc) 45; Total CHOL/HDL Ratio 2.5; Triglycerides 92; VLDL 18   CrCl cannot be calculated (Unknown ideal weight.).   Wt Readings from Last 3 Encounters:  12/22/14 189 lb (85.73 kg)  05/24/14 231 lb 12.8 oz (105.144 kg)  03/18/14 227 lb (102.967 kg)     Other studies reviewed: Additional studies/records reviewed today include: summarized above***  ASSESSMENT AND PLAN:  1. Recent chest pain 2. Paroxysmal atrial fibrillation 3. Essential HTN 4. Leukocytosis 5. Obesity 6. Hypokalemia  rx chest pain hypokalemia repeat BMET and Mg tobacco anticoagulation  Disposition: F/u with ***  Current medicines are reviewed at length with the patient today.  The patient did not have any concerns regarding medicines.***  Signed, Melina Copa PA-C 01/27/2015 8:35 AM     [address] [place] [address] [phone] (office)  [phone] (fax)

## 2015-02-11 ENCOUNTER — Ambulatory Visit: Payer: Self-pay | Admitting: Internal Medicine

## 2015-02-16 ENCOUNTER — Emergency Department (INDEPENDENT_AMBULATORY_CARE_PROVIDER_SITE_OTHER)
Admission: EM | Admit: 2015-02-16 | Discharge: 2015-02-16 | Disposition: A | Discharge status: Home / Self Care | Payer: Self-pay | Source: Home / Self Care | Attending: Emergency Medicine | Admitting: Emergency Medicine

## 2015-02-16 ENCOUNTER — Encounter (HOSPITAL_COMMUNITY): Payer: Self-pay | Admitting: Emergency Medicine

## 2015-02-16 DIAGNOSIS — M1990 Unspecified osteoarthritis, unspecified site: Secondary | ICD-10-CM

## 2015-02-16 DIAGNOSIS — R1030 Lower abdominal pain, unspecified: Secondary | ICD-10-CM

## 2015-02-16 LAB — POCT URINALYSIS DIP (DEVICE)
Bilirubin Urine: NEGATIVE
GLUCOSE, UA: NEGATIVE mg/dL
Hgb urine dipstick: NEGATIVE
KETONES UR: NEGATIVE mg/dL
LEUKOCYTES UA: NEGATIVE
Nitrite: NEGATIVE
Protein, ur: NEGATIVE mg/dL
SPECIFIC GRAVITY, URINE: 1.015 (ref 1.005–1.030)
Urobilinogen, UA: 1 mg/dL (ref 0.0–1.0)
pH: 6 (ref 5.0–8.0)

## 2015-02-16 LAB — POCT PREGNANCY, URINE: PREG TEST UR: NEGATIVE

## 2015-02-16 MED ORDER — GABAPENTIN 300 MG PO CAPS: ORAL_CAPSULE | ORAL | Status: DC

## 2015-02-16 MED ORDER — MEDROXYPROGESTERONE ACETATE 10 MG PO TABS: 10.0000 mg | ORAL_TABLET | Freq: Every day | ORAL | Status: DC

## 2015-02-16 NOTE — ED Notes (Signed)
C/o abd pain and arthritis  States she was dx with fibroids in 2011 States pain comes and goes Does have arthritis pain  Was given naproxen and tramadol which she states doesn't help

## 2015-02-16 NOTE — ED Provider Notes (Signed)
CSN: [id number]     Arrival date & time 02/16/15  1458 History   First MD Initiated Contact with Patient 02/16/15 1512     Chief Complaint  Patient presents with  . Arthritis  . Abdominal Pain   (Consider location/radiation/quality/duration/timing/severity/associated sxs/prior Treatment) HPI  She is a 54 year old woman here for evaluation of abdominal pain. She states she woke up yesterday reported with intense pain in her lower abdomen. It gradually eased up. The pain has come and gone for the last day. It typically lasts seconds to minutes. It shifts around in her lower abdomen. It is associated with some nausea. No vomiting or diarrhea. She reports a normal bowel movement today.  She states she has had these pains before typically they'll typically last for a few days then resolved for several weeks. She was diagnosed with fibroids several years ago.  At that time she was treated with hormone therapy and her pain improved.  Past Medical History  Diagnosis Date  . Diabetes mellitus   . Hypertension   . Arthritis   . Hypokalemia   . Asthma   . Sickle cell trait   . PAF (paroxysmal atrial fibrillation)     a. Dx 03/2013, spont converted to NSR.  Marland Kitchen Poor social situation   . Obesity   . Leukocytosis    Past Surgical History  Procedure Laterality Date  . Hernia repair     Family History  Problem Relation Age of Onset  . Heart disease Mother   . Diabetes Mother   . Sickle cell anemia Mother   . Cancer Father     colon   History  Substance Use Topics  . Smoking status: Current Every Day Smoker -- 0.25 packs/day    Types: Cigarettes  . Smokeless tobacco: Never Used  . Alcohol Use: No   OB History    Gravida Para Term Preterm AB TAB SAB Ectopic Multiple Living   6 4 1 3 2  2   3      Review of Systems  Allergies  Metoprolol; Other; Latex; and Pork-derived products  Home Medications   Prior to Admission medications   Medication Sig Start Date End Date Taking?  Authorizing Provider  aspirin EC 81 MG tablet Take 81 mg by mouth daily.    Historical Provider, MD  diltiazem (CARDIZEM CD) 120 MG 24 hr capsule Take 1 capsule (120 mg total) by mouth daily. 05/24/14   Josue Hector, MD  gabapentin (NEURONTIN) 300 MG capsule Take at bedtime for 2 days, then twice a day for 2 days, then 3 times a day 02/16/15   Melony Overly, MD  glipiZIDE (GLUCOTROL) 5 MG tablet TAKE 1/2 TABLET TWICE DAILY 12/24/14   Lorayne Marek, MD  insulin NPH-regular Human (NOVOLIN 70/30) (70-30) 100 UNIT/ML injection Inject 0-9 Units into the skin 2 (two) times daily with a meal. Sliding scale    Historical Provider, MD  medroxyPROGESTERone (PROVERA) 10 MG tablet Take 1 tablet (10 mg total) by mouth daily. 02/16/15   Melony Overly, MD  naproxen (NAPROSYN) 500 MG tablet Take 1 tablet (500 mg total) by mouth 2 (two) times daily with a meal. 01/26/15   Melony Overly, MD  predniSONE (DELTASONE) 50 MG tablet Take 1 pill daily for 5 days. 01/26/15   Melony Overly, MD  traMADol (ULTRAM) 50 MG tablet Take 1 tablet (50 mg total) by mouth every 6 (six) hours as needed. 01/26/15   Melony Overly, MD  BP 120/80 mmHg  Pulse 104  Temp(Src) 98.1 F (36.7 C) (Oral)  Resp 12  SpO2 98% Physical Exam  Constitutional: She is oriented to person, place, and time. She appears well-developed and well-nourished. No distress.  Cardiovascular:  Slightly tachycardic  Pulmonary/Chest: Effort normal.  Abdominal: Soft. Bowel sounds are normal. She exhibits no distension and no mass. There is no tenderness. There is no rebound and no guarding.  Neurological: She is alert and oriented to person, place, and time.    ED Course  Procedures (including critical care time) Labs Review Labs Reviewed  POCT URINALYSIS DIP (DEVICE)  POCT PREGNANCY, URINE    Imaging Review No results found.   MDM   1. Lower abdominal pain   2. Arthritis    Lower abdominal pain likely coming from fibroid uterus. Will treat with a 14 day  course of Provera. Avoid estrogens given she is over 50 and a current smoker. As the Naprosyn and tramadol are not helping her arthritis pain, we will stop these medications. I recommended a trial of gabapentin. [facility].    Melony Overly, MD 02/16/15 [phone]

## 2015-02-16 NOTE — Discharge Instructions (Signed)
You can stop the Naprosyn and tramadol. Start taking gabapentin. Take at bedtime for the next 2 days, then twice a day for 2 days, then 3 times a day. This medicine may make you sleepy. It makes you too drowsy during the day, just take it at bedtime. Take the Provera daily for the next 2 weeks to see if that will help the abdominal pain. [facility] on July 5. You will likely need to see an OB/GYN doctor about your fibroids.

## 2015-03-01 ENCOUNTER — Ambulatory Visit: Payer: Self-pay | Admitting: Internal Medicine

## 2015-03-21 ENCOUNTER — Encounter: Payer: Self-pay | Admitting: Physician Assistant

## 2017-02-04 IMAGING — CR DG CHEST 2V
2 series · 2 of 2 positions shown · non-contrast
Comparison: Chest radiograph 03/06/2014

CLINICAL DATA: Patient with pounding sensation in her chest.
Left-sided weakness.

EXAM:
CHEST  2 VIEW

[chest pa]
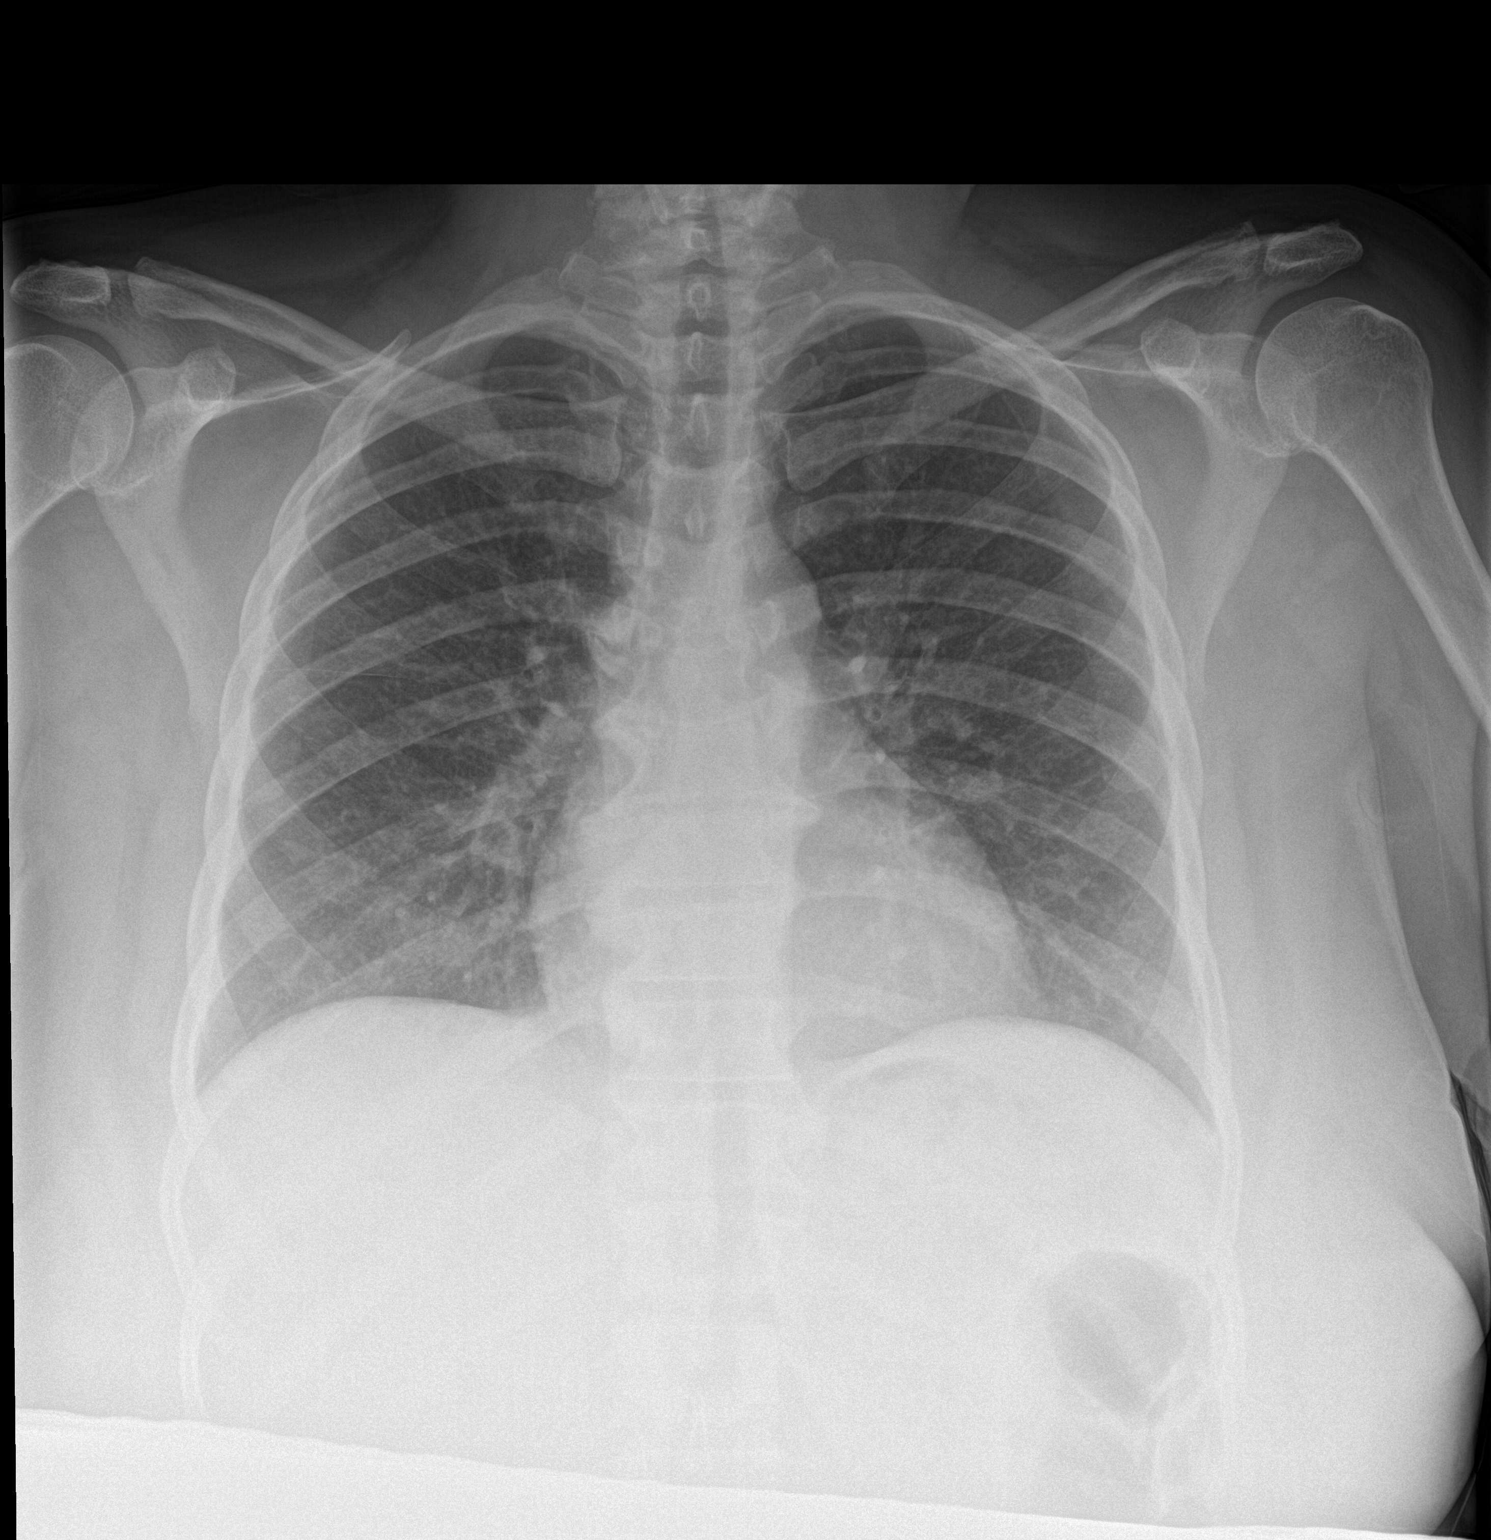

[chest lat]
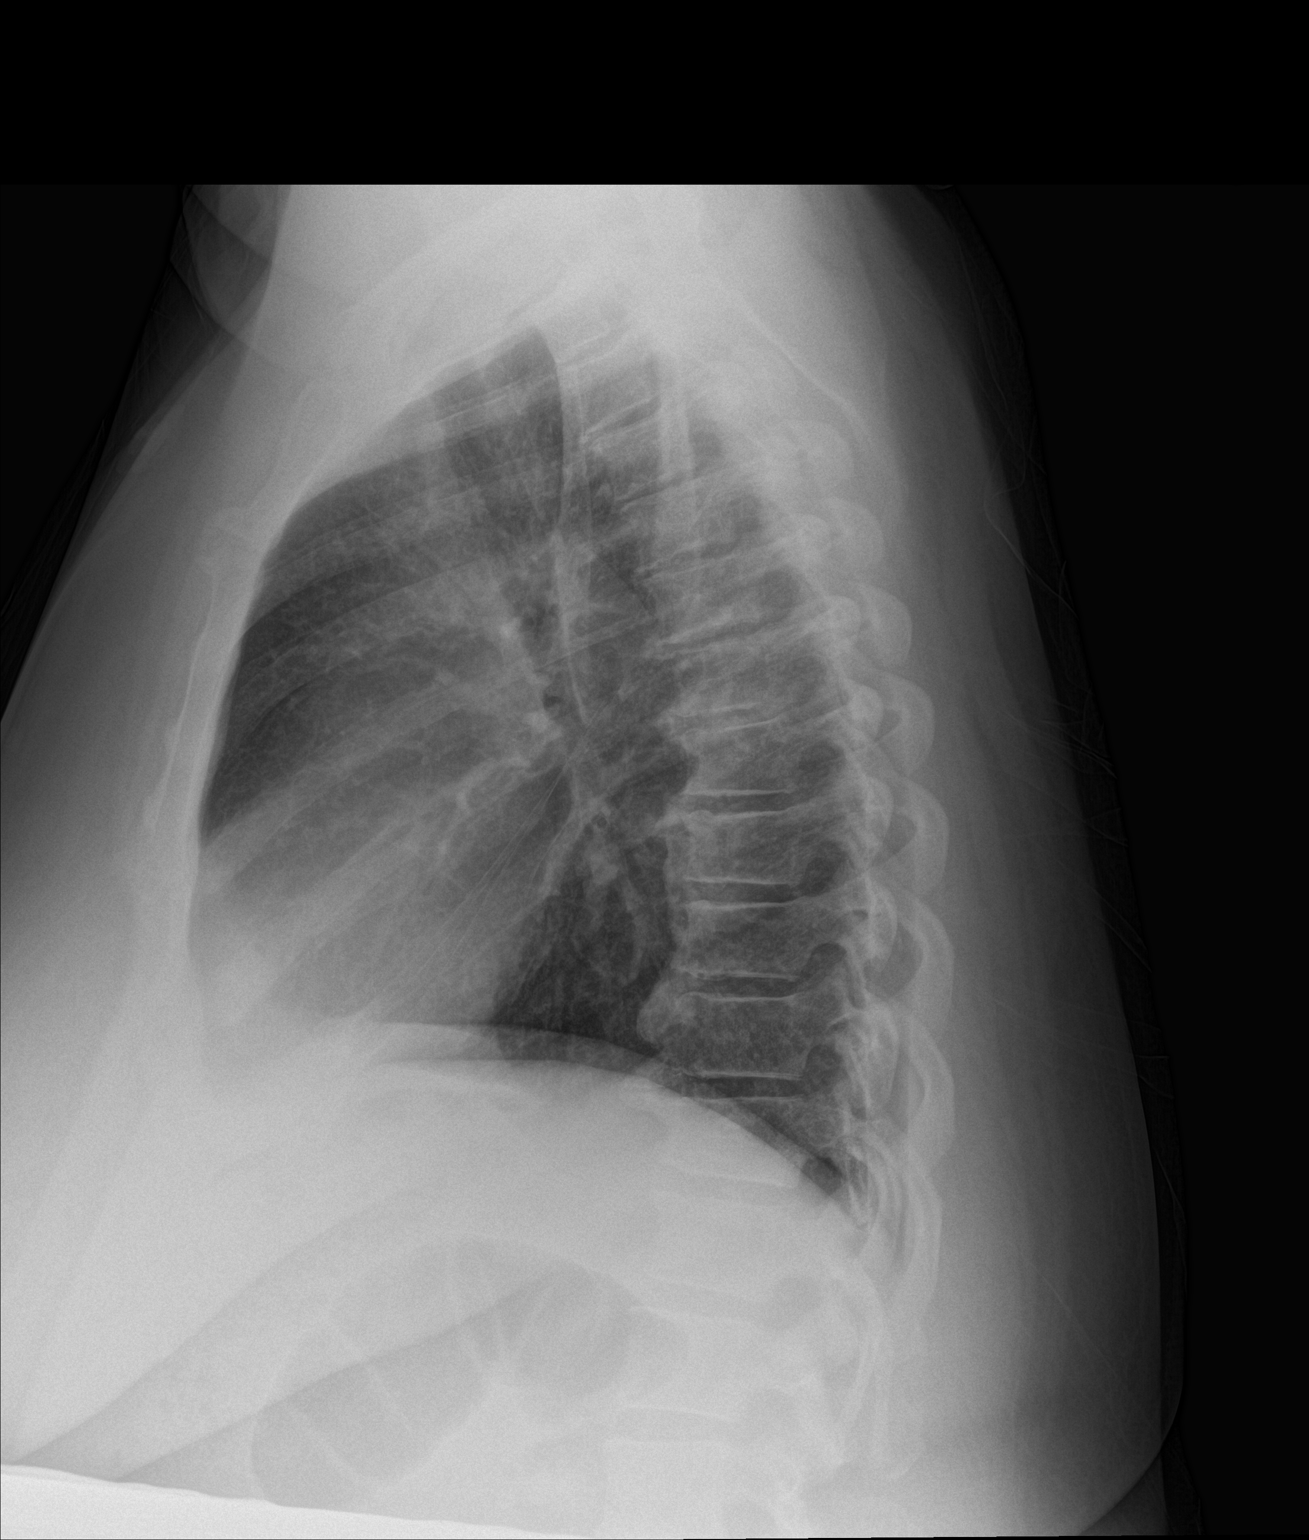

[2 of 2 positions shown; findings below may reference images not displayed]

FINDINGS: Stable cardiac and mediastinal contours. No consolidative pulmonary
opacities. No pleural effusion or pneumothorax. Mid thoracic spine
degenerative changes.
IMPRESSION: No acute cardiopulmonary process.

## 2019-02-04 ENCOUNTER — Other Ambulatory Visit: Payer: Self-pay | Admitting: *Deleted

## 2019-02-04 DIAGNOSIS — Z20822 Contact with and (suspected) exposure to covid-19: Secondary | ICD-10-CM

## 2019-02-05 LAB — NOVEL CORONAVIRUS, NAA: SARS-CoV-2, NAA: NOT DETECTED

## 2019-02-24 ENCOUNTER — Telehealth: Payer: Self-pay | Admitting: *Deleted

## 2019-02-24 NOTE — Telephone Encounter (Signed)
Patient returned call for test results- informed of negative COVID results- patient has no symptoms and will follow up if she should develop any.

## 2019-03-20 ENCOUNTER — Encounter (HOSPITAL_COMMUNITY): Payer: Self-pay

## 2019-03-20 ENCOUNTER — Other Ambulatory Visit: Payer: Self-pay

## 2019-03-20 ENCOUNTER — Ambulatory Visit (HOSPITAL_COMMUNITY)
Admission: EM | Admit: 2019-03-20 | Discharge: 2019-03-20 | Disposition: A | Discharge status: Home / Self Care | Payer: Self-pay | Attending: Family Medicine | Admitting: Family Medicine

## 2019-03-20 DIAGNOSIS — R1013 Epigastric pain: Secondary | ICD-10-CM

## 2019-03-20 HISTORY — DX: Fatty (change of) liver, not elsewhere classified: K76.0

## 2019-03-20 MED ORDER — OMEPRAZOLE 20 MG PO CPDR: 20.0000 mg | DELAYED_RELEASE_CAPSULE | Freq: Two times a day (BID) | ORAL | 0 refills | Status: DC

## 2019-03-20 NOTE — ED Provider Notes (Signed)
[facility]    CSN: [id number] Arrival date & time: 03/20/19  1412      History   Chief Complaint Chief Complaint  Patient presents with  . Abdominal Pain    HPI Anna Golden is a 58 y.o. female.   HPI  Patient is recently moved back to the area after living in [place] for the last couple of years.  She states that she was diagnosed with an ultrasound is having fatty liver disease.  For the last week or so she has been having intermittent abdominal pain.  She is worried that she has symptoms from her fatty liver.  She wonders if she should get lab testing or have treatment for this. She is a known insulin-dependent diabetic.  Hypertension.  Hyperlipidemia.  Takes gabapentin for neuropathy. She does not have early satiety, nausea, or difficulty eating.  No constipation or diarrhea. She does have pain at night.  Past Medical History:  Diagnosis Date  . Arthritis   . Asthma   . Diabetes mellitus   . Fatty liver disease, nonalcoholic   . Hypertension   . Hypokalemia   . Leukocytosis   . Obesity   . PAF (paroxysmal atrial fibrillation) ([place])    a. Dx 03/2013, spont converted to NSR.  Marland Kitchen Poor social situation   . Sickle cell trait [facility])     Patient Active Problem List   Diagnosis Date Noted  . Leukocytosis   . Smoking 03/18/2014  . Arthritis 03/18/2014  . DKA (diabetic ketoacidoses) ([place]) 03/07/2014  . Acute asthma exacerbation 03/07/2014  . PAF (paroxysmal atrial fibrillation) ([place]) 04/24/2013  . Anginal pain ([place]) 04/24/2013  . DM (diabetes mellitus) ([place]) 04/24/2013  . Hypertension 04/24/2013  . Obesity 04/24/2013  . Leiomyoma of uterus, unspecified 01/23/2013  . Dysmenorrhea 01/23/2013  . Menorrhagia with regular cycle 01/23/2013    Past Surgical History:  Procedure Laterality Date  . HERNIA REPAIR      OB History    Gravida  6   Para  4   Term  1   Preterm  3   AB  2   Living  3     SAB  2   TAB      Ectopic      Multiple      Live Births  3            Home Medications    Prior to Admission medications   Medication Sig Start Date End Date Taking? Authorizing Provider  warfarin (COUMADIN) 5 MG tablet Take 5 mg by mouth daily.   Yes [provider]  aspirin EC 81 MG tablet Take 81 mg by mouth daily.    [provider]  diltiazem (CARDIZEM CD) 120 MG 24 hr capsule Take 1 capsule (120 mg total) by mouth daily. 05/24/14   Josue Hector, MD  gabapentin (NEURONTIN) 300 MG capsule Take at bedtime for 2 days, then twice a day for 2 days, then 3 times a day 02/16/15   Melony Overly, MD  glipiZIDE (GLUCOTROL) 5 MG tablet TAKE 1/2 TABLET TWICE DAILY 12/24/14   Lorayne Marek, MD  insulin NPH-regular Human (NOVOLIN 70/30) (70-30) 100 UNIT/ML injection Inject 0-9 Units into the skin 2 (two) times daily with a meal. Sliding scale    [provider]  medroxyPROGESTERone (PROVERA) 10 MG tablet Take 1 tablet (10 mg total) by mouth daily. 02/16/15   Melony Overly, MD  naproxen (NAPROSYN) 500 MG tablet  Take 1 tablet (500 mg total) by mouth 2 (two) times daily with a meal. 01/26/15   Melony Overly, MD  omeprazole (PRILOSEC) 20 MG capsule Take 1 capsule (20 mg total) by mouth 2 (two) times daily before a meal. 03/20/19   Raylene Everts, MD  predniSONE (DELTASONE) 50 MG tablet Take 1 pill daily for 5 days. 01/26/15   Melony Overly, MD  traMADol (ULTRAM) 50 MG tablet Take 1 tablet (50 mg total) by mouth every 6 (six) hours as needed. 01/26/15   Melony Overly, MD  insulin aspart (NOVOLOG) 100 UNIT/ML injection CBG 70 - 120: 0 units CBG 121 - 150: 1 unit CBG 151 - 200: 2 units CBG 201 - 250: 3 units CBG 251 - 300: 5 units CBG 301 - 350: 7 units CBG 351 - 400: 9 units Patient not taking: Reported on 01/15/2015 03/09/14 01/26/15  Hosie Poisson, MD  insulin aspart protamine- aspart (NOVOLOG MIX 70/30) (70-30) 100 UNIT/ML injection Inject 0.08 mLs (8 Units total) into the skin 2 (two) times daily with a  meal. Patient not taking: Reported on 01/15/2015 04/26/14 01/26/15  Lorayne Marek, MD    Family History Family History  Problem Relation Age of Onset  . Heart disease Mother   . Diabetes Mother   . Sickle cell anemia Mother   . Cancer Father        colon    Social History Social History   Tobacco Use  . Smoking status: Current Every Day Smoker    Packs/day: 0.25    Types: Cigarettes  . Smokeless tobacco: Never Used  Substance Use Topics  . Alcohol use: No  . Drug use: No     Allergies   Metoprolol, Other, Latex, and Pork-derived products   Review of Systems Review of Systems  Constitutional: Negative for chills and fever.  HENT: Negative for ear pain and sore throat.   Eyes: Negative for pain and visual disturbance.  Respiratory: Negative for cough and shortness of breath.   Cardiovascular: Negative for chest pain and palpitations.  Gastrointestinal: Positive for abdominal pain. Negative for vomiting.  Genitourinary: Negative for dysuria and hematuria.  Musculoskeletal: Negative for arthralgias and back pain.  Skin: Negative for color change and rash.  Neurological: Negative for seizures and syncope.  All other systems reviewed and are negative.    Physical Exam Triage Vital Signs ED Triage Vitals  Enc Vitals Group     BP 03/20/19 1451 (!) 160/90     Pulse Rate 03/20/19 1451 94     Resp 03/20/19 1451 17     Temp 03/20/19 1451 98.3 F (36.8 C)     Temp Source 03/20/19 1451 Oral     SpO2 03/20/19 1451 99 %     Weight --      Height --      Head Circumference --      Peak Flow --      Pain Score 03/20/19 1547 0     Pain Loc --      Pain Edu? --      Excl. in [place]? --    No data found.  Updated Vital Signs BP (!) 160/90 (BP Location: Right Arm)   Pulse 94   Temp 98.3 F (36.8 C) (Oral)   Resp 17   LMP 12/20/2014   SpO2 99%       Physical Exam Constitutional:      General: She is not in acute distress.  Appearance: She is well-developed. She  is obese.  HENT:     Head: Normocephalic and atraumatic.  Eyes:     Conjunctiva/sclera: Conjunctivae normal.     Pupils: Pupils are equal, round, and reactive to light.  Neck:     Musculoskeletal: Normal range of motion.  Cardiovascular:     Rate and Rhythm: Normal rate and regular rhythm.     Heart sounds: Normal heart sounds.  Pulmonary:     Effort: Pulmonary effort is normal. No respiratory distress.     Breath sounds: Normal breath sounds.  Abdominal:     General: Abdomen is protuberant. Bowel sounds are normal. There is no distension.     Palpations: Abdomen is soft. There is no hepatomegaly or splenomegaly.     Tenderness: There is abdominal tenderness in the epigastric area.     Hernia: No hernia is present.  Musculoskeletal: Normal range of motion.  Skin:    General: Skin is warm and dry.  Neurological:     General: No focal deficit present.     Mental Status: She is alert.  Psychiatric:        Behavior: Behavior normal.      UC Treatments / Results  Labs (all labs ordered are listed, but only abnormal results are displayed) Labs Reviewed - No data to display  EKG   Radiology No results found.  Procedures Procedures (including critical care time)  Medications Ordered in UC Medications - No data to display  Initial Impression / Assessment and Plan / UC Course  I have reviewed the triage vital signs and the nursing notes.  Pertinent labs & imaging results that were available during my care of the patient were reviewed by me and considered in my medical decision making (see chart for details).     Clinically patient has dyspepsia/GERD causing her abdominal pain.  We will treat her with a PPI.  She does have an appointment to establish with a PCP in a couple of weeks.  Is encouraged to keep this Final Clinical Impressions(s) / UC Diagnoses   Final diagnoses:  Abdominal pain, epigastric     Discharge Instructions     Take omeprazole 2 times a day on  an empty stomach This will reduce your stomach pain and stomach acid Keep your appointment with your primary care doctor on August 10   ED Prescriptions    Medication Sig Dispense Auth. Provider   omeprazole (PRILOSEC) 20 MG capsule Take 1 capsule (20 mg total) by mouth 2 (two) times daily before a meal. 60 capsule Raylene Everts, MD     Controlled Substance Prescriptions [place] Controlled Substance Registry consulted? Not Applicable   Raylene Everts, MD 03/20/19 2039

## 2019-03-20 NOTE — ED Triage Notes (Signed)
Pt presents with abdominal pain; pt was recently diagnosed with a fatty liver disease and has an appt to establich PCP on aug 52YE

## 2019-03-20 NOTE — Discharge Instructions (Addendum)
Take omeprazole 2 times a day on an empty stomach This will reduce your stomach pain and stomach acid Keep your appointment with your primary care doctor on August 10

## 2019-04-06 ENCOUNTER — Encounter: Payer: Self-pay | Admitting: Nurse Practitioner

## 2019-04-06 ENCOUNTER — Ambulatory Visit: Payer: Self-pay | Attending: Nurse Practitioner | Admitting: Nurse Practitioner

## 2019-04-06 ENCOUNTER — Other Ambulatory Visit: Payer: Self-pay

## 2019-04-06 DIAGNOSIS — E1165 Type 2 diabetes mellitus with hyperglycemia: Secondary | ICD-10-CM

## 2019-04-06 DIAGNOSIS — E1142 Type 2 diabetes mellitus with diabetic polyneuropathy: Secondary | ICD-10-CM

## 2019-04-06 DIAGNOSIS — E782 Mixed hyperlipidemia: Secondary | ICD-10-CM

## 2019-04-06 DIAGNOSIS — I10 Essential (primary) hypertension: Secondary | ICD-10-CM

## 2019-04-06 NOTE — Progress Notes (Signed)
Virtual Visit via Telephone Note Due to national recommendations of social distancing due to [place] 19, telehealth visit is felt to be most appropriate for this patient at this time.  I discussed the limitations, risks, security and privacy concerns of performing an evaluation and management service by telephone and the availability of in person appointments. I also discussed with the patient that there may be a patient responsible charge related to this service. The patient expressed understanding and agreed to proceed.    I connected with Anna Golden on 04/06/19  at   1:50 PM EDT  EDT by telephone and verified that I am speaking with the correct person using two identifiers.   Consent I discussed the limitations, risks, security and privacy concerns of performing an evaluation and management service by telephone and the availability of in person appointments. I also discussed with the patient that there may be a patient responsible charge related to this service. The patient expressed understanding and agreed to proceed.   Location of Patient: Private Residence   Location of Provider: [place] and [employer] Office    Persons participating in Telemedicine visit: [name] FNP-BC [place]    History of Present Illness: Telemedicine visit for: Establish Care  has a past medical history of Arthritis, Asthma, Diabetes mellitus, Fatty liver disease, nonalcoholic, Hypertension, Hypokalemia, Leukocytosis, Obesity, PAF (paroxysmal atrial fibrillation with spontaneous conversion to NSR 03-2013) ([place]), Poor social situation, and Sickle cell trait ([place]). She has not been seen in this office in over 3 years.  [place]oved [place] to [place] recently. She had a colonoscopy with several polyps removed 2018-2019. Was instructed to follow up in 1 year but never followed up.     She was seen by Cardiology several years ago. She was living in a shelter and  noncompliant with xarelto. As she had converted to NSR and was noncompliant with anticoagulation, Cardiology recommended patient could continue on ASA and cardizem. She currently states she had been prescribed pradaxa in [place]. Will hold off on either of these until she has EKG in office. Will continue ASA 81 mg as previously instructed.   Type 2 DM  Poorly controlled. Non compliant. She is not taking her Basaglar as of 2 weeks ago. States she feels like the needle is still inside of her. Makes her feel funny so she stopped taking it. Will try different size needles and restart her on basaglar 20 units daily and novolog 10 units TID. She will also need meter, statin and ARB. Taking gabapentin for neuropathy. Lab Results  Component Value Date   HGBA1C 12.3 (H) 04/07/2019   Lab Results  Component Value Date   HGBA1C 11.3 (H) 03/07/2014    Essential Hypertension Not well controlled. Taking cardizem 120 mg daily for rate control and HTN. Denies chest pain, shortness of breath, palpitations, lightheadedness, dizziness, headaches or BLE edema. NO BB due to hx of asthma/COPD. She continues to smoke. Does not monitor blood pressure at home.  BP Readings from Last 3 Encounters:  03/20/19 (!) 160/90  02/16/15 120/80  01/26/15 119/81    Mixed Hyperlipdemia LDL at goal. Will refill atorvastatin 40mg . She denies any statin intolerance or myalgias.  Lab Results  Component Value Date   CHOL 136 04/07/2019   CHOL 105 04/08/2014   CHOL 111 04/25/2013   Lab Results  Component Value Date   HDL 48 04/07/2019   HDL 42 04/08/2014   HDL 48 04/25/2013   Lab  Results  Component Value Date   LDLCALC 62 04/07/2019   LDLCALC 45 04/08/2014   LDLCALC 43 04/25/2013   Lab Results  Component Value Date   TRIG 129 04/07/2019   TRIG 92 04/08/2014   TRIG 99 04/25/2013   Lab Results  Component Value Date   CHOLHDL 2.8 04/07/2019   CHOLHDL 2.5 04/08/2014   CHOLHDL 2.3 04/25/2013    Atrial Fib Taking  Cardizem. She does endorse intermittent palpitations. However recent ER notes and previous cardiology notes state patient in NSR.   Past Medical History:  Diagnosis Date  . Arthritis   . Asthma   . Diabetes mellitus   . Fatty liver disease, nonalcoholic   . Hypertension   . Hypokalemia   . Leukocytosis   . Obesity   . PAF (paroxysmal atrial fibrillation) ([place])    a. Dx 03/2013, spont converted to NSR.  Marland Kitchen Poor social situation   . Sickle cell trait [facility])     Past Surgical History:  Procedure Laterality Date  . HERNIA REPAIR      Family History  Problem Relation Age of Onset  . Heart disease Mother   . Diabetes Mother   . Sickle cell anemia Mother   . Cancer Father        colon    Social History   Socioeconomic History  . Marital status: Legally Separated    Spouse name: Not on file  . Number of children: Not on file  . Years of education: Not on file  . Highest education level: Not on file  Occupational History  . Not on file  Social Needs  . Financial resource strain: Not on file  . Food insecurity    Worry: Not on file    Inability: Not on file  . Transportation needs    Medical: Not on file    Non-medical: Not on file  Tobacco Use  . Smoking status: Current Every Day Smoker    Packs/day: 0.25    Types: Cigarettes  . Smokeless tobacco: Never Used  Substance and Sexual Activity  . Alcohol use: No  . Drug use: No  . Sexual activity: Yes  Lifestyle  . Physical activity    Days per week: Not on file    Minutes per session: Not on file  . Stress: Not on file  Relationships  . Social Herbalist on phone: Not on file    Gets together: Not on file    Attends religious service: Not on file    Active member of club or organization: Not on file    Attends meetings of clubs or organizations: Not on file    Relationship status: Not on file  Other Topics Concern  . Not on file  Social History Narrative  . Not on file      Observations/Objective: Awake, alert and oriented x 3   Review of Systems  Constitutional: Negative for fever, malaise/fatigue and weight loss.  HENT: Negative.  Negative for nosebleeds.   Eyes: Negative.  Negative for blurred vision, double vision and photophobia.  Respiratory: Negative.  Negative for cough, shortness of breath and wheezing.   Cardiovascular: Positive for palpitations (intermittent). Negative for chest pain and leg swelling.  Gastrointestinal: Negative.  Negative for heartburn, nausea and vomiting.  Musculoskeletal: Negative.  Negative for myalgias.  Neurological: Negative.  Negative for dizziness, focal weakness, seizures and headaches.  Psychiatric/Behavioral: Negative.  Negative for suicidal ideas.    Assessment and Plan: Diagnoses and  all orders for this visit:  Poorly controlled type 2 diabetes mellitus with peripheral neuropathy ([place]) Diabetes is poorly controlled. Advised patient to keep a fasting blood sugar log fast, 2 hours post lunch and bedtime which will be reviewed at the next office visit.  Continue blood sugar control as discussed in office today, low carbohydrate diet, and regular physical exercise as tolerated, 150 minutes per week (30 min each day, 5 days per week, or 50 min 3 days per week). Keep blood sugar logs with fasting goal of 90-130 mg/dl, post prandial (after you eat) less than 180.  For Hypoglycemia: BS <60 and Hyperglycemia BS >400; contact the clinic ASAP. Annual eye exams and foot exams are recommended.   Essential hypertension Continue all antihypertensives as prescribed.  Remember to bring in your blood pressure log with you for your follow up appointment.  DASH/Mediterranean Diets are healthier choices for HTN.    Mixed hyperlipidemia INSTRUCTIONS: Work on a low fat, heart healthy diet and participate in regular aerobic exercise program by working out at least 150 minutes per week; 5 days a week-30 minutes per day. Avoid red  meat, fried foods. junk foods, sodas, sugary drinks, unhealthy snacking, alcohol and smoking.  Drink at least 48oz of water per day and monitor your carbohydrate intake daily.     Follow Up Instructions Return in about 4 weeks (around 05/04/2019).     I discussed the assessment and treatment plan with the patient. The patient was provided an opportunity to ask questions and all were answered. The patient agreed with the plan and demonstrated an understanding of the instructions.   The patient was advised to call back or seek an in-person evaluation if the symptoms worsen or if the condition fails to improve as anticipated.  I provided 24 minutes of non-face-to-face time during this encounter including median intraservice time, reviewing previous notes, labs, imaging, medications and explaining diagnosis and management.  Gildardo Pounds, FNP-BC

## 2019-04-07 ENCOUNTER — Other Ambulatory Visit: Payer: Self-pay

## 2019-04-07 ENCOUNTER — Ambulatory Visit: Payer: Self-pay | Attending: Family Medicine

## 2019-04-07 ENCOUNTER — Other Ambulatory Visit: Payer: Self-pay | Admitting: Nurse Practitioner

## 2019-04-07 DIAGNOSIS — E782 Mixed hyperlipidemia: Secondary | ICD-10-CM

## 2019-04-07 DIAGNOSIS — I10 Essential (primary) hypertension: Secondary | ICD-10-CM

## 2019-04-07 DIAGNOSIS — E1110 Type 2 diabetes mellitus with ketoacidosis without coma: Secondary | ICD-10-CM

## 2019-04-08 LAB — CBC
Hematocrit: 40 % (ref 34.0–46.6)
Hemoglobin: 13.5 g/dL (ref 11.1–15.9)
MCH: 29.9 pg (ref 26.6–33.0)
MCHC: 33.8 g/dL (ref 31.5–35.7)
MCV: 89 fL (ref 79–97)
Platelets: 223 10*3/uL (ref 150–450)
RBC: 4.51 x10E6/uL (ref 3.77–5.28)
RDW: 12.2 % (ref 11.7–15.4)
WBC: 9.5 10*3/uL (ref 3.4–10.8)

## 2019-04-08 LAB — CMP14+EGFR
ALT: 13 IU/L (ref 0–32)
AST: 13 IU/L (ref 0–40)
Albumin/Globulin Ratio: 1.5 (ref 1.2–2.2)
Albumin: 3.9 g/dL (ref 3.8–4.9)
Alkaline Phosphatase: 43 IU/L (ref 39–117)
BUN/Creatinine Ratio: 11 (ref 9–23)
BUN: 8 mg/dL (ref 6–24)
Bilirubin Total: <0.2 mg/dL (ref 0.0–1.2)
CO2: 22 mmol/L (ref 20–29)
Calcium: 9.5 mg/dL (ref 8.7–10.2)
Chloride: 98 mmol/L (ref 96–106)
Creatinine, Ser: 0.75 mg/dL (ref 0.57–1.00)
GFR calc Af Amer: 103 mL/min/{1.73_m2} (ref >59)
GFR calc non Af Amer: 89 mL/min/{1.73_m2} (ref >59)
Globulin, Total: 2.6 g/dL (ref 1.5–4.5)
Glucose: 438 mg/dL — ABNORMAL HIGH (ref 65–99)
Potassium: 4 mmol/L (ref 3.5–5.2)
Sodium: 137 mmol/L (ref 134–144)
Total Protein: 6.5 g/dL (ref 6.0–8.5)

## 2019-04-08 LAB — LIPID PANEL
Chol/HDL Ratio: 2.8 ratio (ref 0.0–4.4)
Cholesterol, Total: 136 mg/dL (ref 100–199)
HDL: 48 mg/dL (ref >39)
LDL Calculated: 62 mg/dL (ref 0–99)
Triglycerides: 129 mg/dL (ref 0–149)
VLDL Cholesterol Cal: 26 mg/dL (ref 5–40)

## 2019-04-08 LAB — HEMOGLOBIN A1C
Est. average glucose Bld gHb Est-mCnc: 306 mg/dL
Hgb A1c MFr Bld: 12.3 % — ABNORMAL HIGH (ref 4.8–5.6)

## 2019-04-12 ENCOUNTER — Encounter: Payer: Self-pay | Admitting: Nurse Practitioner

## 2019-04-12 MED ORDER — BD PEN NEEDLE MINI U/F 31G X 5 MM MISC: 3 refills | Status: DC

## 2019-04-12 MED ORDER — INSULIN DETEMIR 100 UNIT/ML FLEXPEN: 40.0000 [IU] | PEN_INJECTOR | Freq: Every day | SUBCUTANEOUS | 6 refills | Status: DC

## 2019-04-12 MED ORDER — GABAPENTIN 300 MG PO CAPS: 300.0000 mg | ORAL_CAPSULE | Freq: Three times a day (TID) | ORAL | 3 refills | Status: DC

## 2019-04-12 MED ORDER — ATORVASTATIN CALCIUM 40 MG PO TABS: 40.0000 mg | ORAL_TABLET | Freq: Every day | ORAL | 2 refills | Status: DC

## 2019-04-12 MED ORDER — DILTIAZEM HCL ER COATED BEADS 120 MG PO CP24: 120.0000 mg | ORAL_CAPSULE | Freq: Every day | ORAL | 3 refills | Status: DC

## 2019-04-12 MED ORDER — INSULIN SYRINGES (DISPOSABLE) U-100 0.3 ML MISC: 3 refills | Status: DC

## 2019-04-12 MED ORDER — INSULIN ASPART 100 UNIT/ML ~~LOC~~ SOLN: 10.0000 [IU] | Freq: Three times a day (TID) | SUBCUTANEOUS | 3 refills | Status: DC

## 2019-04-12 MED ORDER — OMEPRAZOLE 20 MG PO CPDR: 20.0000 mg | DELAYED_RELEASE_CAPSULE | Freq: Two times a day (BID) | ORAL | 3 refills | Status: DC

## 2019-04-12 MED ORDER — ASPIRIN EC 81 MG PO TBEC: 81.0000 mg | DELAYED_RELEASE_TABLET | Freq: Every day | ORAL | 2 refills | Status: DC

## 2019-04-13 MED FILL — GABAPENTIN 300 MG CAPSULE: 300 | 30 days supply | Qty: 90 | Fill #0

## 2019-04-13 MED FILL — DILTIAZEM 24HR ER 120 MG CA: 120 | 30 days supply | Qty: 30 | Fill #0

## 2019-04-13 MED FILL — ?ATORVASTATIN 20 MG TABLET: 20 | 30 days supply | Qty: 60 | Fill #0

## 2019-04-13 MED FILL — ?OMEPRAZOLE 20MG CAP DR: 20 | 30 days supply | Qty: 60 | Fill #0

## 2019-04-13 MED FILL — ?HUMALOG 100 UNITS/ML VIAL: 100 | 28 days supply | Qty: 10 | Fill #0

## 2019-04-15 ENCOUNTER — Telehealth: Payer: Self-pay

## 2019-04-15 NOTE — Telephone Encounter (Signed)
Pt returned call to [facility] for lab results. Call pt [phone].

## 2019-04-15 NOTE — Telephone Encounter (Signed)
CMA called patient back and informed on lab results.  Pt. Understood.

## 2019-04-17 ENCOUNTER — Other Ambulatory Visit: Payer: Self-pay | Admitting: Nurse Practitioner

## 2019-04-17 DIAGNOSIS — Z8679 Personal history of other diseases of the circulatory system: Secondary | ICD-10-CM

## 2019-04-17 MED ORDER — DABIGATRAN ETEXILATE MESYLATE 150 MG PO CAPS: 150.0000 mg | ORAL_CAPSULE | Freq: Two times a day (BID) | ORAL | 1 refills | Status: DC

## 2019-04-17 MED FILL — PRADAXA 150 MG CAP: 150 | 30 days supply | Qty: 60 | Fill #0

## 2019-04-22 NOTE — Progress Notes (Signed)
Contacted pt to go over [employer] [facility] pt doesn't have any questions or concerns

## 2019-05-05 ENCOUNTER — Ambulatory Visit: Payer: Self-pay | Admitting: Nurse Practitioner

## 2019-05-15 MED FILL — PRADAXA 150 MG CAP: 150 | 30 days supply | Qty: 60 | Fill #0

## 2019-05-25 ENCOUNTER — Other Ambulatory Visit: Payer: Self-pay

## 2019-05-25 ENCOUNTER — Ambulatory Visit: Payer: Self-pay | Attending: Family Medicine

## 2019-05-25 MED FILL — ?OMEPRAZOLE 20MG CAP DR: 20 | 30 days supply | Qty: 60 | Fill #1

## 2019-05-25 MED FILL — DILTIAZEM 24HR ER 120 MG CA: 120 | 30 days supply | Qty: 30 | Fill #1

## 2019-05-25 MED FILL — ?ATORVASTATIN 20 MG TABLET: 20 | 30 days supply | Qty: 60 | Fill #1

## 2019-05-25 MED FILL — ?HUMALOG 100 UNITS/ML VIAL: 100 | 28 days supply | Qty: 10 | Fill #1

## 2019-06-15 ENCOUNTER — Ambulatory Visit: Payer: Self-pay | Attending: Nurse Practitioner | Admitting: Nurse Practitioner

## 2019-06-15 ENCOUNTER — Encounter: Payer: Self-pay | Admitting: Nurse Practitioner

## 2019-06-15 ENCOUNTER — Other Ambulatory Visit: Payer: Self-pay

## 2019-06-15 VITALS — BP 132/73 | HR 98 | Temp 97.9°F | Ht 63.0 in | Wt 188.0 lb

## 2019-06-15 DIAGNOSIS — E669 Obesity, unspecified: Secondary | ICD-10-CM

## 2019-06-15 DIAGNOSIS — Z1211 Encounter for screening for malignant neoplasm of colon: Secondary | ICD-10-CM

## 2019-06-15 DIAGNOSIS — Z1159 Encounter for screening for other viral diseases: Secondary | ICD-10-CM

## 2019-06-15 DIAGNOSIS — E782 Mixed hyperlipidemia: Secondary | ICD-10-CM

## 2019-06-15 DIAGNOSIS — Z114 Encounter for screening for human immunodeficiency virus [HIV]: Secondary | ICD-10-CM

## 2019-06-15 DIAGNOSIS — E1165 Type 2 diabetes mellitus with hyperglycemia: Secondary | ICD-10-CM

## 2019-06-15 DIAGNOSIS — E1142 Type 2 diabetes mellitus with diabetic polyneuropathy: Secondary | ICD-10-CM

## 2019-06-15 DIAGNOSIS — I10 Essential (primary) hypertension: Secondary | ICD-10-CM

## 2019-06-15 DIAGNOSIS — F17210 Nicotine dependence, cigarettes, uncomplicated: Secondary | ICD-10-CM

## 2019-06-15 DIAGNOSIS — I480 Paroxysmal atrial fibrillation: Secondary | ICD-10-CM

## 2019-06-15 DIAGNOSIS — F17200 Nicotine dependence, unspecified, uncomplicated: Secondary | ICD-10-CM

## 2019-06-15 DIAGNOSIS — I4891 Unspecified atrial fibrillation: Secondary | ICD-10-CM

## 2019-06-15 LAB — GLUCOSE, POCT (MANUAL RESULT ENTRY)
POC Glucose: 408 mg/dl — AB (ref 70–99)
POC Glucose: 350 mg/dl — AB (ref 70–99)

## 2019-06-15 LAB — POCT URINALYSIS DIP (CLINITEK)
Bilirubin, UA: NEGATIVE
Blood, UA: NEGATIVE
Glucose, UA: >=1000 mg/dL — AB
Ketones, POC UA: NEGATIVE mg/dL
Leukocytes, UA: NEGATIVE
Nitrite, UA: NEGATIVE
POC PROTEIN,UA: NEGATIVE
Spec Grav, UA: <=1.005 — AB (ref 1.010–1.025)
Urobilinogen, UA: 0.2 E.U./dL
pH, UA: 5 (ref 5.0–8.0)

## 2019-06-15 MED ORDER — INSULIN ASPART 100 UNIT/ML ~~LOC~~ SOLN
20.0000 [IU] | Freq: Once | SUBCUTANEOUS | Status: AC
  Administered 2019-06-15: 11:00:00 20 [IU] via SUBCUTANEOUS

## 2019-06-15 MED ORDER — INSULIN LISPRO 100 UNIT/ML ~~LOC~~ SOLN: 10.0000 [IU] | Freq: Three times a day (TID) | SUBCUTANEOUS | 1 refills | Status: DC

## 2019-06-15 MED ORDER — INSULIN DETEMIR 100 UNIT/ML FLEXPEN: 40.0000 [IU] | PEN_INJECTOR | Freq: Every day | SUBCUTANEOUS | 6 refills | Status: DC

## 2019-06-15 MED ORDER — ASPIRIN EC 325 MG PO TBEC: 325.0000 mg | DELAYED_RELEASE_TABLET | Freq: Every day | ORAL | 2 refills | Status: AC

## 2019-06-15 MED FILL — !LEVEMIR FLEXPEN 100UNITS/M: 100U/ML (3) | 30 days supply | Qty: 12 | Fill #0

## 2019-06-15 MED FILL — ?HUMALOG 100 UNITS/ML VIAL: 100 | 30 days supply | Qty: 10 | Fill #0

## 2019-06-15 NOTE — Progress Notes (Signed)
Assessment & Plan:  Anna Golden was seen today for follow-up.  Diagnoses and all orders for this visit:  Poorly controlled type 2 diabetes mellitus with peripheral neuropathy (HCC) -     Glucose (CBG) -     Microalbumin/Creatinine Ratio, Urine -     Ambulatory referral to Ophthalmology -     Hemoglobin A1c; Future -     Insulin Detemir (LEVEMIR) 100 UNIT/ML Pen; Inject 40 Units into the skin at bedtime. -     Hemoglobin A1c -     Glucose (CBG)  Tobacco dependence Anna Golden was counseled on the dangers of tobacco use, and was advised to quit. Reviewed strategies to maximize success, including removing cigarettes and smoking materials from environment, stress management and support of family/friends as well as pharmacological alternatives including: Wellbutrin, Chantix, Nicotine patch, Nicotine gum or lozenges. Smoking cessation support: smoking cessation hotline: 1-800-QUIT-NOW.  Smoking cessation classes are also available through [facility]. Call [phone] or visit our website at [email or web address]   A total of 3 minutes was spent on counseling for smoking cessation and [name] is not ready to quit.   Obesity (BMI 30-39.9) Discussed diet and exercise for person with BMI >30. Instructed: burn more calories than you eat. Losing 5 percent of your body weight should be considered a success. In the longer term, losing more than 15 percent of your body weight and staying at this weight is an extremely good result.  Will recheck weight in 3-6 months.  Essential hypertension Continue all antihypertensives as prescribed.  Remember to bring in your blood pressure log with you for your follow up appointment.  DASH/Mediterranean Diets are healthier choices for HTN.    Mixed hyperlipidemia INSTRUCTIONS: Work on a low fat, heart healthy diet and participate in regular aerobic exercise program by working out at least 150 minutes per week; 5 days a week-30 minutes per  day. Avoid red meat, fried foods. junk foods, sodas, sugary drinks, unhealthy snacking, alcohol and smoking.  Drink at least 48oz of water per day and monitor your carbohydrate intake daily.    Colon cancer screening -     Fecal occult blood, imunochemical([facility])  Need for hepatitis C screening test -     Hepatitis C Antibody; Future -     Hepatitis C Antibody  Encounter for screening for HIV -     HIV antibody (with reflex); Future -     HIV antibody (with reflex)  PAF (paroxysmal atrial fibrillation) (HCC) -     aspirin EC 325 MG tablet; Take 1 tablet (325 mg total) by mouth daily. Dc'd Pradaxa. Will await cardiology recommendations regarding anticoagulant. EKG NSR    Patient has been counseled on age-appropriate routine health concerns for screening and prevention. These are reviewed and up-to-date. Referrals have been placed accordingly. Immunizations are up-to-date or declined.    Subjective:   Chief Complaint  Patient presents with  . Follow-up    Pt. is here for a follow up.    HPI Anna Golden 58 y.o. female presents to office today for follow up.  has a past medical history of Arthritis, Asthma, Diabetes mellitus, Fatty liver disease, nonalcoholic, Hypertension, Hypokalemia, Leukocytosis, Obesity, PAF (paroxysmal atrial fibrillation) ([place]), Poor social situation, and Sickle cell trait ([place]).    She has an appointment with Cardiology on 06-19-2019. I started her on ASA 325 mg today. I had initially refilled pradaxa however based on review of office notes from her Cardiologist several years ago I  started her on ASA 325 mg today. She states she was started on pradaxa when [place] however I do not have previous records of her being on this medication.  Maint NSR Anticoagulation not feasible due to living in shelter and cost.  Ok to Rx ASA in NSR.  No beta blocker due to asthma  Refill for cardizem called in.   *EKG NSR today in office*  DM TYPE 2  Poorly controlled. She states "they didn't give me levemir" when she picked up her medications in August. Also she has not been giving herself Humalog as prescribed. States she hates needles and sometimes will wait on her sons to administer her injections. Sometimes misses 1-2 doses a day. Glucose in the office today 400s requiring novolog 20 units. She is not diet or exercise compliant. We had a long discussion today regarding diabetic complications and dietary modifications. I am not sure how medication compliant she will be as she has a strong aversion to SQ injections. Hyperglycemic complications include: blurred vision and peripheral neuropathy. Could not obtain urine for microalbumin today.  Lab Results  Component Value Date   HGBA1C 12.3 (H) 04/07/2019   LDL at goal <70. She denies statin intolerance. Taking atorvastatin 40 mg daily as prescribed.  Lab Results  Component Value Date   LDLCALC 62 04/07/2019    Blood pressure at goal. Denies chest pain, shortness of breath, palpitations, lightheadedness, dizziness, headaches or BLE edema. Taking Cardizem mostly for rate control.  BP Readings from Last 3 Encounters:  06/15/19 132/73  03/20/19 (!) 160/90  02/16/15 120/80   Review of Systems  Constitutional: Negative for fever, malaise/fatigue and weight loss.  HENT: Negative.  Negative for nosebleeds.   Eyes: Positive for blurred vision. Negative for double vision, photophobia, pain, discharge and redness.  Respiratory: Negative.  Negative for cough and shortness of breath.   Cardiovascular: Negative.  Negative for chest pain, palpitations and leg swelling.  Gastrointestinal: Negative.  Negative for heartburn, nausea and vomiting.  Musculoskeletal: Negative.  Negative for myalgias.  Neurological: Positive for sensory change. Negative for dizziness, focal weakness, seizures and headaches.  Psychiatric/Behavioral: Negative.  Negative for suicidal ideas.    Past Medical History:   Diagnosis Date  . Arthritis   . Asthma   . Diabetes mellitus   . Fatty liver disease, nonalcoholic   . Hypertension   . Hypokalemia   . Leukocytosis   . Obesity   . PAF (paroxysmal atrial fibrillation) ([place])    a. Dx 03/2013, spont converted to NSR.  Marland Kitchen Poor social situation   . Sickle cell trait [facility])     Past Surgical History:  Procedure Laterality Date  . HERNIA REPAIR      Family History  Problem Relation Age of Onset  . Heart disease Mother   . Diabetes Mother   . Sickle cell anemia Mother   . Cancer Father        colon    Social History Reviewed with no changes to be made today.   Outpatient Medications Prior to Visit  Medication Sig Dispense Refill  . atorvastatin (LIPITOR) 40 MG tablet Take 1 tablet (40 mg total) by mouth daily. 90 tablet 2  . Calcium Carbonate-Vitamin D (CALCIUM 500 + D PO) Take by mouth.    . diltiazem (CARDIZEM CD) 120 MG 24 hr capsule Take 1 capsule (120 mg total) by mouth daily. 90 capsule 3  . Insulin Pen Needle (B-D UF III MINI PEN NEEDLES) 31G  X 5 MM MISC Use as instructed. Inject into the skin four times per day. 100 each 3  . Insulin Syringes, Disposable, U-100 0.3 ML MISC Use as instructed. Inject into the skin once three times per day. 100 each 3  . gabapentin (NEURONTIN) 300 MG capsule Take 1 capsule (300 mg total) by mouth 3 (three) times daily. 90 capsule 3  . insulin aspart (NOVOLOG) 100 UNIT/ML injection Inject 10 Units into the skin 3 (three) times daily with meals. (Patient not taking: Reported on 06/15/2019) 30 mL 3  . omeprazole (PRILOSEC) 20 MG capsule Take 1 capsule (20 mg total) by mouth 2 (two) times daily before a meal. 60 capsule 3  . aspirin EC 81 MG tablet Take 1 tablet (81 mg total) by mouth daily. (Patient not taking: Reported on 06/15/2019) 90 tablet 2  . dabigatran (PRADAXA) 150 MG CAPS capsule Take 1 capsule (150 mg total) by mouth 2 (two) times daily. 60 capsule 1  . Insulin Detemir (LEVEMIR) 100 UNIT/ML Pen Inject  40 Units into the skin at bedtime. 15 mL 6   No facility-administered medications prior to visit.     Allergies  Allergen Reactions  . Metoprolol Other (See Comments)    "Patient slept for whole day after starting medication this week.  Patient does not want to take this med.  Not sure if med causes hypotension, but could not get out of bed for any reason"  . Other Other (See Comments)    ANESTHESIA-CAUSES CARDIAC ARREST AND LUNGS COLLAPSED IN 1988, 2008.    . Latex Itching    Swell up  . Pork-Derived Products Palpitations and Other (See Comments)    Also raises Blood pressure-Patient avoids pork and pork products       Objective:    BP 132/73 (BP Location: Left Arm, Patient Position: Sitting, Cuff Size: Normal)   Pulse 98   Temp 97.9 F (36.6 C) (Oral)   Ht 5\' 3"  (1.6 m)   Wt 188 lb (85.3 kg)   LMP 12/20/2014   SpO2 98%   BMI 33.30 kg/m  Wt Readings from Last 3 Encounters:  06/15/19 188 lb (85.3 kg)  12/22/14 189 lb (85.7 kg)  05/24/14 231 lb 12.8 oz (105.1 kg)    Physical Exam Vitals signs and nursing note reviewed.  Constitutional:      Appearance: She is well-developed.  HENT:     Head: Normocephalic and atraumatic.  Neck:     Musculoskeletal: Normal range of motion.  Cardiovascular:     Rate and Rhythm: Normal rate and regular rhythm.     Heart sounds: Normal heart sounds. No murmur. No friction rub. No gallop.   Pulmonary:     Effort: Pulmonary effort is normal. No tachypnea or respiratory distress.     Breath sounds: Normal breath sounds. No decreased breath sounds, wheezing, rhonchi or rales.  Chest:     Chest wall: No tenderness.  Abdominal:     General: Bowel sounds are normal.     Palpations: Abdomen is soft.  Musculoskeletal: Normal range of motion.  Skin:    General: Skin is warm and dry.  Neurological:     Mental Status: She is alert and oriented to person, place, and time.     Coordination: Coordination normal.  Psychiatric:         Behavior: Behavior normal. Behavior is cooperative.        Thought Content: Thought content normal.        Judgment: Judgment  normal.          Patient has been counseled extensively about nutrition and exercise as well as the importance of adherence with medications and regular follow-up. The patient was given clear instructions to go to ER or return to medical center if symptoms don't improve, worsen or new problems develop. The patient verbalized understanding.   Follow-up: Return in 5 weeks (on 07/21/2019) for A1C.   Gildardo Pounds, FNP-BC [facility] [place], [place]   06/15/2019, 12:38 PM

## 2019-06-15 NOTE — Addendum Note (Signed)
Addended byMariane Baumgarten on: 06/15/2019 02:33 PM   Modules accepted: Orders

## 2019-06-16 LAB — MICROALBUMIN / CREATININE URINE RATIO
Creatinine, Urine: 43.1 mg/dL
Microalb/Creat Ratio: 10 mg/g creat (ref 0–29)
Microalbumin, Urine: 4.1 ug/mL

## 2019-06-16 NOTE — Progress Notes (Deleted)
CARDIOLOGY CONSULT NOTE       Patient ID: Anna Golden MRN: [id number] DOB/AGE: [birth year] 58 y.o.  Admit date: (Not on file) Referring Physician: Raul Del Primary Physician: Gildardo Pounds, NP Primary Cardiologist: New Reason for Consultation: PAF  Active Problems:   * No active hospital problems. *   HPI:  58 y.o. poorly controlled diabetic with HLD, HTN , Obesity and smoking Distant history of PAF Primarily Rx with ASA as she was homeless and living in shelter and could not afford DOAC or have f/u INR;s on coumadin Has been noted to be in NSR. No beta blockers due to asthma   This patients CHA2DS2-VASc Score and unadjusted Ischemic Stroke Rate (% per year) is equal to 3.2 % stroke rate/year from a score of 3  Above score calculated as 1 point each if present [CHF, HTN, DM, Vascular=MI/PAD/Aortic Plaque, Age if 65-74, or Female] Above score calculated as 2 points each if present [Age > 75, or Stroke/TIA/TE]  2014 seen by Dr Domenic Polite with rapid PAF spontaneous conversion with cardizem. Echo with normal EF Had normal stress test in 2014   ***  ROS All other systems reviewed and negative except as noted above  Past Medical History:  Diagnosis Date  . Arthritis   . Asthma   . Diabetes mellitus   . Fatty liver disease, nonalcoholic   . Hypertension   . Hypokalemia   . Leukocytosis   . Obesity   . PAF (paroxysmal atrial fibrillation) ([place])    a. Dx 03/2013, spont converted to NSR.  Marland Kitchen Poor social situation   . Sickle cell trait (HCC)     Family History  Problem Relation Age of Onset  . Heart disease Mother   . Diabetes Mother   . Sickle cell anemia Mother   . Cancer Father        colon    Social History   Socioeconomic History  . Marital status: Legally Separated    Spouse name: Not on file  . Number of children: Not on file  . Years of education: Not on file  . Highest education level: Not on file  Occupational History  . Not on file  Social Needs   . Financial resource strain: Not on file  . Food insecurity    Worry: Not on file    Inability: Not on file  . Transportation needs    Medical: Not on file    Non-medical: Not on file  Tobacco Use  . Smoking status: Current Every Day Smoker    Packs/day: 0.25    Types: Cigarettes  . Smokeless tobacco: Never Used  Substance and Sexual Activity  . Alcohol use: No  . Drug use: No  . Sexual activity: Yes  Lifestyle  . Physical activity    Days per week: Not on file    Minutes per session: Not on file  . Stress: Not on file  Relationships  . Social Herbalist on phone: Not on file    Gets together: Not on file    Attends religious service: Not on file    Active member of club or organization: Not on file    Attends meetings of clubs or organizations: Not on file    Relationship status: Not on file  . Intimate partner violence    Fear of current or ex partner: Not on file    Emotionally abused: Not on file    Physically abused: Not on file  Forced sexual activity: Not on file  Other Topics Concern  . Not on file  Social History Narrative  . Not on file    Past Surgical History:  Procedure Laterality Date  . HERNIA REPAIR          Physical Exam: Last menstrual period 12/20/2014.    Affect appropriate Overweight black female  HEENT: normal Neck supple with no adenopathy JVP normal no bruits no thyromegaly Lungs clear with no wheezing and good diaphragmatic motion Heart:  S1/S2 no murmur, no rub, gallop or click PMI normal Abdomen: benighn, BS positve, no tenderness, no AAA no bruit.  No HSM or HJR Distal pulses intact with no bruits No edema Neuro non-focal Skin warm and dry No muscular weakness   Labs:   Lab Results  Component Value Date   WBC 9.5 04/07/2019   HGB 13.5 04/07/2019   HCT 40.0 04/07/2019   MCV 89 04/07/2019   PLT 223 04/07/2019   No results for input(s): NA, K, CL, CO2, BUN, CREATININE, CALCIUM, PROT, BILITOT, ALKPHOS,  ALT, AST, GLUCOSE in the last 168 hours.  Invalid input(s): LABALBU Lab Results  Component Value Date   CKTOTAL 74 10/02/2011   CKMB 1.5 10/02/2011   TROPONINI <0.30 03/07/2014    Lab Results  Component Value Date   CHOL 136 04/07/2019   CHOL 105 04/08/2014   CHOL 111 04/25/2013   Lab Results  Component Value Date   HDL 48 04/07/2019   HDL 42 04/08/2014   HDL 48 04/25/2013   Lab Results  Component Value Date   LDLCALC 62 04/07/2019   LDLCALC 45 04/08/2014   LDLCALC 43 04/25/2013   Lab Results  Component Value Date   TRIG 129 04/07/2019   TRIG 92 04/08/2014   TRIG 99 04/25/2013   Lab Results  Component Value Date   CHOLHDL 2.8 04/07/2019   CHOLHDL 2.5 04/08/2014   CHOLHDL 2.3 04/25/2013   No results found for: LDLDIRECT    Radiology: No results found.  EKG: SR rate 88 normal 01/15/15    ASSESSMENT AND PLAN:   1. PAF:  No documented recurrence over 6 years *** 2. HTN:  Well controlled.  Continue current medications and low sodium Dash type diet.   3. HLD continue statin LDL at goal 4. DM:  Discussed low carb diet.  Target hemoglobin A1c is 6.5 or less.  Continue current medications. 5. GERD:  Continue prilosec low carb diet   Signed: Jenkins Rouge 06/16/2019, 9:32 AM

## 2019-06-19 ENCOUNTER — Ambulatory Visit: Payer: Self-pay | Admitting: Cardiovascular Disease

## 2019-06-27 LAB — FECAL OCCULT BLOOD, IMMUNOCHEMICAL: Fecal Occult Bld: NEGATIVE

## 2019-07-13 ENCOUNTER — Other Ambulatory Visit (HOSPITAL_COMMUNITY): Payer: Self-pay | Admitting: *Deleted

## 2019-07-13 ENCOUNTER — Telehealth (HOSPITAL_COMMUNITY): Payer: Self-pay

## 2019-07-13 DIAGNOSIS — Z1231 Encounter for screening mammogram for malignant neoplasm of breast: Secondary | ICD-10-CM

## 2019-07-13 NOTE — Telephone Encounter (Signed)
Telephoned patient at home number. Left a message to call and schedule appointment with [facility].

## 2019-07-21 ENCOUNTER — Ambulatory Visit: Payer: Self-pay | Attending: Nurse Practitioner

## 2019-07-21 ENCOUNTER — Other Ambulatory Visit: Payer: Self-pay

## 2019-07-22 ENCOUNTER — Other Ambulatory Visit: Payer: Self-pay | Admitting: Nurse Practitioner

## 2019-07-22 DIAGNOSIS — R768 Other specified abnormal immunological findings in serum: Secondary | ICD-10-CM

## 2019-07-22 LAB — HEPATITIS C ANTIBODY: Hep C Virus Ab: 2.9 s/co ratio — ABNORMAL HIGH (ref 0.0–0.9)

## 2019-07-22 LAB — HIV ANTIBODY (ROUTINE TESTING W REFLEX): HIV Screen 4th Generation wRfx: NONREACTIVE

## 2019-07-28 ENCOUNTER — Telehealth: Payer: Self-pay

## 2019-07-28 ENCOUNTER — Encounter: Payer: Self-pay | Admitting: Nurse Practitioner

## 2019-07-28 ENCOUNTER — Ambulatory Visit: Payer: Self-pay | Attending: Nurse Practitioner | Admitting: Nurse Practitioner

## 2019-07-28 ENCOUNTER — Other Ambulatory Visit: Payer: Self-pay

## 2019-07-28 ENCOUNTER — Other Ambulatory Visit: Payer: Self-pay | Admitting: Nurse Practitioner

## 2019-07-28 VITALS — BP 109/69 | HR 90 | Temp 98.9°F | Ht 63.0 in | Wt 190.0 lb

## 2019-07-28 DIAGNOSIS — I10 Essential (primary) hypertension: Secondary | ICD-10-CM

## 2019-07-28 DIAGNOSIS — I480 Paroxysmal atrial fibrillation: Secondary | ICD-10-CM

## 2019-07-28 DIAGNOSIS — E1142 Type 2 diabetes mellitus with diabetic polyneuropathy: Secondary | ICD-10-CM

## 2019-07-28 DIAGNOSIS — K219 Gastro-esophageal reflux disease without esophagitis: Secondary | ICD-10-CM

## 2019-07-28 DIAGNOSIS — R768 Other specified abnormal immunological findings in serum: Secondary | ICD-10-CM

## 2019-07-28 DIAGNOSIS — F17200 Nicotine dependence, unspecified, uncomplicated: Secondary | ICD-10-CM

## 2019-07-28 DIAGNOSIS — M810 Age-related osteoporosis without current pathological fracture: Secondary | ICD-10-CM

## 2019-07-28 DIAGNOSIS — E782 Mixed hyperlipidemia: Secondary | ICD-10-CM

## 2019-07-28 DIAGNOSIS — M2550 Pain in unspecified joint: Secondary | ICD-10-CM

## 2019-07-28 DIAGNOSIS — E1165 Type 2 diabetes mellitus with hyperglycemia: Secondary | ICD-10-CM

## 2019-07-28 LAB — POCT GLYCOSYLATED HEMOGLOBIN (HGB A1C): Hemoglobin A1C: 13.6 % — AB (ref 4.0–5.6)

## 2019-07-28 LAB — GLUCOSE, POCT (MANUAL RESULT ENTRY)
POC Glucose: 520 mg/dl — AB (ref 70–99)
POC Glucose: 444 mg/dl — AB (ref 70–99)

## 2019-07-28 MED ORDER — BASAGLAR KWIKPEN 100 UNIT/ML ~~LOC~~ SOPN: 40.0000 [IU] | PEN_INJECTOR | Freq: Every day | SUBCUTANEOUS | 1 refills | Status: DC

## 2019-07-28 MED ORDER — INSULIN ASPART 100 UNIT/ML ~~LOC~~ SOLN
20.0000 [IU] | Freq: Once | SUBCUTANEOUS | Status: AC
  Administered 2019-07-28: 15:00:00 20 [IU] via SUBCUTANEOUS

## 2019-07-28 MED ORDER — ATORVASTATIN CALCIUM 40 MG PO TABS: 40.0000 mg | ORAL_TABLET | Freq: Every day | ORAL | 2 refills | Status: DC

## 2019-07-28 MED ORDER — INSULIN SYRINGES (DISPOSABLE) U-100 0.3 ML MISC: 3 refills | Status: DC

## 2019-07-28 MED ORDER — LISINOPRIL 2.5 MG PO TABS: 2.5000 mg | ORAL_TABLET | Freq: Every day | ORAL | 0 refills | Status: DC

## 2019-07-28 MED ORDER — INSULIN LISPRO 100 UNIT/ML ~~LOC~~ SOLN: 10.0000 [IU] | Freq: Three times a day (TID) | SUBCUTANEOUS | 1 refills | Status: DC

## 2019-07-28 MED ORDER — OMEPRAZOLE 20 MG PO CPDR: 20.0000 mg | DELAYED_RELEASE_CAPSULE | Freq: Two times a day (BID) | ORAL | 3 refills | Status: DC

## 2019-07-28 MED ORDER — BD PEN NEEDLE MINI U/F 31G X 5 MM MISC: 3 refills | Status: DC

## 2019-07-28 MED ORDER — GABAPENTIN 300 MG PO CAPS: 300.0000 mg | ORAL_CAPSULE | Freq: Three times a day (TID) | ORAL | 3 refills | Status: DC

## 2019-07-28 MED ORDER — DILTIAZEM HCL ER COATED BEADS 120 MG PO CP24: 120.0000 mg | ORAL_CAPSULE | Freq: Every day | ORAL | 3 refills | Status: DC

## 2019-07-28 MED ORDER — INSULIN DETEMIR 100 UNIT/ML FLEXPEN: 40.0000 [IU] | PEN_INJECTOR | Freq: Two times a day (BID) | SUBCUTANEOUS | 6 refills | Status: DC

## 2019-07-28 MED FILL — ?OMEPRAZOLE 20MG CAP DR: 20 | 30 days supply | Qty: 60 | Fill #0

## 2019-07-28 MED FILL — GABAPENTIN 300 MG CAPSULE: 300 | 30 days supply | Qty: 90 | Fill #0

## 2019-07-28 MED FILL — LISINOPRIL 2.5 MG TABLET: 2.5 | 30 days supply | Qty: 30 | Fill #0

## 2019-07-28 MED FILL — TRUEPLUS PEN NDL 31GX5/16: 31G X 8 MM | 25 days supply | Qty: 100 | Fill #0

## 2019-07-28 MED FILL — DILTIAZEM 24HR ER 120 MG CA: 120 | 30 days supply | Qty: 30 | Fill #0

## 2019-07-28 MED FILL — ?HUMALOG 100 UNITS/ML VIAL: 100 | 33 days supply | Qty: 10 | Fill #0

## 2019-07-28 MED FILL — ?ATORVASTATIN 40MG TABLET: 40 | 30 days supply | Qty: 30 | Fill #0

## 2019-07-28 MED FILL — TRUEPLUS SYR 0.3ML 30GX5/16: 30G X 5/16" | 25 days supply | Qty: 100 | Fill #0

## 2019-07-28 NOTE — Telephone Encounter (Signed)
Sent. Thanks.   

## 2019-07-28 NOTE — Telephone Encounter (Signed)
Can we have a new script for Basaglar to replace levemir.  Pt qualifies for [facility]

## 2019-07-28 NOTE — Progress Notes (Signed)
Assessment & Plan:  Anna Golden was seen today for follow-up.  Diagnoses and all orders for this visit:  Poorly controlled type 2 diabetes mellitus with peripheral neuropathy (HCC) -     Glucose (CBG) -     HgB A1c -     insulin aspart (novoLOG) injection 20 Units -     gabapentin (NEURONTIN) 300 MG capsule; Take 1 capsule (300 mg total) by mouth 3 (three) times daily. -     Insulin Detemir (LEVEMIR) 100 UNIT/ML Pen; Inject 40 Units into the skin 2 (two) times daily. -     insulin lispro (HUMALOG) 100 UNIT/ML injection; Inject 0.1 mLs (10 Units total) into the skin 3 (three) times daily before meals. -     Insulin Pen Needle (B-D UF III MINI PEN NEEDLES) 31G X 5 MM MISC; Use as instructed. Inject into the skin four times per day. -     Insulin Syringes, Disposable, U-100 0.3 ML MISC; Use as instructed. Inject into the skin once three times per day. -     lisinopril (ZESTRIL) 2.5 MG tablet; Take 1 tablet (2.5 mg total) by mouth daily. -     Ambulatory referral to Cardiology -     Ambulatory referral to Ophthalmology Continue blood sugar control as discussed in office today, low carbohydrate diet, and regular physical exercise as tolerated, 150 minutes per week (30 min each day, 5 days per week, or 50 min 3 days per week). Keep blood sugar logs with fasting goal of 90-130 mg/dl, post prandial (after you eat) less than 180.  For Hypoglycemia: BS <60 and Hyperglycemia BS >400; contact the clinic ASAP. Annual eye exams and foot exams are recommended.   Mixed hyperlipidemia -     atorvastatin (LIPITOR) 40 MG tablet; Take 1 tablet (40 mg total) by mouth daily. INSTRUCTIONS: Work on a low fat, heart healthy diet and participate in regular aerobic exercise program by working out at least 150 minutes per week; 5 days a week-30 minutes per day. Avoid red meat/beef/steak,  fried foods. junk foods, sodas, sugary drinks, unhealthy snacking, alcohol and smoking.  Drink at least 80 oz of water per day and  monitor your carbohydrate intake daily.    Essential hypertension -     CMP14+EGFR; Future Continue all antihypertensives as prescribed.  Remember to bring in your blood pressure log with you for your follow up appointment.  DASH/Mediterranean Diets are healthier choices for HTN.    PAF (paroxysmal atrial fibrillation) (HCC) -     diltiazem (CARDIZEM CD) 120 MG 24 hr capsule; Take 1 capsule (120 mg total) by mouth daily. -     Ambulatory referral to Cardiology  Tobacco dependence Anna Golden was counseled on the dangers of tobacco use, and was advised to quit. Reviewed strategies to maximize success, including removing cigarettes and smoking materials from environment, stress management and support of family/friends as well as pharmacological alternatives including: Wellbutrin, Chantix, Nicotine patch, Nicotine gum or lozenges. Smoking cessation support: smoking cessation hotline: 1-800-QUIT-NOW.  Smoking cessation classes are also available through [facility] and [facility]. Call [phone] or visit our website at [email or web address]   A total of 3 minutes was spent on counseling for smoking cessation and Anna Golden is not ready to quit.   Arthralgia, unspecified joint -     Rheumatoid Arthritis Profile -     DG Bone Density; Future Work on losing weight to help reduce joint pain. May alternate with heat and ice application for pain relief.  May also alternate with acetaminophen and Ibuprofen as prescribed pain relief. Other alternatives include massage, acupuncture and water aerobics.  You must stay active and avoid a sedentary lifestyle. Osteoporosis, unspecified osteoporosis type, unspecified pathological fracture presence -     DG Bone Density; Future  GERD without esophagitis -     omeprazole (PRILOSEC) 20 MG capsule; Take 1 capsule (20 mg total) by mouth 2 (two) times daily before a meal. INSTRUCTIONS: Avoid GERD Triggers: acidic, spicy or fried foods, caffeine, coffee,  sodas,  alcohol and chocolate.    Patient has been counseled on age-appropriate routine health concerns for screening and prevention. These are reviewed and up-to-date. Referrals have been placed accordingly. Immunizations are up-to-date or declined.    Subjective:   Chief Complaint  Patient presents with   Follow-up    Pt. is here for her diabetes follow up. Pt. stated she feels like her heart is beating fast and aching feeling on her left arm.    HPI Anna Golden 58 y.o. female presents to office today for follow up. History of poor compliance with medications.  Mammogram scheduled for 08-13-2019. States she was told she had osteoporosis and was placed on calcium and vitamin D years ago. Will add bone density to mammogram that is already scheduled for 08-13-2019.  FIT test negative for 2020.    DM TYPE 2 No improvement with A1c. She is not administering insulin as prescribed. Dietary non adherence is a component as well. Blood glucose >500 here in office today. She endorses "heart racing and left arm discomfort".  Hyperglycemic symptoms include peripheral neuropathy. She is overdue for eye exam for diabetic retinopathy. Taking statin. States she does not like needles and has a hard time giving herself insulin. I explained to her that with her levels being so elevated it is highly unlikely that po diabetic medications would lower her A1c below 7. She states she will try to administer her insulin or have her brother do it. I explained the complications that can occur from poorly controlled DM including blindness, kidney failure, heart attack and stroke.  Lab Results  Component Value Date   HGBA1C 13.6 (A) 07/28/2019   Essential Hypertension Stable. She does not monitor her blood pressure at home. Current medications include: Cardizem 120 mg daily (which she is not taking as she ran out of this medication. Denies  lightheadedness, dizziness, headaches or BLE edema.  BP Readings from Last  3 Encounters:  07/28/19 109/69  06/15/19 132/73  03/20/19 (!) 160/90    Dyslipidemia LDL at goal >70. She has bee prescribed atorvastatin 40 mg but it looks like this medication has expired and has not been picked up in some type. She denies any statin intolerance.  Lab Results  Component Value Date   LDLCALC 62 04/07/2019   Review of Systems  Constitutional: Negative for fever, malaise/fatigue and weight loss.  HENT: Negative.  Negative for nosebleeds.   Eyes: Negative.  Negative for blurred vision, double vision and photophobia.  Respiratory: Negative.  Negative for cough and shortness of breath.   Cardiovascular: Positive for chest pain and palpitations. Negative for leg swelling.  Gastrointestinal: Positive for heartburn. Negative for nausea and vomiting.  Musculoskeletal: Positive for joint pain (states she was diagnosed with RA years ago and was referred to rheumatology but did not follow up with rheumatology). Negative for myalgias.  Neurological: Positive for sensory change (peripheral neuropathy). Negative for dizziness, focal weakness, seizures and headaches.  Psychiatric/Behavioral: Negative.  Negative for suicidal  ideas.    Past Medical History:  Diagnosis Date   Arthritis    Asthma    Diabetes mellitus    Fatty liver disease, nonalcoholic    Hypertension    Hypokalemia    Leukocytosis    Obesity    PAF (paroxysmal atrial fibrillation) ([place])    a. Dx 03/2013, spont converted to NSR.   Poor social situation    Sickle cell trait [facility])     Past Surgical History:  Procedure Laterality Date   HERNIA REPAIR      Family History  Problem Relation Age of Onset   Heart disease Mother    Diabetes Mother    Sickle cell anemia Mother    Cancer Father        colon    Social History Reviewed with no changes to be made today.   Outpatient Medications Prior to Visit  Medication Sig Dispense Refill   aspirin EC 325 MG tablet Take 1 tablet (325 mg  total) by mouth daily. 90 tablet 2   Calcium Carbonate-Vitamin D (CALCIUM 500 + D PO) Take by mouth.     insulin lispro (HUMALOG) 100 UNIT/ML injection Inject 0.1 mLs (10 Units total) into the skin 3 (three) times daily before meals. 30 mL 1   Insulin Pen Needle (B-D UF III MINI PEN NEEDLES) 31G X 5 MM MISC Use as instructed. Inject into the skin four times per day. 100 each 3   Insulin Syringes, Disposable, U-100 0.3 ML MISC Use as instructed. Inject into the skin once three times per day. 100 each 3   atorvastatin (LIPITOR) 40 MG tablet Take 1 tablet (40 mg total) by mouth daily. 90 tablet 2   diltiazem (CARDIZEM CD) 120 MG 24 hr capsule Take 1 capsule (120 mg total) by mouth daily. 90 capsule 3   gabapentin (NEURONTIN) 300 MG capsule Take 1 capsule (300 mg total) by mouth 3 (three) times daily. 90 capsule 3   Insulin Detemir (LEVEMIR) 100 UNIT/ML Pen Inject 40 Units into the skin at bedtime. 15 mL 6   omeprazole (PRILOSEC) 20 MG capsule Take 1 capsule (20 mg total) by mouth 2 (two) times daily before a meal. 60 capsule 3   No facility-administered medications prior to visit.     Allergies  Allergen Reactions   Metoprolol Other (See Comments)    "Patient slept for whole day after starting medication this week.  Patient does not want to take this med.  Not sure if med causes hypotension, but could not get out of bed for any reason"   Other Other (See Comments)    ANESTHESIA-CAUSES [place], 2008.     Latex Itching    Swell up   Pork-Derived Products Palpitations and Other (See Comments)    Also raises Blood pressure-Patient avoids pork and pork products       Objective:    BP 109/69 (BP Location: Right Arm, Patient Position: Sitting, Cuff Size: Large)    Pulse 90    Temp 98.9 F (37.2 C) (Oral)    Ht _0  (1.6 m)    Wt 190 lb (86.2 kg)    LMP 12/20/2014    SpO2 97%    BMI 33.66 kg/m  Wt Readings from Last 3 Encounters:  07/28/19 190 lb  (86.2 kg)  06/15/19 188 lb (85.3 kg)  12/22/14 189 lb (85.7 kg)    Physical Exam Vitals signs and nursing note reviewed.  Constitutional:  Appearance: She is well-developed.  HENT:     Head: Normocephalic and atraumatic.  Neck:     Musculoskeletal: Normal range of motion.  Cardiovascular:     Rate and Rhythm: Normal rate and regular rhythm.     Heart sounds: Normal heart sounds. No murmur. No friction rub. No gallop.   Pulmonary:     Effort: Pulmonary effort is normal. No tachypnea or respiratory distress.     Breath sounds: Normal breath sounds. No decreased breath sounds, wheezing, rhonchi or rales.  Chest:     Chest wall: No tenderness.  Abdominal:     General: Bowel sounds are normal.     Palpations: Abdomen is soft.  Musculoskeletal: Normal range of motion.  Skin:    General: Skin is warm and dry.  Neurological:     Mental Status: She is alert and oriented to person, place, and time.     Coordination: Coordination normal.  Psychiatric:        Behavior: Behavior normal. Behavior is cooperative.        Thought Content: Thought content normal.        Judgment: Judgment normal.          Patient has been counseled extensively about nutrition and exercise as well as the importance of adherence with medications and regular follow-up. The patient was given clear instructions to go to ER or return to medical center if symptoms don't improve, worsen or new problems develop. The patient verbalized understanding.   Follow-up: Return for schedule pap.   Gildardo Pounds, FNP-BC [facility] and [place], [place]   07/28/2019, 3:16 PM

## 2019-07-29 MED FILL — ?BASAGLAR 100 UNITS/ML KWPE: 100 | 30 days supply | Qty: 12 | Fill #0

## 2019-07-30 LAB — CMP14+EGFR
ALT: 12 IU/L (ref 0–32)
AST: 15 IU/L (ref 0–40)
Albumin/Globulin Ratio: 1.5 (ref 1.2–2.2)
Albumin: 4 g/dL (ref 3.8–4.9)
Alkaline Phosphatase: 49 IU/L (ref 39–117)
BUN/Creatinine Ratio: 11 (ref 9–23)
BUN: 8 mg/dL (ref 6–24)
Bilirubin Total: <0.2 mg/dL (ref 0.0–1.2)
CO2: 24 mmol/L (ref 20–29)
Calcium: 9.4 mg/dL (ref 8.7–10.2)
Chloride: 99 mmol/L (ref 96–106)
Creatinine, Ser: 0.72 mg/dL (ref 0.57–1.00)
GFR calc Af Amer: 108 mL/min/{1.73_m2} (ref >59)
GFR calc non Af Amer: 94 mL/min/{1.73_m2} (ref >59)
Globulin, Total: 2.7 g/dL (ref 1.5–4.5)
Glucose: 487 mg/dL — ABNORMAL HIGH (ref 65–99)
Potassium: 3.9 mmol/L (ref 3.5–5.2)
Sodium: 139 mmol/L (ref 134–144)
Total Protein: 6.7 g/dL (ref 6.0–8.5)

## 2019-07-30 LAB — RHEUMATOID ARTHRITIS PROFILE
Cyclic Citrullin Peptide Ab: 8 units (ref 0–19)
Rheumatoid fact SerPl-aCnc: <10 IU/mL (ref 0.0–13.9)

## 2019-07-30 LAB — HCV RNA QUANT: Hepatitis C Quantitation: NOT DETECTED IU/mL

## 2019-08-06 ENCOUNTER — Ambulatory Visit (HOSPITAL_COMMUNITY): Payer: Self-pay

## 2019-08-29 ENCOUNTER — Encounter: Payer: Self-pay | Admitting: Cardiology

## 2019-08-29 DIAGNOSIS — Z7189 Other specified counseling: Secondary | ICD-10-CM | POA: Insufficient documentation

## 2019-08-29 NOTE — Progress Notes (Deleted)
Cardiology Office Note   Date:  08/29/2019   ID:  Anna Golden, DOB November 06, 1960, MRN [id number]  PCP:  Gildardo Pounds, NP  Cardiologist:   No primary care provider on file. Referring:  ***  No chief complaint on file.     History of Present Illness: Anna Golden is a 59 y.o. female who was referred by *** for evaluation of ***  Echo in 2016 was essentially unremarkable outside of diastolic dysfunction.  ***    Past Medical History:  Diagnosis Date  . Acute asthma exacerbation 03/07/2014  . Arthritis   . Asthma   . Diabetes mellitus   . DKA (diabetic ketoacidoses) ([place]) 03/07/2014  . DM (diabetes mellitus) ([place]) 04/24/2013  . Dysmenorrhea 01/23/2013  . Fatty liver disease, nonalcoholic   . Hypertension   . Hypokalemia   . Leiomyoma of uterus, unspecified 01/23/2013  . Leukocytosis   . Menorrhagia with regular cycle 01/23/2013  . Obesity   . PAF (paroxysmal atrial fibrillation) ([place])    a. Dx 03/2013, spont converted to NSR.  Marland Kitchen Poor social situation   . Sickle cell trait ([place])   . Smoking 03/18/2014    Past Surgical History:  Procedure Laterality Date  . HERNIA REPAIR       Current Outpatient Medications  Medication Sig Dispense Refill  . aspirin EC 325 MG tablet Take 1 tablet (325 mg total) by mouth daily. 90 tablet 2  . atorvastatin (LIPITOR) 40 MG tablet Take 1 tablet (40 mg total) by mouth daily. 90 tablet 2  . Calcium Carbonate-Vitamin D (CALCIUM 500 + D PO) Take by mouth.    . diltiazem (CARDIZEM CD) 120 MG 24 hr capsule Take 1 capsule (120 mg total) by mouth daily. 90 capsule 3  . gabapentin (NEURONTIN) 300 MG capsule Take 1 capsule (300 mg total) by mouth 3 (three) times daily. 90 capsule 3  . Insulin Glargine (BASAGLAR KWIKPEN) 100 UNIT/ML SOPN Inject 0.4 mLs (40 Units total) into the skin daily. 40 mL 1  . insulin lispro (HUMALOG) 100 UNIT/ML injection Inject 0.1 mLs (10 Units total) into the skin 3 (three) times daily before meals. 30 mL 1  .  Insulin Pen Needle (B-D UF III MINI PEN NEEDLES) 31G X 5 MM MISC Use as instructed. Inject into the skin four times per day. 100 each 3  . Insulin Syringes, Disposable, U-100 0.3 ML MISC Use as instructed. Inject into the skin once three times per day. 100 each 3  . lisinopril (ZESTRIL) 2.5 MG tablet Take 1 tablet (2.5 mg total) by mouth daily. 90 tablet 0  . omeprazole (PRILOSEC) 20 MG capsule Take 1 capsule (20 mg total) by mouth 2 (two) times daily before a meal. 60 capsule 3   No current facility-administered medications for this visit.    Allergies:   Metoprolol, Other, Latex, and Pork-derived products    Social History:  The patient  reports that she has been smoking cigarettes. She has been smoking about 0.25 packs per day. She has never used smokeless tobacco. She reports that she does not drink alcohol or use drugs.   Family History:  The patient's ***family history includes Cancer in her father; Diabetes in her mother; Heart disease in her mother; Sickle cell anemia in her mother.    ROS:  Please see the history of present illness.   Otherwise, review of systems are positive for {NONE DEFAULTED:18576::"none"}.   All other systems are reviewed and negative.  PHYSICAL EXAM: VS:  LMP 12/20/2014  , BMI There is no height or weight on file to calculate BMI. GENERAL:  Well appearing HEENT:  Pupils equal round and reactive, fundi not visualized, oral mucosa unremarkable NECK:  No jugular venous distention, waveform within normal limits, carotid upstroke brisk and symmetric, no bruits, no thyromegaly LYMPHATICS:  No cervical, inguinal adenopathy LUNGS:  Clear to auscultation bilaterally BACK:  No CVA tenderness CHEST:  Unremarkable HEART:  PMI not displaced or sustained,S1 and S2 within normal limits, no S3, no S4, no clicks, no rubs, *** murmurs ABD:  Flat, positive bowel sounds normal in frequency in pitch, no bruits, no rebound, no guarding, no midline pulsatile mass, no  hepatomegaly, no splenomegaly EXT:  2 plus pulses throughout, no edema, no cyanosis no clubbing SKIN:  No rashes no nodules NEURO:  Cranial nerves II through XII grossly intact, motor grossly intact throughout PSYCH:  Cognitively intact, oriented to person place and time    EKG:  EKG {ACTION; IS/IS [id number] ordered today. The ekg ordered today demonstrates ***   Recent Labs: 04/07/2019: Hemoglobin 13.5; Platelets 223 07/28/2019: ALT 12; BUN 8; Creatinine, Ser 0.72; Potassium 3.9; Sodium 139    Lipid Panel    Component Value Date/Time   CHOL 136 04/07/2019 1557   TRIG 129 04/07/2019 1557   HDL 48 04/07/2019 1557   CHOLHDL 2.8 04/07/2019 1557   CHOLHDL 2.5 04/08/2014 1059   VLDL 18 04/08/2014 1059   LDLCALC 62 04/07/2019 1557      Wt Readings from Last 3 Encounters:  07/28/19 190 lb (86.2 kg)  06/15/19 188 lb (85.3 kg)  12/22/14 189 lb (85.7 kg)      Other studies Reviewed: Additional studies/ records that were reviewed today include: ***. Review of the above records demonstrates:  Please see elsewhere in the note.  ***   ASSESSMENT AND PLAN:  ***  PAF:  ***  DM:  ***  TOBACCO ABUSE:  ***    HTN:  ***    COVID EDUCATION:  ***   Current medicines are reviewed at length with the patient today.  The patient {ACTIONS; HAS/DOES NOT HAVE:19233} concerns regarding medicines.  The following changes have been made:  {PLAN; NO CHANGE:13088:s}  Labs/ tests ordered today include: *** No orders of the defined types were placed in this encounter.    Disposition:   FU with ***    Signed, Minus Breeding, MD  08/29/2019 6:37 PM     [facility]

## 2019-09-01 ENCOUNTER — Ambulatory Visit: Payer: Self-pay | Admitting: Cardiology

## 2019-09-03 ENCOUNTER — Ambulatory Visit (HOSPITAL_COMMUNITY): Payer: Self-pay

## 2019-09-15 MED FILL — ?HUMALOG 100 UNITS/ML VIAL: 100 | 33 days supply | Qty: 10 | Fill #1

## 2019-09-15 MED FILL — DILTIAZEM 24HR ER 120 MG CA: 120 | 30 days supply | Qty: 30 | Fill #1

## 2019-09-17 NOTE — Progress Notes (Deleted)
Cardiology Office Note   Date:  09/17/2019   ID:  Anna Golden, DOB 1961/07/20, MRN [id number]  PCP:  Gildardo Pounds, NP  Cardiologist:   No primary care provider on file. Referring:  ***  No chief complaint on file.     History of Present Illness: Anna Golden is a 59 y.o. female who was referred by *** for evaluation of ***  Echo in 2016 was essentially unremarkable outside of diastolic dysfunction.  ***    Past Medical History:  Diagnosis Date  . Acute asthma exacerbation 03/07/2014  . Arthritis   . Asthma   . DKA (diabetic ketoacidoses) ([place]) 03/07/2014  . DM (diabetes mellitus) ([place]) 04/24/2013  . Dysmenorrhea 01/23/2013  . Fatty liver disease, nonalcoholic   . Hypertension   . Leiomyoma of uterus, unspecified 01/23/2013  . Leukocytosis   . Menorrhagia with regular cycle 01/23/2013  . Obesity   . PAF (paroxysmal atrial fibrillation) ([place])    a. Dx 03/2013, spont converted to NSR.  Marland Kitchen Sickle cell trait ([place])   . Smoking 03/18/2014    Past Surgical History:  Procedure Laterality Date  . HERNIA REPAIR       Current Outpatient Medications  Medication Sig Dispense Refill  . atorvastatin (LIPITOR) 40 MG tablet Take 1 tablet (40 mg total) by mouth daily. 90 tablet 2  . Calcium Carbonate-Vitamin D (CALCIUM 500 + D PO) Take by mouth.    . diltiazem (CARDIZEM CD) 120 MG 24 hr capsule Take 1 capsule (120 mg total) by mouth daily. 90 capsule 3  . gabapentin (NEURONTIN) 300 MG capsule Take 1 capsule (300 mg total) by mouth 3 (three) times daily. 90 capsule 3  . Insulin Glargine (BASAGLAR KWIKPEN) 100 UNIT/ML SOPN Inject 0.4 mLs (40 Units total) into the skin daily. 40 mL 1  . insulin lispro (HUMALOG) 100 UNIT/ML injection Inject 0.1 mLs (10 Units total) into the skin 3 (three) times daily before meals. 30 mL 1  . Insulin Pen Needle (B-D UF III MINI PEN NEEDLES) 31G X 5 MM MISC Use as instructed. Inject into the skin four times per day. 100 each 3  . Insulin Syringes,  Disposable, U-100 0.3 ML MISC Use as instructed. Inject into the skin once three times per day. 100 each 3  . lisinopril (ZESTRIL) 2.5 MG tablet Take 1 tablet (2.5 mg total) by mouth daily. 90 tablet 0  . omeprazole (PRILOSEC) 20 MG capsule Take 1 capsule (20 mg total) by mouth 2 (two) times daily before a meal. 60 capsule 3   No current facility-administered medications for this visit.    Allergies:   Metoprolol, Other, Latex, and Pork-derived products    Social History:  The patient  reports that she has been smoking cigarettes. She has been smoking about 0.25 packs per day. She has never used smokeless tobacco. She reports that she does not drink alcohol or use drugs.   Family History:  The patient's ***family history includes Cancer in her father; Diabetes in her mother; Heart disease in her mother; Sickle cell anemia in her mother.    ROS:  Please see the history of present illness.   Otherwise, review of systems are positive for {NONE DEFAULTED:18576::"none"}.   All other systems are reviewed and negative.    PHYSICAL EXAM: VS:  LMP 12/20/2014  , BMI There is no height or weight on file to calculate BMI. GENERAL:  Well appearing HEENT:  Pupils equal round and reactive, fundi not  visualized, oral mucosa unremarkable NECK:  No jugular venous distention, waveform within normal limits, carotid upstroke brisk and symmetric, no bruits, no thyromegaly LYMPHATICS:  No cervical, inguinal adenopathy LUNGS:  Clear to auscultation bilaterally BACK:  No CVA tenderness CHEST:  Unremarkable HEART:  PMI not displaced or sustained,S1 and S2 within normal limits, no S3, no S4, no clicks, no rubs, *** murmurs ABD:  Flat, positive bowel sounds normal in frequency in pitch, no bruits, no rebound, no guarding, no midline pulsatile mass, no hepatomegaly, no splenomegaly EXT:  2 plus pulses throughout, no edema, no cyanosis no clubbing SKIN:  No rashes no nodules NEURO:  Cranial nerves II through XII  grossly intact, motor grossly intact throughout PSYCH:  Cognitively intact, oriented to person place and time    EKG:  EKG {ACTION; IS/IS [id number] ordered today. The ekg ordered today demonstrates ***   Recent Labs: 04/07/2019: Hemoglobin 13.5; Platelets 223 07/28/2019: ALT 12; BUN 8; Creatinine, Ser 0.72; Potassium 3.9; Sodium 139    Lipid Panel    Component Value Date/Time   CHOL 136 04/07/2019 1557   TRIG 129 04/07/2019 1557   HDL 48 04/07/2019 1557   CHOLHDL 2.8 04/07/2019 1557   CHOLHDL 2.5 04/08/2014 1059   VLDL 18 04/08/2014 1059   LDLCALC 62 04/07/2019 1557      Wt Readings from Last 3 Encounters:  07/28/19 190 lb (86.2 kg)  06/15/19 188 lb (85.3 kg)  12/22/14 189 lb (85.7 kg)      Other studies Reviewed: Additional studies/ records that were reviewed today include: ***. Review of the above records demonstrates:  Please see elsewhere in the note.  ***   ASSESSMENT AND PLAN:  ***  PAF:  ***  DM:  ***  TOBACCO ABUSE:  ***    HTN:  ***    COVID EDUCATION:  ***   Current medicines are reviewed at length with the patient today.  The patient {ACTIONS; HAS/DOES NOT HAVE:19233} concerns regarding medicines.  The following changes have been made:  {PLAN; NO CHANGE:13088:s}  Labs/ tests ordered today include: *** No orders of the defined types were placed in this encounter.    Disposition:   FU with ***    Signed, Minus Breeding, MD  09/17/2019 8:10 PM    [facility]

## 2019-09-18 ENCOUNTER — Ambulatory Visit: Payer: Self-pay | Admitting: Cardiology

## 2019-09-24 ENCOUNTER — Ambulatory Visit (HOSPITAL_COMMUNITY): Payer: Self-pay

## 2019-10-06 ENCOUNTER — Ambulatory Visit: Payer: Self-pay | Attending: Nurse Practitioner | Admitting: Nurse Practitioner

## 2019-10-06 ENCOUNTER — Other Ambulatory Visit: Payer: Self-pay

## 2019-10-06 ENCOUNTER — Encounter: Payer: Self-pay | Admitting: Nurse Practitioner

## 2019-10-06 DIAGNOSIS — I480 Paroxysmal atrial fibrillation: Secondary | ICD-10-CM

## 2019-10-06 DIAGNOSIS — F17200 Nicotine dependence, unspecified, uncomplicated: Secondary | ICD-10-CM

## 2019-10-06 DIAGNOSIS — M2550 Pain in unspecified joint: Secondary | ICD-10-CM

## 2019-10-06 MED ORDER — DULOXETINE HCL 30 MG PO CPEP: 30.0000 mg | ORAL_CAPSULE | Freq: Every day | ORAL | 2 refills | Status: DC

## 2019-10-06 MED FILL — DULoxetine HCL 30 MG CPEP: 30 | 30 days supply | Qty: 30 | Fill #0

## 2019-10-06 NOTE — Progress Notes (Signed)
Virtual Visit via Telephone Note Due to national recommendations of social distancing due to [place] 19, telehealth visit is felt to be most appropriate for this patient at this time.  I discussed the limitations, risks, security and privacy concerns of performing an evaluation and management service by telephone and the availability of in person appointments. I also discussed with the patient that there may be a patient responsible charge related to this service. The patient expressed understanding and agreed to proceed.    I connected with Anna Golden on 10/06/19  at  10:30 AM EST  EDT by telephone and verified that I am speaking with the correct person using two identifiers.   Consent I discussed the limitations, risks, security and privacy concerns of performing an evaluation and management service by telephone and the availability of in person appointments. I also discussed with the patient that there may be a patient responsible charge related to this service. The patient expressed understanding and agreed to proceed.   Location of Patient: Private Residence   Location of Provider: [place] and [employer] Office    Persons participating in Telemedicine visit: [name] FNP-BC [name]    History of Present Illness: Telemedicine visit for: Generalized arthralgais  Taking tylenol arthritis for joint pain. Pain specifically in lower back, arms and legs. Also endorses peripheral neuropathy. States gabapentin is ineffective. Pain is chronic.     Past Medical History:  Diagnosis Date  . Acute asthma exacerbation 03/07/2014  . Arthritis   . Asthma   . DKA (diabetic ketoacidoses) ([place]) 03/07/2014  . DM (diabetes mellitus) ([place]) 04/24/2013  . Dysmenorrhea 01/23/2013  . Fatty liver disease, nonalcoholic   . Hypertension   . Leiomyoma of uterus, unspecified 01/23/2013  . Leukocytosis   . Menorrhagia with regular cycle 01/23/2013  . Obesity   . PAF  (paroxysmal atrial fibrillation) ([place])    a. Dx 03/2013, spont converted to NSR.  Marland Kitchen Sickle cell trait ([place])   . Smoking 03/18/2014    Past Surgical History:  Procedure Laterality Date  . HERNIA REPAIR      Family History  Problem Relation Age of Onset  . Heart disease Mother   . Diabetes Mother   . Sickle cell anemia Mother   . Cancer Father        colon    Social History   Socioeconomic History  . Marital status: Legally Separated    Spouse name: Not on file  . Number of children: Not on file  . Years of education: Not on file  . Highest education level: Not on file  Occupational History  . Not on file  Tobacco Use  . Smoking status: Current Every Day Smoker    Packs/day: 0.25    Types: Cigarettes  . Smokeless tobacco: Never Used  Substance and Sexual Activity  . Alcohol use: No  . Drug use: No  . Sexual activity: Yes  Other Topics Concern  . Not on file  Social History Narrative  . Not on file   Social Determinants of Health   Financial Resource Strain:   . Difficulty of Paying Living Expenses: Not on file  Food Insecurity:   . Worried About Charity fundraiser in the Last Year: Not on file  . Ran Out of Food in the Last Year: Not on file  Transportation Needs:   . Lack of Transportation (Medical): Not on file  . Lack of Transportation (Non-Medical): Not on file  Physical Activity:   . Days of Exercise per Week: Not on file  . Minutes of Exercise per Session: Not on file  Stress:   . Feeling of Stress : Not on file  Social Connections:   . Frequency of Communication with Friends and Family: Not on file  . Frequency of Social Gatherings with Friends and Family: Not on file  . Attends Religious Services: Not on file  . Active Member of Clubs or Organizations: Not on file  . Attends Archivist Meetings: Not on file  . Marital Status: Not on file     Observations/Objective: Awake, alert and oriented x 3   ROS  Assessment and Plan: Anna Golden  was seen today for pain.  Diagnoses and all orders for this visit:  Arthralgia of multiple joints -     DULoxetine (CYMBALTA) 30 MG capsule; Take 1 capsule (30 mg total) by mouth daily. Work on losing weight to help reduce joint pain. May alternate with heat and ice application for pain relief. May also alternate with acetaminophen and Ibuprofen as prescribed pain relief. Other alternatives include massage, acupuncture and water aerobics.  You must stay active and avoid a sedentary lifestyle.  PAF (paroxysmal atrial fibrillation) (Anna Golden) -     Ambulatory referral to Cardiology Taking cardizem 120 mg daily History of non adherence with xarelto so it was stopped in 2014. She as living in a shelter at that time.   Tobacco dependence Anna Golden was counseled on the dangers of tobacco use, and was advised to quit. Reviewed strategies to maximize success, including removing cigarettes and smoking materials from environment, stress management and support of family/friends as well as pharmacological alternatives including: Wellbutrin, Chantix, Nicotine patch, Nicotine gum or lozenges. Smoking cessation support: smoking cessation hotline: 1-800-QUIT-NOW.  Smoking cessation classes are also available through [facility]. Call [phone] or visit our website at [email or web address]   A total of 2 minutes was spent on counseling for smoking cessation and Anna Golden is not ready to quit.      Follow Up Instructions Return in about 3 months (around 01/03/2020).     I discussed the assessment and treatment plan with the patient. The patient was provided an opportunity to ask questions and all were answered. The patient agreed with the plan and demonstrated an understanding of the instructions.   The patient was advised to call back or seek an in-person evaluation if the symptoms worsen or if the condition fails to improve as anticipated.  I provided 17 minutes of non-face-to-face time  during this encounter including median intraservice time, reviewing previous notes, labs, imaging, medications and explaining diagnosis and management.  Gildardo Pounds, FNP-BC

## 2019-10-18 ENCOUNTER — Encounter: Payer: Self-pay | Admitting: Nurse Practitioner

## 2019-10-22 NOTE — Progress Notes (Deleted)
CARDIOLOGY CONSULT NOTE       Patient ID: Anna Golden MRN: [id number] DOB/AGE: October 18, 1960 59 y.o.  Admit date: (Not on file) Referring Physician: Raul Del Primary Physician: Gildardo Pounds, NP Primary Cardiologist: New Reason for Consultation: PAF  Active Problems:   * No active hospital problems. *   HPI:  59 y.o. referred by [name] NP for PAF.  This is a very distant history preceding 2014 and has not been active Apparently then she was living in shelter and anticoagulation not reliable and stopped. She has been on cardizem since then She is a smoker with little motivation to quit  Also being Rx for HTN, DM and HLD She has had asthmatic flairs in past and beta blockers have been avoided She had a normal nuclear study with no ischemia in 2014 At that time had PAF in setting of DKA with conversion quickly with iv cardizem Echo 04/25/13 EF 60-65% no valve disease   This patients CHA2DS2-VASc Score and unadjusted Ischemic Stroke Rate (% per year) is equal to 3.2 % stroke rate/year from a score of 3  Above score calculated as 1 point each if present [CHF, HTN, DM, Vascular=MI/PAD/Aortic Plaque, Age if 65-74, or Female] Above score calculated as 2 points each if present [Age > 75, or Stroke/TIA/TE]   ***        ROS All other systems reviewed and negative except as noted above  Past Medical History:  Diagnosis Date  . Acute asthma exacerbation 03/07/2014  . Arthritis   . Asthma   . DKA (diabetic ketoacidoses) ([place]) 03/07/2014  . DM (diabetes mellitus) ([place]) 04/24/2013  . Dysmenorrhea 01/23/2013  . Fatty liver disease, nonalcoholic   . Hypertension   . Leiomyoma of uterus, unspecified 01/23/2013  . Leukocytosis   . Menorrhagia with regular cycle 01/23/2013  . Obesity   . PAF (paroxysmal atrial fibrillation) ([place])    a. Dx 03/2013, spont converted to NSR.  Marland Kitchen Sickle cell trait ([place])   . Smoking 03/18/2014    Family History  Problem Relation Age of Onset  . Heart  disease Mother   . Diabetes Mother   . Sickle cell anemia Mother   . Cancer Father        colon    Social History   Socioeconomic History  . Marital status: Legally Separated    Spouse name: Not on file  . Number of children: Not on file  . Years of education: Not on file  . Highest education level: Not on file  Occupational History  . Not on file  Tobacco Use  . Smoking status: Current Every Day Smoker    Packs/day: 0.25    Types: Cigarettes  . Smokeless tobacco: Never Used  Substance and Sexual Activity  . Alcohol use: No  . Drug use: No  . Sexual activity: Yes  Other Topics Concern  . Not on file  Social History Narrative  . Not on file   Social Determinants of Health   Financial Resource Strain:   . Difficulty of Paying Living Expenses: Not on file  Food Insecurity:   . Worried About Charity fundraiser in the Last Year: Not on file  . Ran Out of Food in the Last Year: Not on file  Transportation Needs:   . Lack of Transportation (Medical): Not on file  . Lack of Transportation (Non-Medical): Not on file  Physical Activity:   . Days of Exercise per Week: Not on file  . Minutes  of Exercise per Session: Not on file  Stress:   . Feeling of Stress : Not on file  Social Connections:   . Frequency of Communication with Friends and Family: Not on file  . Frequency of Social Gatherings with Friends and Family: Not on file  . Attends Religious Services: Not on file  . Active Member of Clubs or Organizations: Not on file  . Attends Archivist Meetings: Not on file  . Marital Status: Not on file  Intimate Partner Violence:   . Fear of Current or Ex-Partner: Not on file  . Emotionally Abused: Not on file  . Physically Abused: Not on file  . Sexually Abused: Not on file    Past Surgical History:  Procedure Laterality Date  . HERNIA REPAIR        Current Outpatient Medications:  .  Acetaminophen (TYLENOL ARTHRITIS PAIN PO), Take by mouth., Disp: , Rfl:   .  atorvastatin (LIPITOR) 40 MG tablet, Take 1 tablet (40 mg total) by mouth daily., Disp: 90 tablet, Rfl: 2 .  Calcium Carbonate-Vitamin D (CALCIUM 500 + D PO), Take by mouth., Disp: , Rfl:  .  diltiazem (CARDIZEM CD) 120 MG 24 hr capsule, Take 1 capsule (120 mg total) by mouth daily., Disp: 90 capsule, Rfl: 3 .  DULoxetine (CYMBALTA) 30 MG capsule, Take 1 capsule (30 mg total) by mouth daily., Disp: 30 capsule, Rfl: 2 .  Insulin Glargine (BASAGLAR KWIKPEN) 100 UNIT/ML SOPN, Inject 0.4 mLs (40 Units total) into the skin daily., Disp: 40 mL, Rfl: 1 .  insulin lispro (HUMALOG) 100 UNIT/ML injection, Inject 0.1 mLs (10 Units total) into the skin 3 (three) times daily before meals., Disp: 30 mL, Rfl: 1 .  Insulin Pen Needle (B-D UF III MINI PEN NEEDLES) 31G X 5 MM MISC, Use as instructed. Inject into the skin four times per day., Disp: 100 each, Rfl: 3 .  Insulin Syringes, Disposable, U-100 0.3 ML MISC, Use as instructed. Inject into the skin once three times per day., Disp: 100 each, Rfl: 3 .  lisinopril (ZESTRIL) 2.5 MG tablet, Take 1 tablet (2.5 mg total) by mouth daily. (Patient not taking: Reported on 10/06/2019), Disp: 90 tablet, Rfl: 0 .  omeprazole (PRILOSEC) 20 MG capsule, Take 1 capsule (20 mg total) by mouth 2 (two) times daily before a meal., Disp: 60 capsule, Rfl: 3    Physical Exam: Last menstrual period 12/20/2014.    Affect appropriate Healthy:  appears stated age 59: normal Neck supple with no adenopathy JVP normal no bruits no thyromegaly Lungs clear with no wheezing and good diaphragmatic motion Heart:  S1/S2 no murmur, no rub, gallop or click PMI normal Abdomen: benighn, BS positve, no tenderness, no AAA no bruit.  No HSM or HJR Distal pulses intact with no bruits No edema Neuro non-focal Skin warm and dry No muscular weakness   Labs:   Lab Results  Component Value Date   WBC 9.5 04/07/2019   HGB 13.5 04/07/2019   HCT 40.0 04/07/2019   MCV 89 04/07/2019    PLT 223 04/07/2019   No results for input(s): NA, K, CL, CO2, BUN, CREATININE, CALCIUM, PROT, BILITOT, ALKPHOS, ALT, AST, GLUCOSE in the last 168 hours.  Invalid input(s): LABALBU Lab Results  Component Value Date   CKTOTAL 74 10/02/2011   CKMB 1.5 10/02/2011   TROPONINI <0.30 03/07/2014    Lab Results  Component Value Date   CHOL 136 04/07/2019   CHOL 105 04/08/2014   CHOL  111 04/25/2013   Lab Results  Component Value Date   HDL 48 04/07/2019   HDL 42 04/08/2014   HDL 48 04/25/2013   Lab Results  Component Value Date   LDLCALC 62 04/07/2019   LDLCALC 45 04/08/2014   LDLCALC 43 04/25/2013   Lab Results  Component Value Date   TRIG 129 04/07/2019   TRIG 92 04/08/2014   TRIG 99 04/25/2013   Lab Results  Component Value Date   CHOLHDL 2.8 04/07/2019   CHOLHDL 2.5 04/08/2014   CHOLHDL 2.3 04/25/2013   No results found for: LDLDIRECT    Radiology: No results found.  EKG: SR rate 90 normal    ASSESSMENT AND PLAN:   1. PAF:  Distant episode almost 7 years ago in setting of DKA. Continue cardizem despite CHADVASC 3 don't think should be on anticoagulation unless she has another episode given quiescence for so long.   2. HTN:  Well controlled.  Continue current medications and low sodium Dash type diet.    3. DM:  Discussed low carb diet.  Target hemoglobin A1c is 6.5 or less.  Continue current medications.  4. Smoking:  F/U with primary for CXR and lung cancer screening CT when of age  66. HLD:  Continue statin LDL 62   Signed: Jenkins Rouge 10/22/2019, 8:21 AM

## 2019-10-23 ENCOUNTER — Other Ambulatory Visit: Payer: Self-pay

## 2019-10-26 ENCOUNTER — Other Ambulatory Visit: Payer: Self-pay

## 2019-10-27 ENCOUNTER — Ambulatory Visit: Payer: Self-pay | Admitting: Cardiovascular Disease

## 2019-11-09 MED FILL — ?BASAGLAR 100 UNITS/ML KWPE: 100 | 30 days supply | Qty: 12 | Fill #1

## 2019-11-09 MED FILL — CARTIA XT 120 MG CP24: 120 | 30 days supply | Qty: 30 | Fill #2

## 2019-11-09 MED FILL — ?HUMALOG 100 UNITS/ML VIAL: 100 | 33 days supply | Qty: 10 | Fill #2

## 2019-11-09 MED FILL — ?ATORVASTATIN 40MG TABLET: 40 | 30 days supply | Qty: 30 | Fill #1

## 2019-11-13 NOTE — Progress Notes (Deleted)
CARDIOLOGY CONSULT NOTE       Patient ID: Anna Golden MRN: [id number] DOB/AGE: 1961/05/28 59 y.o.  Admit date: (Not on file) Referring Physician: Raul Del Primary Physician: Gildardo Pounds, NP Primary Cardiologist: New Reason for Consultation: PAF  Active Problems:   * No active hospital problems. *   HPI:  59 y.o. referred by [name] NP for PAF. History of asthma, smoking, DM, HLD and HTN Takes Cymbalta for arthralgia's with neuropathic pain as well Last seen by cardiology in 2015 PAF in setting of DKA and URI; Was living in homeless shelter and not compliant with DOAC Afib resolved and has not recurred since then 05/15/13 normal myovue Echo August 2014 normal EF no valve disease   DM still poorly controlled last A1c 07/28/19 13.6   ***  ROS All other systems reviewed and negative except as noted above  Past Medical History:  Diagnosis Date  . Acute asthma exacerbation 03/07/2014  . Arthritis   . Asthma   . DKA (diabetic ketoacidoses) ([place]) 03/07/2014  . DM (diabetes mellitus) ([place]) 04/24/2013  . Dysmenorrhea 01/23/2013  . Fatty liver disease, nonalcoholic   . Hypertension   . Leiomyoma of uterus, unspecified 01/23/2013  . Leukocytosis   . Menorrhagia with regular cycle 01/23/2013  . Obesity   . PAF (paroxysmal atrial fibrillation) ([place])    a. Dx 03/2013, spont converted to NSR.  Marland Kitchen Sickle cell trait ([place])   . Smoking 03/18/2014    Family History  Problem Relation Age of Onset  . Heart disease Mother   . Diabetes Mother   . Sickle cell anemia Mother   . Cancer Father        colon    Social History   Socioeconomic History  . Marital status: Legally Separated    Spouse name: Not on file  . Number of children: Not on file  . Years of education: Not on file  . Highest education level: Not on file  Occupational History  . Not on file  Tobacco Use  . Smoking status: Current Every Day Smoker    Packs/day: 0.25    Types: Cigarettes  . Smokeless tobacco:  Never Used  Substance and Sexual Activity  . Alcohol use: No  . Drug use: No  . Sexual activity: Yes  Other Topics Concern  . Not on file  Social History Narrative  . Not on file   Social Determinants of Health   Financial Resource Strain:   . Difficulty of Paying Living Expenses:   Food Insecurity:   . Worried About Charity fundraiser in the Last Year:   . Arboriculturist in the Last Year:   Transportation Needs:   . Film/video editor (Medical):   Marland Kitchen Lack of Transportation (Non-Medical):   Physical Activity:   . Days of Exercise per Week:   . Minutes of Exercise per Session:   Stress:   . Feeling of Stress :   Social Connections:   . Frequency of Communication with Friends and Family:   . Frequency of Social Gatherings with Friends and Family:   . Attends Religious Services:   . Active Member of Clubs or Organizations:   . Attends Archivist Meetings:   Marland Kitchen Marital Status:   Intimate Partner Violence:   . Fear of Current or Ex-Partner:   . Emotionally Abused:   Marland Kitchen Physically Abused:   . Sexually Abused:     Past Surgical History:  Procedure Laterality Date  .  HERNIA REPAIR        Current Outpatient Medications:  .  Acetaminophen (TYLENOL ARTHRITIS PAIN PO), Take by mouth., Disp: , Rfl:  .  atorvastatin (LIPITOR) 40 MG tablet, Take 1 tablet (40 mg total) by mouth daily., Disp: 90 tablet, Rfl: 2 .  Calcium Carbonate-Vitamin D (CALCIUM 500 + D PO), Take by mouth., Disp: , Rfl:  .  diltiazem (CARDIZEM CD) 120 MG 24 hr capsule, Take 1 capsule (120 mg total) by mouth daily., Disp: 90 capsule, Rfl: 3 .  DULoxetine (CYMBALTA) 30 MG capsule, Take 1 capsule (30 mg total) by mouth daily., Disp: 30 capsule, Rfl: 2 .  Insulin Glargine (BASAGLAR KWIKPEN) 100 UNIT/ML SOPN, Inject 0.4 mLs (40 Units total) into the skin daily., Disp: 40 mL, Rfl: 1 .  insulin lispro (HUMALOG) 100 UNIT/ML injection, Inject 0.1 mLs (10 Units total) into the skin 3 (three) times daily  before meals., Disp: 30 mL, Rfl: 1 .  Insulin Pen Needle (B-D UF III MINI PEN NEEDLES) 31G X 5 MM MISC, Use as instructed. Inject into the skin four times per day., Disp: 100 each, Rfl: 3 .  Insulin Syringes, Disposable, U-100 0.3 ML MISC, Use as instructed. Inject into the skin once three times per day., Disp: 100 each, Rfl: 3 .  lisinopril (ZESTRIL) 2.5 MG tablet, Take 1 tablet (2.5 mg total) by mouth daily. (Patient not taking: Reported on 10/06/2019), Disp: 90 tablet, Rfl: 0 .  omeprazole (PRILOSEC) 20 MG capsule, Take 1 capsule (20 mg total) by mouth 2 (two) times daily before a meal., Disp: 60 capsule, Rfl: 3    Physical Exam: Last menstrual period 12/20/2014.   Affect appropriate Overweight black female  HEENT: normal Neck supple with no adenopathy JVP normal no bruits no thyromegaly Lungs clear with no wheezing and good diaphragmatic motion Heart:  S1/S2 no murmur, no rub, gallop or click PMI normal Abdomen: benighn, BS positve, no tenderness, no AAA no bruit.  No HSM or HJR Distal pulses intact with no bruits No edema Neuro non-focal Skin warm and dry No muscular weakness   Labs:   Lab Results  Component Value Date   WBC 9.5 04/07/2019   HGB 13.5 04/07/2019   HCT 40.0 04/07/2019   MCV 89 04/07/2019   PLT 223 04/07/2019   No results for input(s): NA, K, CL, CO2, BUN, CREATININE, CALCIUM, PROT, BILITOT, ALKPHOS, ALT, AST, GLUCOSE in the last 168 hours.  Invalid input(s): LABALBU Lab Results  Component Value Date   CKTOTAL 74 10/02/2011   CKMB 1.5 10/02/2011   TROPONINI <0.30 03/07/2014    Lab Results  Component Value Date   CHOL 136 04/07/2019   CHOL 105 04/08/2014   CHOL 111 04/25/2013   Lab Results  Component Value Date   HDL 48 04/07/2019   HDL 42 04/08/2014   HDL 48 04/25/2013   Lab Results  Component Value Date   LDLCALC 62 04/07/2019   LDLCALC 45 04/08/2014   LDLCALC 43 04/25/2013   Lab Results  Component Value Date   TRIG 129 04/07/2019    TRIG 92 04/08/2014   TRIG 99 04/25/2013   Lab Results  Component Value Date   CHOLHDL 2.8 04/07/2019   CHOLHDL 2.5 04/08/2014   CHOLHDL 2.3 04/25/2013   No results found for: LDLDIRECT    Radiology: No results found.  EKG: SR rate 90 QT 394 07/28/19    ASSESSMENT AND PLAN:   1. PAF:  Over 7 years ago in setting of  URIDKA  *** 2. HTN:  Well controlled.  Continue current medications and low sodium Dash type diet.   3. HLD:  Continue statin labs with primary  4. DM:  Discussed low carb diet.  Target hemoglobin A1c is 6.5 or less.  Continue current medications. 5. Arthralgia:  Continue cymbalta  Signed: Jenkins Rouge 11/13/2019, 4:12 PM

## 2019-11-19 ENCOUNTER — Ambulatory Visit: Payer: Self-pay | Admitting: Cardiovascular Disease

## 2019-12-10 MED FILL — ?HUMALOG 100 UNITS/ML VIAL: 100 | 33 days supply | Qty: 10 | Fill #3

## 2019-12-10 MED FILL — ?ATORVASTATIN 40MG TABLET: 40 | 30 days supply | Qty: 30 | Fill #2

## 2019-12-10 MED FILL — CARTIA XT 120 MG CP24: 120 | 30 days supply | Qty: 30 | Fill #3

## 2019-12-31 ENCOUNTER — Ambulatory Visit: Payer: Self-pay

## 2020-01-12 ENCOUNTER — Ambulatory Visit: Payer: Self-pay | Admitting: Nurse Practitioner

## 2020-01-26 MED FILL — ?HUMALOG 100 UNITS/ML VIAL: 100 | 30 days supply | Qty: 10 | Fill #4

## 2020-01-26 MED FILL — CARTIA XT 120 MG CP24: 120 | 30 days supply | Qty: 30 | Fill #4

## 2020-02-26 ENCOUNTER — Ambulatory Visit: Payer: Self-pay | Admitting: Nurse Practitioner

## 2020-03-14 ENCOUNTER — Other Ambulatory Visit: Payer: Self-pay | Admitting: Nurse Practitioner

## 2020-03-14 DIAGNOSIS — I480 Paroxysmal atrial fibrillation: Secondary | ICD-10-CM

## 2020-03-14 DIAGNOSIS — E1165 Type 2 diabetes mellitus with hyperglycemia: Secondary | ICD-10-CM

## 2020-03-14 DIAGNOSIS — E1142 Type 2 diabetes mellitus with diabetic polyneuropathy: Secondary | ICD-10-CM

## 2020-03-14 MED ORDER — INSULIN SYRINGES (DISPOSABLE) U-100 0.3 ML MISC: 3 refills | Status: DC

## 2020-03-14 MED FILL — TRUEPLUS SYR 0.3ML 30GX5/16: 30G X 5/16" | 33 days supply | Qty: 100 | Fill #0

## 2020-03-14 NOTE — Telephone Encounter (Signed)
Requested Prescriptions  Pending Prescriptions Disp Refills  . diltiazem (CARDIZEM CD) 120 MG 24 hr capsule 90 capsule     Sig: Take 1 capsule (120 mg total) by mouth daily.     Cardiovascular:  Calcium Channel Blockers Failed - 03/14/2020  4:08 PM      Failed - Valid encounter within last 6 months    Recent Outpatient Visits          5 months ago Arthralgia of multiple joints   [facility], [place] W, NP   7 months ago Poorly controlled type 2 diabetes mellitus with peripheral neuropathy [facility])   [place], [place] W, NP   9 months ago Poorly controlled type 2 diabetes mellitus with peripheral neuropathy [facility])   [place], [place] W, NP   11 months ago Poorly controlled type 2 diabetes mellitus with peripheral neuropathy [facility])   [place], [place] W, NP   5 years ago Diabetes mellitus due to underlying condition without complications   [place] Lorayne Marek, MD      Future Appointments            In 1 month Gildardo Pounds, NP [place] BP in normal range    BP Readings from Last 1 Encounters:  07/28/19 109/69         . Insulin Syringes, Disposable, U-100 0.3 ML MISC 100 each 3    Sig: Use as instructed. Inject into the skin once three times per day.     There is no refill protocol information for this order

## 2020-03-14 NOTE — Telephone Encounter (Signed)
Pt called in to request a refill for   diltiazem (CARDIZEM CD) 120 MG 24 hr capsule Insulin Syringes, Disposable, U-100 0.3 ML MISC  Tru Metrix test strips   Pharmacy: [facility], [address]  [place], [place] [address]  Phone:  [phone] Fax:  [phone]

## 2020-03-14 NOTE — Telephone Encounter (Signed)
Prescription request being processed- patient has requested test strips that are not on current medication list. Will need new Rx for this: Tru Metrix test strips

## 2020-03-28 ENCOUNTER — Encounter (HOSPITAL_COMMUNITY): Payer: Self-pay | Admitting: Emergency Medicine

## 2020-03-28 ENCOUNTER — Emergency Department (HOSPITAL_COMMUNITY)
Admission: EM | Admit: 2020-03-28 | Discharge: 2020-03-28 | Disposition: A | Discharge status: Home / Self Care | Payer: Medicaid - Out of State | Attending: Emergency Medicine | Admitting: Emergency Medicine

## 2020-03-28 ENCOUNTER — Other Ambulatory Visit: Payer: Self-pay

## 2020-03-28 ENCOUNTER — Ambulatory Visit: Payer: Self-pay | Admitting: *Deleted

## 2020-03-28 ENCOUNTER — Emergency Department (HOSPITAL_COMMUNITY): Payer: Medicaid - Out of State

## 2020-03-28 DIAGNOSIS — I10 Essential (primary) hypertension: Secondary | ICD-10-CM | POA: Diagnosis not present

## 2020-03-28 DIAGNOSIS — I471 Supraventricular tachycardia: Secondary | ICD-10-CM | POA: Diagnosis not present

## 2020-03-28 DIAGNOSIS — Z9114 Patient's other noncompliance with medication regimen: Secondary | ICD-10-CM

## 2020-03-28 DIAGNOSIS — E1110 Type 2 diabetes mellitus with ketoacidosis without coma: Secondary | ICD-10-CM | POA: Diagnosis not present

## 2020-03-28 DIAGNOSIS — R000 Tachycardia, unspecified: Secondary | ICD-10-CM | POA: Diagnosis present

## 2020-03-28 DIAGNOSIS — E1165 Type 2 diabetes mellitus with hyperglycemia: Secondary | ICD-10-CM

## 2020-03-28 DIAGNOSIS — J45909 Unspecified asthma, uncomplicated: Secondary | ICD-10-CM | POA: Diagnosis not present

## 2020-03-28 DIAGNOSIS — E782 Mixed hyperlipidemia: Secondary | ICD-10-CM

## 2020-03-28 DIAGNOSIS — E1142 Type 2 diabetes mellitus with diabetic polyneuropathy: Secondary | ICD-10-CM

## 2020-03-28 DIAGNOSIS — F17210 Nicotine dependence, cigarettes, uncomplicated: Secondary | ICD-10-CM | POA: Diagnosis not present

## 2020-03-28 DIAGNOSIS — I480 Paroxysmal atrial fibrillation: Secondary | ICD-10-CM

## 2020-03-28 LAB — CBC
HCT: 44.3 % (ref 36.0–46.0)
Hemoglobin: 15.1 g/dL — ABNORMAL HIGH (ref 12.0–15.0)
MCH: 29.6 pg (ref 26.0–34.0)
MCHC: 34.1 g/dL (ref 30.0–36.0)
MCV: 86.9 fL (ref 80.0–100.0)
Platelets: 264 10*3/uL (ref 150–400)
RBC: 5.1 MIL/uL (ref 3.87–5.11)
RDW: 12.1 % (ref 11.5–15.5)
WBC: 11.9 10*3/uL — ABNORMAL HIGH (ref 4.0–10.5)
nRBC: 0 % (ref 0.0–0.2)

## 2020-03-28 LAB — BASIC METABOLIC PANEL
Anion gap: 17 — ABNORMAL HIGH (ref 5–15)
BUN: <5 mg/dL — ABNORMAL LOW (ref 6–20)
CO2: 21 mmol/L — ABNORMAL LOW (ref 22–32)
Calcium: 9.5 mg/dL (ref 8.9–10.3)
Chloride: 101 mmol/L (ref 98–111)
Creatinine, Ser: 0.71 mg/dL (ref 0.44–1.00)
GFR calc Af Amer: >60 mL/min (ref >60)
GFR calc non Af Amer: >60 mL/min (ref >60)
Glucose, Bld: 367 mg/dL — ABNORMAL HIGH (ref 70–99)
Potassium: 3.3 mmol/L — ABNORMAL LOW (ref 3.5–5.1)
Sodium: 139 mmol/L (ref 135–145)

## 2020-03-28 LAB — TROPONIN I (HIGH SENSITIVITY)
Troponin I (High Sensitivity): 4 ng/L (ref <18)
Troponin I (High Sensitivity): 13 ng/L (ref <18)

## 2020-03-28 MED ORDER — SODIUM CHLORIDE 0.9 % IV BOLUS
1000.0000 mL | Freq: Once | INTRAVENOUS | Status: AC
  Administered 2020-03-28: 1000 mL via INTRAVENOUS

## 2020-03-28 MED ORDER — INSULIN LISPRO 100 UNIT/ML ~~LOC~~ SOLN: 10.0000 [IU] | Freq: Three times a day (TID) | SUBCUTANEOUS | 0 refills | Status: DC

## 2020-03-28 MED ORDER — BASAGLAR KWIKPEN 100 UNIT/ML ~~LOC~~ SOPN: 40.0000 [IU] | PEN_INJECTOR | Freq: Every day | SUBCUTANEOUS | 0 refills | Status: DC

## 2020-03-28 MED ORDER — DILTIAZEM HCL ER COATED BEADS 120 MG PO CP24
120.0000 mg | ORAL_CAPSULE | Freq: Once | ORAL | Status: AC
  Administered 2020-03-28: 120 mg via ORAL
  Filled 2020-03-28: qty 1

## 2020-03-28 MED ORDER — ATORVASTATIN CALCIUM 40 MG PO TABS: 40.0000 mg | ORAL_TABLET | Freq: Every day | ORAL | 0 refills | Status: DC

## 2020-03-28 MED ORDER — DILTIAZEM HCL ER COATED BEADS 120 MG PO CP24: 120.0000 mg | ORAL_CAPSULE | Freq: Every day | ORAL | 0 refills | Status: DC

## 2020-03-28 MED ORDER — ADENOSINE 6 MG/2ML IV SOLN
INTRAVENOUS | Status: AC
  Filled 2020-03-28: qty 4

## 2020-03-28 MED ORDER — SODIUM CHLORIDE 0.9% FLUSH
3.0000 mL | Freq: Once | INTRAVENOUS | Status: AC
  Administered 2020-03-28: 3 mL via INTRAVENOUS

## 2020-03-28 NOTE — Telephone Encounter (Signed)
Pt called in while sitting in her car in a parking lot across from where she lives.   She tried to drive to [facility] herself however she got lightheaded and couldn't make it.  She called her son who is coming for her now.   "He is maybe 10 minutes away".   I really encouraged her to call 911.  She is c/o chest tightness, shortness of breath (which I could hear as she talked with me in short phrases),  Tingling going down her left arm, my heart is racing and my throat feels tight.   I felt like this last time. We got off the phone at this point.   5 minutes later at 12:40 PM I called her back and she answered right away.   She still sounded short of breath.   I instructed her to call 911 because she needs to get to the ED immediately.   She said her son "He is pulling in now".  I instructed her have her son get her to the ED as quickly as possible but to be careful and safe.   She said, ok".  They are going to [facility]. I checked her chart for her history and she has paroxysmal atrial fibrillation.     Her heart has been racing since Friday.   See notes below for more details.     my Reason for Disposition . [1] Dizziness, lightheadedness, or weakness AND [2] heart beating very rapidly (e.g., > 140 / minute)  Answer Assessment - Initial Assessment Questions 1. DESCRIPTION: "Please describe your heart rate or heartbeat that you are having" (e.g., fast/slow, regular/irregular, skipped or extra beats, "palpitations")     My heart is racing.   I didn't get to do my papers for my blue card and orange card.  So I couldn't get my medicines on Friday. 2. ONSET: "When did it start?" (Minutes, hours or days)      Friday of last week it started.  My heart is racing and short  Of breath.  Tingling in left arm and throat feels like it's closing.    She tried to drive to [facility] "but I couldn't make it because I got lightheaded".   "I'm sitting in the parking lot across from where I live". I  instructed her to call 911 that she needs to go on to the ED.   "My son is coming now to get me and take me to [facility]".   I asked her how far away he was and she said maybe 10 minutes.   I instructed her to call 911 from her cell phone if she starts getting any worse because she needs to get as soon as possible.  I went over the symptoms to call 911 immediately since she wanted to let her son take her.   Shortness of breath getting worse, chest tightness worse, tingling in left arm worse, dizzy, sweating.   She verbalized understanding.   "This is how I felt last time". 3. DURATION: "How long does it last" (e.g., seconds, minutes, hours)     Since Friday. 4. PATTERN "Does it come and go, or has it been constant since it started?"  "Does it get worse with exertion?"   "Are you feeling it now?"     *No Answer* 5. TAP: "Using your hand, can you tap out what you are feeling on a chair or table in front of you, so that I can hear?" (Note: not  all patients can do this)       *No Answer* 6. HEART RATE: "Can you tell me your heart rate?" "How many beats in 15 seconds?"  (Note: not all patients can do this)       Not asked.   She's sitting in the car in a parking lot waiting for her son. 7. RECURRENT SYMPTOM: "Have you ever had this before?" If Yes, ask: "When was the last time?" and "What happened that time?"      *No Answer* 8. CAUSE: "What do you think is causing the palpitations?"     I'm out of my medicines and they won't get me any more until I do the paperwork for my orange and blue cards.  I've been sick and haven't gotten the paperwork done yet. 9. CARDIAC HISTORY: "Do you have any history of heart disease?" (e.g., heart attack, angina, bypass surgery, angioplasty, arrhythmia)      *No Answer* 10. OTHER SYMPTOMS: "Do you have any other symptoms?" (e.g., dizziness, chest pain, sweating, difficulty breathing)       lightheaded 11. PREGNANCY: "Is there any chance you are pregnant?" "When was your last  menstrual period?"       Not asked  Protocols used: HEART RATE AND HEARTBEAT QUESTIONS-A-AH

## 2020-03-28 NOTE — ED Triage Notes (Signed)
Pt in w/tachycardia (200's), chest and jaw pain. Hx of Afib, states her doc's have been adjusting some of her meds, and she began to feel the palpitations this am, then pain. IV started in triage, EKG given to doc, awaiting room placement

## 2020-03-28 NOTE — ED Notes (Signed)
Pt brought back to room, EDP bero At bedside, pt connected to monitor and was converted into normal sinus rhythm at 106bpm. EKG captured and given to EDP

## 2020-03-28 NOTE — ED Provider Notes (Signed)
Pt signed out by Dr. Sedonia Small awaiting 2nd troponin.  Pt said she's been out of her meds since July 23rd.  There was apparently a problem with her orange card renewal at the [facility] and she was unable to pick up the meds.  2nd troponin nl.  Pt is feeling well.    Pt d/w the CM who will arrange for pt to get 1 month supply of her meds.    Pt is stable for d/c.  Return if worse.  F/u with pcp.   Isla Pence, MD 03/28/20 [phone]

## 2020-03-28 NOTE — ED Notes (Signed)
Pt given sandwich bag and drink

## 2020-03-28 NOTE — ED Provider Notes (Signed)
[facility] Emergency Department Provider Note MRN:  [id number]  Arrival date & time: 03/28/20     Chief Complaint   Tachycardia and Chest Pain   History of Present Illness   Anna Golden is a 59 y.o. year-old female with a history of diabetes, hypertension, paroxysmal A. fib presenting to the ED with chief complaint of tachycardia.  Sudden onset palpitations for the past few hours.  Found to be with a heart rate of 200 in triage.  Endorses a recent cold-like illness with nasal congestion.  Denies any shortness of breath, no leg pain or swelling.  No abdominal pain.  Her heart rate has now normalized to 100 and now she is endorsing left-sided chest pain, described as a pressure.  Review of Systems  A complete 10 system review of systems was obtained and all systems are negative except as noted in the HPI and PMH.   Patient's Health History    Past Medical History:  Diagnosis Date  . Acute asthma exacerbation 03/07/2014  . Arthritis   . Asthma   . DKA (diabetic ketoacidoses) ([place]) 03/07/2014  . DM (diabetes mellitus) ([place]) 04/24/2013  . Dysmenorrhea 01/23/2013  . Fatty liver disease, nonalcoholic   . Hypertension   . Leiomyoma of uterus, unspecified 01/23/2013  . Leukocytosis   . Menorrhagia with regular cycle 01/23/2013  . Obesity   . PAF (paroxysmal atrial fibrillation) ([place])    a. Dx 03/2013, spont converted to NSR.  Marland Kitchen Sickle cell trait ([place])   . Smoking 03/18/2014    Past Surgical History:  Procedure Laterality Date  . HERNIA REPAIR      Family History  Problem Relation Age of Onset  . Heart disease Mother   . Diabetes Mother   . Sickle cell anemia Mother   . Cancer Father        colon    Social History   Socioeconomic History  . Marital status: Legally Separated    Spouse name: Not on file  . Number of children: Not on file  . Years of education: Not on file  . Highest education level: Not on file  Occupational History  . Not on file   Tobacco Use  . Smoking status: Current Every Day Smoker    Packs/day: 0.25    Types: Cigarettes  . Smokeless tobacco: Never Used  Vaping Use  . Vaping Use: Never used  Substance and Sexual Activity  . Alcohol use: No  . Drug use: No  . Sexual activity: Yes  Other Topics Concern  . Not on file  Social History Narrative  . Not on file   Social Determinants of Health   Financial Resource Strain:   . Difficulty of Paying Living Expenses:   Food Insecurity:   . Worried About Charity fundraiser in the Last Year:   . Arboriculturist in the Last Year:   Transportation Needs:   . Film/video editor (Medical):   Marland Kitchen Lack of Transportation (Non-Medical):   Physical Activity:   . Days of Exercise per Week:   . Minutes of Exercise per Session:   Stress:   . Feeling of Stress :   Social Connections:   . Frequency of Communication with Friends and Family:   . Frequency of Social Gatherings with Friends and Family:   . Attends Religious Services:   . Active Member of Clubs or Organizations:   . Attends Archivist Meetings:   Marland Kitchen Marital Status:  Intimate Partner Violence:   . Fear of Current or Ex-Partner:   . Emotionally Abused:   Marland Kitchen Physically Abused:   . Sexually Abused:      Physical Exam   Vitals:   03/28/20 1326 03/28/20 1328  BP: 125/82   Pulse: (!) 104 (!) 109  Resp: 15 15  Temp:    SpO2: 97% 97%    CONSTITUTIONAL: Well-appearing, NAD NEURO:  Alert and oriented x 3, no focal deficits EYES:  eyes equal and reactive ENT/NECK:  no LAD, no JVD CARDIO: Tachycardic rate, well-perfused, normal S1 and S2 PULM:  CTAB no wheezing or rhonchi GI/GU:  normal bowel sounds, non-distended, non-tender MSK/SPINE:  No gross deformities, no edema SKIN:  no rash, atraumatic PSYCH:  Appropriate speech and behavior  *Additional and/or pertinent findings included in MDM below  Diagnostic and Interventional Summary    EKG Interpretation  Date/Time:  Monday March 28 2020 12:59:30 EDT Ventricular Rate:  201 PR Interval:    QRS Duration: 68 QT Interval:  216 QTC Calculation: 395 R Axis:   59 Text Interpretation: Supraventricular tachycardia Marked ST abnormality, possible inferior subendocardial injury Abnormal ECG Confirmed by Gerlene Fee [phone]) on 03/28/2020 1:44:45 PM       EKG Interpretation  Date/Time:  Monday March 28 2020 13:25:31 EDT Ventricular Rate:  106 PR Interval:    QRS Duration: 77 QT Interval:  338 QTC Calculation: 449 R Axis:   30 Text Interpretation: Sinus tachycardia Probable left atrial enlargement Low voltage, precordial leads Confirmed by Gerlene Fee [phone]) on 03/28/2020 1:45:53 PM         Labs Reviewed  BASIC METABOLIC PANEL - Abnormal; Notable for the following components:      Result Value   Potassium 3.3 (*)    CO2 21 (*)    Glucose, Bld 367 (*)    BUN <5 (*)    Anion gap 17 (*)    All other components within normal limits  CBC - Abnormal; Notable for the following components:   WBC 11.9 (*)    Hemoglobin 15.1 (*)    All other components within normal limits  I-STAT BETA HCG BLOOD, ED (MC, WL, AP ONLY)  TROPONIN I (HIGH SENSITIVITY)  TROPONIN I (HIGH SENSITIVITY)    DG Chest Portable 1 View  Final Result      Medications  adenosine (ADENOCARD) 6 MG/2ML injection (has no administration in time range)  sodium chloride 0.9 % bolus 1,000 mL (has no administration in time range)  sodium chloride flush (NS) 0.9 % injection 3 mL (3 mLs Intravenous Given 03/28/20 1327)  sodium chloride 0.9 % bolus 1,000 mL (1,000 mLs Intravenous New Bag/Given 03/28/20 1345)     Procedures  /  Critical Care .Critical Care Performed by: Maudie Flakes, MD Authorized by: Maudie Flakes, MD   Critical care provider statement:    Critical care time (minutes):  33   Critical care was necessary to treat or prevent imminent or life-threatening deterioration of the following conditions: Supraventricular tachycardia.    Critical care was time spent personally by me on the following activities:  Discussions with consultants, evaluation of patient's response to treatment, examination of patient, ordering and performing treatments and interventions, ordering and review of laboratory studies, ordering and review of radiographic studies, pulse oximetry, re-evaluation of patient's condition, obtaining history from patient or surrogate and review of old charts    ED Course and Medical Decision Making  I have reviewed the triage vital signs, the  nursing notes, and pertinent available records from the EMR.  Listed above are laboratory and imaging tests that I personally ordered, reviewed, and interpreted and then considered in my medical decision making (see below for details).      Supraventricular tachycardia evident on arrival with initial EKG, endorsing some chest discomfort as well.  While retrieving adenosine and obtaining IV access, patient spontaneously converted to normal sinus rhythm.  She feels better, no longer lightheaded, still having left-sided chest pain.  Given patient's cardiac risk factors, may have had some ischemic chest pain during her heart rate of 200.  Will check 2 troponins, monitor closely.  Doubt PE given lack of shortness of breath, no evidence of DVT, no hypoxia.  Patient is asymptomatic, first troponin is negative, awaiting second troponin anticipating discharge with cardiology follow-up.  Signed out to oncoming provider.  Barth Kirks. Sedonia Small, [place] [email or web address]  Final Clinical Impressions(s) / ED Diagnoses     ICD-10-CM   1. SVT (supraventricular tachycardia) (HCC)  I47.1     ED Discharge Orders    None       Discharge Instructions Discussed with and Provided to Patient:   Discharge Instructions   None       Maudie Flakes, MD 03/28/20 [phone]

## 2020-03-28 NOTE — Care Management (Signed)
  [place] Medication Assistance Card Name: Rickia, [place] (MRN): [id number] Pierz: [id number] RX Group: [id number] Discharge Date: 03/28/2020 Expiration Date: 04/11/2020                                           (must be filled within 7 days of discharge)       Dear   : Alver Sorrow  You have been approved to have the prescriptions written by your discharging physician filled through our [facility] (Medication Assistance Through [facility]) program. This program allows for a one-time (no refills) 34-day supply of selected medications for a low copay amount.  The copay is $3.00 per prescription. For instance, if you have one prescription, you will pay $3.00; for two prescriptions, you pay $6.00; for three prescriptions, you pay $9.00; and so on.  Only certain pharmacies are participating in this program with [facility]. You will need to select one of the pharmacies from the attached list and take your prescriptions, this letter, and your photo ID to one of the participating pharmacies.   We are excited that you are able to use the [facility] program to get your medications. These prescriptions must be filled within 7 days of hospital discharge or they will no longer be valid for the [facility] program. Should you have any problems with your prescriptions please contact your case management team member at [phone] for [place]/ [place] you, [place] Management

## 2020-05-04 ENCOUNTER — Ambulatory Visit: Payer: Self-pay | Admitting: Nurse Practitioner

## 2020-05-23 ENCOUNTER — Ambulatory Visit: Payer: Medicaid - Out of State

## 2020-05-30 ENCOUNTER — Ambulatory Visit: Payer: Medicaid - Out of State

## 2020-06-07 ENCOUNTER — Telehealth: Payer: Self-pay | Admitting: Nurse Practitioner

## 2020-06-07 NOTE — Telephone Encounter (Signed)
I call back the Pt, LVM that I was returning her call

## 2020-06-07 NOTE — Telephone Encounter (Signed)
Copied from [place] [phone]. Topic: Appointment Scheduling - Scheduling Inquiry for Clinic >> Jun 03, 2020 11:26 AM Scherrie Gerlach wrote: Reason for CRM: pt would like appt for the orange card.  She say she is out of heart medicine . >> Jun 07, 2020  2:19 PM Lennox Solders wrote: Pt is returning a call concerning orange card

## 2020-06-09 ENCOUNTER — Other Ambulatory Visit (HOSPITAL_COMMUNITY): Payer: Self-pay | Admitting: Emergency Medicine

## 2020-06-09 ENCOUNTER — Emergency Department (HOSPITAL_COMMUNITY)
Admission: EM | Admit: 2020-06-09 | Discharge: 2020-06-10 | Disposition: A | Discharge status: Home / Self Care | Payer: Medicaid - Out of State | Attending: Emergency Medicine | Admitting: Emergency Medicine

## 2020-06-09 ENCOUNTER — Encounter (HOSPITAL_COMMUNITY): Payer: Self-pay | Admitting: Emergency Medicine

## 2020-06-09 ENCOUNTER — Other Ambulatory Visit: Payer: Self-pay

## 2020-06-09 ENCOUNTER — Emergency Department (HOSPITAL_COMMUNITY): Payer: Medicaid - Out of State

## 2020-06-09 DIAGNOSIS — E1110 Type 2 diabetes mellitus with ketoacidosis without coma: Secondary | ICD-10-CM | POA: Diagnosis not present

## 2020-06-09 DIAGNOSIS — K4090 Unilateral inguinal hernia, without obstruction or gangrene, not specified as recurrent: Secondary | ICD-10-CM | POA: Insufficient documentation

## 2020-06-09 DIAGNOSIS — R0981 Nasal congestion: Secondary | ICD-10-CM | POA: Insufficient documentation

## 2020-06-09 DIAGNOSIS — M25551 Pain in right hip: Secondary | ICD-10-CM | POA: Insufficient documentation

## 2020-06-09 DIAGNOSIS — Z79899 Other long term (current) drug therapy: Secondary | ICD-10-CM | POA: Diagnosis not present

## 2020-06-09 DIAGNOSIS — R079 Chest pain, unspecified: Secondary | ICD-10-CM | POA: Diagnosis present

## 2020-06-09 DIAGNOSIS — K769 Liver disease, unspecified: Secondary | ICD-10-CM | POA: Insufficient documentation

## 2020-06-09 DIAGNOSIS — M549 Dorsalgia, unspecified: Secondary | ICD-10-CM | POA: Insufficient documentation

## 2020-06-09 DIAGNOSIS — Z794 Long term (current) use of insulin: Secondary | ICD-10-CM | POA: Diagnosis not present

## 2020-06-09 DIAGNOSIS — Z91040 Latex allergy status: Secondary | ICD-10-CM | POA: Insufficient documentation

## 2020-06-09 DIAGNOSIS — F17210 Nicotine dependence, cigarettes, uncomplicated: Secondary | ICD-10-CM | POA: Diagnosis not present

## 2020-06-09 DIAGNOSIS — I10 Essential (primary) hypertension: Secondary | ICD-10-CM | POA: Insufficient documentation

## 2020-06-09 DIAGNOSIS — R5383 Other fatigue: Secondary | ICD-10-CM | POA: Insufficient documentation

## 2020-06-09 DIAGNOSIS — R059 Cough, unspecified: Secondary | ICD-10-CM | POA: Diagnosis not present

## 2020-06-09 DIAGNOSIS — J45909 Unspecified asthma, uncomplicated: Secondary | ICD-10-CM | POA: Diagnosis not present

## 2020-06-09 DIAGNOSIS — M25552 Pain in left hip: Secondary | ICD-10-CM | POA: Diagnosis not present

## 2020-06-09 DIAGNOSIS — I480 Paroxysmal atrial fibrillation: Secondary | ICD-10-CM

## 2020-06-09 DIAGNOSIS — K469 Unspecified abdominal hernia without obstruction or gangrene: Secondary | ICD-10-CM

## 2020-06-09 LAB — BASIC METABOLIC PANEL
Anion gap: 11 (ref 5–15)
BUN: 9 mg/dL (ref 6–20)
CO2: 27 mmol/L (ref 22–32)
Calcium: 9.2 mg/dL (ref 8.9–10.3)
Chloride: 102 mmol/L (ref 98–111)
Creatinine, Ser: 0.64 mg/dL (ref 0.44–1.00)
GFR, Estimated: >60 mL/min (ref >60)
Glucose, Bld: 211 mg/dL — ABNORMAL HIGH (ref 70–99)
Potassium: 3 mmol/L — ABNORMAL LOW (ref 3.5–5.1)
Sodium: 140 mmol/L (ref 135–145)

## 2020-06-09 LAB — CBC
HCT: 39.7 % (ref 36.0–46.0)
Hemoglobin: 13.5 g/dL (ref 12.0–15.0)
MCH: 29.5 pg (ref 26.0–34.0)
MCHC: 34 g/dL (ref 30.0–36.0)
MCV: 86.9 fL (ref 80.0–100.0)
Platelets: 272 10*3/uL (ref 150–400)
RBC: 4.57 MIL/uL (ref 3.87–5.11)
RDW: 12.2 % (ref 11.5–15.5)
WBC: 12.6 10*3/uL — ABNORMAL HIGH (ref 4.0–10.5)
nRBC: 0 % (ref 0.0–0.2)

## 2020-06-09 LAB — I-STAT BETA HCG BLOOD, ED (MC, WL, AP ONLY): I-stat hCG, quantitative: <5 m[IU]/mL (ref <5)

## 2020-06-09 LAB — TROPONIN I (HIGH SENSITIVITY): Troponin I (High Sensitivity): 4 ng/L (ref <18)

## 2020-06-09 MED ORDER — DILTIAZEM HCL ER COATED BEADS 120 MG PO CP24: 120.0000 mg | ORAL_CAPSULE | Freq: Every day | ORAL | 0 refills | Status: DC

## 2020-06-09 MED ORDER — MAGNESIUM OXIDE -MG SUPPLEMENT 250 MG PO TABS
1.0000 | ORAL_TABLET | Freq: Every day | ORAL | 0 refills | Status: AC
Start: 2020-06-09 — End: 2020-06-30

## 2020-06-09 MED ORDER — POTASSIUM CHLORIDE ER 10 MEQ PO TBCR: 10.0000 meq | EXTENDED_RELEASE_TABLET | Freq: Every day | ORAL | 0 refills | Status: DC

## 2020-06-09 NOTE — ED Triage Notes (Signed)
Pt c/o chest and abd pain for the past few days.

## 2020-06-09 NOTE — Discharge Instructions (Addendum)
Please call to make a follow-up appointment with the gastroenterologist for your liver issues and for your colonoscopy.  Please call the general surgeons to make a follow-up appointment for the painful "lump" in your abdomen.  I think this may be hernia that can be managed by general surgery.  Your potassium was a little low today.  I prescribed potassium, as well as magnesium to take at home for the next 3 weeks.  You can have your primary care doctor recheck your levels at the end of the month.  Please [facility]  You should get these prescriptions before the weekend, as they are closed on the weekend.

## 2020-06-09 NOTE — ED Provider Notes (Signed)
[facility] EMERGENCY DEPARTMENT Provider Note   CSN: [id number] Arrival date & time: 06/09/20  2009     History Chief Complaint  Patient presents with  . Chest Pain    Anna Golden is a 59 y.o. female history diabetes, fatty liver, obesity, presenting to emergency department with abdominal pain and back pain.  The patient reports has had generalized fatigue for nearly a month.  She describes "cold symptoms" with a cough, congestion ongoing for a month.  She also reports has had low energy for the past few days and couldn't get out of bed for work today.  She describes a painful "knot" in her abdomen that she has had for many months, worse with sitting up, better with lying back, there is sharp pain that radiates around towards her back.  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  She also describes feeling fatigued.  She separately tells me that she was evaluated as an outpatient for hepatitis and told that she is hepatitis C.  She has no history of IV drug use.  She does not know where she may have acquired it from.  She reports she did get a blood transfusion when she was living in [place] because she had a low blood count, but only once.  Per care everywhere records, it appears that the patient had positive hepatitis C antibodies, but the hepatitis C RNA QuantiFERON was negative.  She has not seen a GI doctor for this.  She also tells me that she had a colonoscopy many years ago while she living in [place], and was told that she needs another colonoscopy "in 2 years".  She said this was because she had multiple polyps.  She was never able to do this.  She also tells me that she ran out of some of her medications, more specifically her diltiazem.  She still has insulin at home.  HPI     Past Medical History:  Diagnosis Date  . Acute asthma exacerbation 03/07/2014  . Arthritis   . Asthma     . DKA (diabetic ketoacidoses) 03/07/2014  . DM (diabetes mellitus) ([place]) 04/24/2013  . Dysmenorrhea 01/23/2013  . Fatty liver disease, nonalcoholic   . Hypertension   . Leiomyoma of uterus, unspecified 01/23/2013  . Leukocytosis   . Menorrhagia with regular cycle 01/23/2013  . Obesity   . PAF (paroxysmal atrial fibrillation) ([place])    a. Dx 03/2013, spont converted to NSR.  Marland Kitchen Sickle cell trait ([place])   . Smoking 03/18/2014    Patient Active Problem List   Diagnosis Date Noted  . Educated about COVID-19 virus infection 08/29/2019  . Leukocytosis   . Smoking 03/18/2014  . Arthritis 03/18/2014  . DKA (diabetic ketoacidoses) 03/07/2014  . Acute asthma exacerbation 03/07/2014  . DM (diabetes mellitus) ([place]) 04/24/2013  . Hypertension 04/24/2013  . Obesity 04/24/2013  . Leiomyoma of uterus, unspecified 01/23/2013  . Dysmenorrhea 01/23/2013  . Menorrhagia with regular cycle 01/23/2013    Past Surgical History:  Procedure Laterality Date  . HERNIA REPAIR       OB History    Gravida  6   Para  4   Term  1   Preterm  3   AB  2   Living  3     SAB  2   TAB      Ectopic  Multiple      Live Births  3           Family History  Problem Relation Age of Onset  . Heart disease Mother   . Diabetes Mother   . Sickle cell anemia Mother   . Cancer Father        colon    Social History   Tobacco Use  . Smoking status: Current Every Day Smoker    Packs/day: 0.25    Types: Cigarettes  . Smokeless tobacco: Never Used  Vaping Use  . Vaping Use: Never used  Substance Use Topics  . Alcohol use: No  . Drug use: No    Home Medications Prior to Admission medications   Medication Sig Start Date End Date Taking? Authorizing Provider  acetaminophen (TYLENOL) 650 MG CR tablet Take 650 mg by mouth every 8 (eight) hours as needed for pain.    [provider]  atorvastatin (LIPITOR) 40 MG tablet Take 1 tablet (40 mg total) by mouth daily. 03/28/20 04/27/20   Isla Pence, MD  Calcium Carbonate-Vitamin D (CALCIUM 500 + D PO) Take 1 tablet by mouth daily.     [provider]  diltiazem (CARDIZEM CD) 120 MG 24 hr capsule Take 1 capsule (120 mg total) by mouth daily. 06/09/20 07/09/20  Wyvonnia Dusky, MD  DULoxetine (CYMBALTA) 30 MG capsule Take 1 capsule (30 mg total) by mouth daily. Patient not taking: Reported on 03/28/2020 10/06/19 11/05/19  Gildardo Pounds, NP  Insulin Glargine [facility]) 100 UNIT/ML Inject 0.4 mLs (40 Units total) into the skin daily. 03/28/20 04/27/20  Isla Pence, MD  insulin lispro (HUMALOG) 100 UNIT/ML injection Inject 0.1 mLs (10 Units total) into the skin 3 (three) times daily before meals. 03/28/20 04/27/20  Isla Pence, MD  Insulin Pen Needle (B-D UF III MINI PEN NEEDLES) 31G X 5 MM MISC Use as instructed. Inject into the skin four times per day. 07/28/19   Gildardo Pounds, NP  Insulin Syringes, Disposable, U-100 0.3 ML MISC Use as instructed. Inject into the skin once three times per day. 03/14/20   Gildardo Pounds, NP  Liniments (PAIN RELIEF EX) Apply 1 application topically 4 (four) times daily as needed (pain).    [provider]  lisinopril (ZESTRIL) 2.5 MG tablet Take 1 tablet (2.5 mg total) by mouth daily. Patient not taking: Reported on 10/06/2019 07/28/19 10/26/19  Gildardo Pounds, NP  Magnesium Oxide -Mg Supplement 250 MG TABS Take 1 tablet (250 mg total) by mouth daily for 21 days. 06/09/20 06/30/20  Wyvonnia Dusky, MD  omeprazole (PRILOSEC) 20 MG capsule Take 1 capsule (20 mg total) by mouth 2 (two) times daily before a meal. Patient not taking: Reported on 03/28/2020 07/28/19 08/27/19  Gildardo Pounds, NP  potassium chloride (KLOR-CON) 10 MEQ tablet Take 1 tablet (10 mEq total) by mouth daily for 21 days. 06/09/20 06/30/20  Wyvonnia Dusky, MD  insulin aspart protamine- aspart (NOVOLOG MIX 70/30) (70-30) 100 UNIT/ML injection Inject 0.08 mLs (8 Units total) into the skin 2 (two) times daily  with a meal. Patient not taking: Reported on 01/15/2015 04/26/14 01/26/15  Lorayne Marek, MD    Allergies    Metoprolol, Other, Latex, and Pork-derived products  Review of Systems   Review of Systems  Constitutional: Positive for appetite change and fatigue. Negative for chills and fever.  HENT: Negative for ear pain and sore throat.   Eyes: Negative for pain and visual disturbance.  Respiratory: Negative for cough and shortness of breath.   Cardiovascular: Negative for chest pain and palpitations.  Gastrointestinal: Positive for abdominal pain and nausea. Negative for vomiting.  Genitourinary: Negative for dysuria and hematuria.  Musculoskeletal: Positive for arthralgias, back pain and myalgias.  Skin: Negative for color change and rash.  Neurological: Negative for syncope and headaches.  Psychiatric/Behavioral: Negative for agitation and confusion.  All other systems reviewed and are negative.   Physical Exam Updated Vital Signs BP 129/83 (BP Location: Right Arm)   Pulse 86   Temp 98.2 F (36.8 C) (Oral)   Resp 16   Ht 5\' 3"  (1.6 m)   Wt 82.6 kg   LMP 12/20/2014   SpO2 99%   BMI 32.26 kg/m   Physical Exam Vitals and nursing note reviewed.  Constitutional:      General: She is not in acute distress.    Appearance: She is well-developed.  HENT:     Head: Normocephalic and atraumatic.  Eyes:     Conjunctiva/sclera: Conjunctivae normal.  Cardiovascular:     Rate and Rhythm: Normal rate and regular rhythm.     Heart sounds: No murmur heard.   Pulmonary:     Effort: Pulmonary effort is normal. No respiratory distress.     Breath sounds: Normal breath sounds.  Abdominal:     Palpations: Abdomen is soft.     Tenderness: There is no abdominal tenderness.     Comments: Small ventral hernia palpable in in mid-right abdomen, easily reducible  Musculoskeletal:     Cervical back: Normal range of motion and neck supple.  Skin:    General: Skin is warm and dry.      Comments: No jaundice  Neurological:     General: No focal deficit present.     Mental Status: She is alert and oriented to person, place, and time.  Psychiatric:        Mood and Affect: Mood normal.        Behavior: Behavior normal.     ED Results / Procedures / Treatments   Labs (all labs ordered are listed, but only abnormal results are displayed) Labs Reviewed  BASIC METABOLIC PANEL - Abnormal; Notable for the following components:      Result Value   Potassium 3.0 (*)    Glucose, Bld 211 (*)    All other components within normal limits  CBC - Abnormal; Notable for the following components:   WBC 12.6 (*)    All other components within normal limits  HEPATIC FUNCTION PANEL - Abnormal; Notable for the following components:   Alkaline Phosphatase 34 (*)    All other components within normal limits  RESPIRATORY PANEL BY RT PCR (FLU A&B, COVID)  I-STAT BETA HCG BLOOD, ED ([facility], [facility], [facility] ONLY)  TROPONIN I (HIGH SENSITIVITY)  TROPONIN I (HIGH SENSITIVITY)    EKG EKG Interpretation  Date/Time:  Thursday June 09 2020 20:34:49 EDT Ventricular Rate:  81 PR Interval:  148 QRS Duration: 74 QT Interval:  402 QTC Calculation: 466 R Axis:   46 Text Interpretation: Normal sinus rhythm Normal ECG No STEMI Confirmed by Octaviano Glow [id number]) on 06/09/2020 10:50:17 PM   Radiology DG Chest 2 View  Result Date: 06/09/2020 CLINICAL DATA:  Chest pain EXAM: CHEST - 2 VIEW COMPARISON:  None. FINDINGS: The heart size and mediastinal contours are within normal limits. Both lungs are clear. The visualized skeletal structures are unremarkable. IMPRESSION: No active cardiopulmonary disease. Electronically Signed   By: Lennette Bihari  [place] M.D.   On: 06/09/2020 20:52    Procedures Procedures (including critical care time)  Medications Ordered in ED Medications - No data to display  ED Course  I have reviewed the triage vital signs and the nursing notes.  Pertinent labs & imaging results  that were available during my care of the patient were reviewed by me and considered in my medical decision making (see chart for details).  59 yo female presenting with multiple concerns from home, namely fatigue and weakness, "viral-type" syndrome for the past month.    She has been repeatedly tested for HIV as an outpatient (negative) after a skin-exposure to the blood of a patient infected with HIV many years ago.  She has no hx of IVDA.  I doubt this is HIV with so many negative tests performed.  She has likely chronic hepatitis C from her outpt antibody testing last year.  Her viral RNA level was undetectable.  Today she has no signs of jaundice or scleral icterus to suggest acute liver failure.  I will refer her to GI regardless, as this can be monitored by a GI specialist.  She is also likely due for a colonoscopy as she was told she had multiple polyps last time and she needs frequent colonoscopies.  We cannot exclude the possibility of an underlying malignancy contributing to her symptoms.  Regarding her "knot" in the abdomen, I believe this is a small hernia.  It is only palpable when she sits upright, and is soft and easily reducible.  I doubt it is incarcerated.  I would refer her to general surgery for this.  I reviewed her labs here - BMP with mild hypokalemia (we'll prescribe K for home), CBC with WBC 12.6, hgb normal, LFT's unremarkable .  Trop is flat 4->5, doubt this is ACS.  ECG per my interpretation with no acute ischemic findings.  Xray chest unremarkable.    She was triaged as having chest pain but upon clarification this appears to be referred from her abdomen.  I have a lower suspicion for PE, ACS, PNA, at this time.  We can refill her diltiazem as well as she ran out.    Final Clinical Impression(s) / ED Diagnoses Final diagnoses:  Abdominal hernia without obstruction and without gangrene, recurrence not specified, unspecified hernia type  Liver problem  Other fatigue      Rx / DC Orders ED Discharge Orders         Ordered    Ambulatory referral to Gastroenterology       Comments: Need for colonoscopy, and for chronic hepatitis C management   06/09/20 2346    diltiazem (CARDIZEM CD) 120 MG 24 hr capsule  Daily        06/09/20 2346    potassium chloride (KLOR-CON) 10 MEQ tablet  Daily        06/09/20 2348    Magnesium Oxide -Mg Supplement 250 MG TABS  Daily        06/09/20 2348           Wyvonnia Dusky, MD 06/10/20 1006

## 2020-06-10 LAB — HEPATIC FUNCTION PANEL
ALT: 16 U/L (ref 0–44)
AST: 18 U/L (ref 15–41)
Albumin: 3.5 g/dL (ref 3.5–5.0)
Alkaline Phosphatase: 34 U/L — ABNORMAL LOW (ref 38–126)
Bilirubin, Direct: <0.1 mg/dL (ref 0.0–0.2)
Total Bilirubin: 0.5 mg/dL (ref 0.3–1.2)
Total Protein: 6.9 g/dL (ref 6.5–8.1)

## 2020-06-10 LAB — TROPONIN I (HIGH SENSITIVITY): Troponin I (High Sensitivity): 5 ng/L (ref <18)

## 2020-06-10 MED FILL — POTASSIUM CHLORIDE ER 10 ME: 10 | 21 days supply | Qty: 21 | Fill #0

## 2020-06-10 MED FILL — DILTIAZEM 24HR ER 120 MG CA: 120 | 30 days supply | Qty: 30 | Fill #5

## 2020-06-15 ENCOUNTER — Ambulatory Visit: Payer: Medicaid - Out of State | Attending: Nurse Practitioner

## 2020-06-15 ENCOUNTER — Other Ambulatory Visit: Payer: Self-pay

## 2020-06-24 ENCOUNTER — Ambulatory Visit: Payer: Medicaid - Out of State

## 2020-06-29 ENCOUNTER — Ambulatory Visit: Payer: Medicaid - Out of State

## 2020-07-03 NOTE — Progress Notes (Deleted)
   Subjective:    Patient ID: Anna Golden, female    DOB: [birth year], 59 y.o.   MRN: [id number]  59 y.o.F here to est care.   Hx of Asthma, HTN, T2DM,   07/04/20:      Review of Systems     Objective:   Physical Exam        Assessment & Plan:

## 2020-07-04 ENCOUNTER — Ambulatory Visit: Payer: Medicaid - Out of State | Admitting: Critical Care Medicine

## 2020-07-06 ENCOUNTER — Encounter: Payer: Medicaid - Out of State | Admitting: Critical Care Medicine

## 2020-07-06 NOTE — Progress Notes (Deleted)
   Subjective:    Patient ID: Anna Golden, female    DOB: Jan 26, 1961, 59 y.o.   MRN: [id number]  59 y.o.F here to est care.   Hx of Asthma, HTN, T2DM,       Review of Systems     Objective:   Physical Exam        Assessment & Plan:

## 2020-07-12 ENCOUNTER — Ambulatory Visit: Payer: Medicaid - Out of State

## 2020-07-19 ENCOUNTER — Ambulatory Visit: Payer: Self-pay | Attending: Critical Care Medicine

## 2020-07-19 ENCOUNTER — Other Ambulatory Visit: Payer: Self-pay

## 2020-07-25 MED FILL — DILTIAZEM 24HR ER 120 MG CA: 120 | 30 days supply | Qty: 30 | Fill #6

## 2020-07-26 ENCOUNTER — Telehealth: Payer: Self-pay | Admitting: Critical Care Medicine

## 2020-07-26 NOTE — Telephone Encounter (Signed)
I call the pt since received an email from [employer], that show on her paystub that has direct deposit,  And they Need 90 days bank statements, hold for 14 days, LVM to the Pt inform this

## 2020-07-31 NOTE — Progress Notes (Signed)
Subjective:    Patient ID: Anna Golden, female    DOB: 04-Jul-1961, 59 y.o.   MRN: [id number]  This is a 59 year old female who comes in to reestablish to the health and wellness clinic she is not been seen since February of this year.  Prior history of diabetes type 2 hypertension asthma history in the past of paroxysmal atrial fibrillation which is now on chronic normal sinus rhythm.  Previously on anticoagulation but has been off since August 2020 and has yet to see cardiology in follow-up and referral then was made.  Active smoking use is noted.  Recently fell injuring the right hip and lower back.  She has been off insulin products since February of this year.  On arrival A1c is greater than 9 blood glucose greater than 200.  She does note polydipsia and polyphagia.  Notes abdominal distention as well.  She had been on the Pradaxa previously and also Xarelto previously.  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  Note patient has refused to receive flu vaccine in the past and refuses today  The patient has received her Covid vaccine and is fully vaccinated  Patient complains of a rash on the back  Past Medical History:  Diagnosis Date  . Acute asthma exacerbation 03/07/2014  . Arthritis   . Asthma   . DKA (diabetic ketoacidoses) 03/07/2014  . DM (diabetes mellitus) ([place]) 04/24/2013  . Dysmenorrhea 01/23/2013  . Fatty liver disease, nonalcoholic   . Hypertension   . Leiomyoma of uterus, unspecified 01/23/2013  . Leukocytosis   . Menorrhagia with regular cycle 01/23/2013  . Obesity   . PAF (paroxysmal atrial fibrillation) ([place])    a. Dx 03/2013, spont converted to NSR.  Marland Kitchen Sickle  cell trait ([place])   . Smoking 03/18/2014     Family History  Problem Relation Age of Onset  . Heart disease Mother   . Diabetes Mother   . Sickle cell anemia Mother   . Cancer Father        colon     Social History   Socioeconomic History  . Marital status: Legally Separated    Spouse name: Not on file  . Number of children: Not on file  . Years of education: Not on file  . Highest education level: Not on file  Occupational History  . Not on file  Tobacco Use  . Smoking status: Current Every Day Smoker    Packs/day: 0.25    Types: Cigarettes  . Smokeless tobacco: Never Used  Vaping Use  . Vaping Use: Never used  Substance and Sexual Activity  . Alcohol use: No  . Drug use: No  . Sexual activity: Yes  Other Topics Concern  . Not on file  Social History Narrative  . Not on file   Social Determinants of Health   Financial Resource Strain:   . Difficulty of Paying Living Expenses: Not on file  Food Insecurity:   . Worried About Charity fundraiser in the Last Year: Not on file  . Ran Out of Food in the Last Year: Not on file  Transportation Needs:   . Lack of Transportation (Medical):  Not on file  . Lack of Transportation (Non-Medical): Not on file  Physical Activity:   . Days of Exercise per Week: Not on file  . Minutes of Exercise per Session: Not on file  Stress:   . Feeling of Stress : Not on file  Social Connections:   . Frequency of Communication with Friends and Family: Not on file  . Frequency of Social Gatherings with Friends and Family: Not on file  . Attends Religious Services: Not on file  . Active Member of Clubs or Organizations: Not on file  . Attends Archivist Meetings: Not on file  . Marital Status: Not on file  Intimate Partner Violence:   . Fear of Current or Ex-Partner: Not on file  . Emotionally Abused: Not on file  . Physically Abused: Not on file  . Sexually Abused: Not on file     Allergies  Allergen Reactions  .  Metoprolol Other (See Comments)    "Patient slept for whole day after starting medication this week.  Patient does not want to take this med.  Not sure if med causes hypotension, but could not get out of bed for any reason"  . Other Other (See Comments)    ANESTHESIA-CAUSES CARDIAC ARREST AND LUNGS COLLAPSED IN 1988, 2008.    . Latex Itching    Swell up  . Pork-Derived Products Palpitations and Other (See Comments)    Also raises Blood pressure-Patient avoids pork and pork products     Outpatient Medications Prior to Visit  Medication Sig Dispense Refill  . acetaminophen (TYLENOL) 650 MG CR tablet Take 650 mg by mouth every 8 (eight) hours as needed for pain.    Marland Kitchen diltiazem (CARDIZEM CD) 120 MG 24 hr capsule Take 1 capsule (120 mg total) by mouth daily. 30 capsule 0  . Calcium Carbonate-Vitamin D (CALCIUM 500 + D PO) Take 1 tablet by mouth daily.     . Liniments (PAIN RELIEF EX) Apply 1 application topically 4 (four) times daily as needed (pain).    Marland Kitchen atorvastatin (LIPITOR) 40 MG tablet Take 1 tablet (40 mg total) by mouth daily. (Patient not taking: Reported on 08/01/2020) 30 tablet 0  . DULoxetine (CYMBALTA) 30 MG capsule Take 1 capsule (30 mg total) by mouth daily. (Patient not taking: Reported on 03/28/2020) 30 capsule 2  . Insulin Glargine (BASAGLAR KWIKPEN) 100 UNIT/ML Inject 0.4 mLs (40 Units total) into the skin daily. (Patient not taking: Reported on 08/01/2020) 12 mL 0  . insulin lispro (HUMALOG) 100 UNIT/ML injection Inject 0.1 mLs (10 Units total) into the skin 3 (three) times daily before meals. (Patient not taking: Reported on 08/01/2020) 9 mL 0  . Insulin Pen Needle (B-D UF III MINI PEN NEEDLES) 31G X 5 MM MISC Use as instructed. Inject into the skin four times per day. 100 each 3  . Insulin Syringes, Disposable, U-100 0.3 ML MISC Use as instructed. Inject into the skin once three times per day. 100 each 3  . lisinopril (ZESTRIL) 2.5 MG tablet Take 1 tablet (2.5 mg total) by mouth  daily. (Patient not taking: Reported on 10/06/2019) 90 tablet 0  . omeprazole (PRILOSEC) 20 MG capsule Take 1 capsule (20 mg total) by mouth 2 (two) times daily before a meal. (Patient not taking: Reported on 03/28/2020) 60 capsule 3  . potassium chloride (KLOR-CON) 10 MEQ tablet Take 1 tablet (10 mEq total) by mouth daily for 21 days. (Patient not taking: Reported on 08/01/2020) 21 tablet 0  No facility-administered medications prior to visit.      Review of Systems  Constitutional: Positive for appetite change and fatigue.  HENT: Positive for hearing loss. Negative for congestion, ear discharge, ear pain, sinus pressure, sinus pain, sneezing, sore throat, tinnitus, trouble swallowing and voice change.   Eyes: Positive for visual disturbance.  Respiratory: Negative.   Cardiovascular: Positive for palpitations. Negative for chest pain and leg swelling.  Gastrointestinal: Positive for abdominal distention.       Abd hernia  Endocrine: Positive for cold intolerance, heat intolerance, polydipsia and polyuria. Negative for polyphagia.  Genitourinary: Negative for dysuria, enuresis, flank pain, vaginal discharge and vaginal pain.  Musculoskeletal: Positive for back pain.  Skin: Positive for rash.       Rash on back  Neurological: Positive for dizziness, syncope, weakness, light-headedness, numbness and headaches. Negative for tremors.  Psychiatric/Behavioral: Negative.        Objective:   Physical Exam  Vitals:   08/01/20 1022  BP: 130/87  Pulse: 89  Resp: 16  Temp: 98.6 F (37 C)  TempSrc: Oral  SpO2: 99%  Weight: 173 lb (78.5 kg)  Height: 5' 3.5" (1.613 m)    Gen: Pleasant, well-nourished, in no distress,  normal affect  ENT: No lesions,  mouth clear,  oropharynx clear, no postnasal drip, poor dentition  Neck: No JVD, no TMG, no carotid bruits  Lungs: No use of accessory muscles, no dullness to percussion, clear without rales or rhonchi  Cardiovascular: RRR, heart sounds  normal, no murmur or gallops, no peripheral edema  Abdomen: soft and NT, no HSM,  BS normal, right rectus muscle hernia that is reducible  Musculoskeletal: No deformities, no cyanosis or clubbing  Neuro: alert, non focal  Skin: Warm, folliculitis upper back No results found.       Assessment & Plan:  I personally reviewed all images and lab data in the [facility] system as well as any outside material available during this office visit and agree with the  radiology impressions.   Hypertension Hypertension well controlled on diltiazem will not renew lisinopril she has been off this for some time  Poorly controlled type 2 diabetes mellitus with peripheral neuropathy (HCC) Poorly controlled type 2 diabetes A1c greater than 9 off all insulin products  Will resume insulin Basaglar 40 units at bedtime and Humalog 5 units twice daily with the 2 largest meals of the day  Insulin needles and glucose testing supplies given to patient  Patient given instruction as to the self injection process of the insulin products by clinical pharmacist  Return to see clinical pharmacist in 2 weeks Dr. Joya Gaskins in 1 month  Arthritis Chronic arthritis and now lumbar back pain and right hip pain  Obtain x-rays of the right hip and lower back  Apply Voltaren gel topically to the back and begin gabapentin 300 mg 3 times daily  Obesity Obesity with BMI of 30  Appropriate diabetic diet given  Smoking    . Current smoking consumption amount: 4 cigarettes daily  . Dicsussion on advise to quit smoking and smoking impacts: Cardiovascular health impacts  . Patient's willingness to quit: Willing to quit  . Methods to quit smoking discussed: Behavioral modification nicotine replacement  . Medication management of smoking session drugs discussed: Nicotine patches  . Resources provided:  AVS   . Setting quit date not established  . Follow-up arranged 1 month   Time spent counseling the patient: 5  minutes   History of atrial fibrillation Appear to  be paroxysmal in the past now in sinus rhythm was on anticoagulation now off  Referral to cardiology  Folliculitis Topical BenzaClin given   Anna Golden was seen today for annual exam.  Diagnoses and all orders for this visit:  Poorly controlled type 2 diabetes mellitus with peripheral neuropathy (HCC) -     Glucose (CBG), Fasting -     HgB A1c -     Microalbumin / creatinine urine ratio -     Comprehensive metabolic panel -     CBC with Differential/Platelet -     Insulin Pen Needle (B-D UF III MINI PEN NEEDLES) 31G X 5 MM MISC; Use as instructed. Inject into the skin four times per day.  Mixed hyperlipidemia -     atorvastatin (LIPITOR) 40 MG tablet; Take 1 tablet (40 mg total) by mouth daily.  PAF (paroxysmal atrial fibrillation) (HCC) -     diltiazem (CARDIZEM CD) 120 MG 24 hr capsule; Take 1 capsule (120 mg total) by mouth daily.  Colon cancer screening -     Fecal occult blood, imunochemical  Encounter for screening mammogram for malignant neoplasm of breast -     MM DIGITAL SCREENING BILATERAL; Future  Chronic right-sided low back pain without sciatica -     DG Lumbar Spine Complete; Future  Primary hypertension  Type 1 diabetes mellitus with hyperglycemia (HCC)  Arthritis  Class 1 obesity due to excess calories with serious comorbidity and body mass index (BMI) of 30.0 to 30.9 in adult  Smoking  History of atrial fibrillation  Folliculitis  Other orders -     potassium chloride (KLOR-CON) 10 MEQ tablet; Take 1 tablet (10 mEq total) by mouth daily for 21 days. -     Insulin Glargine (BASAGLAR KWIKPEN) 100 UNIT/ML; Inject 40 Units into the skin daily. -     insulin lispro (HUMALOG) 100 UNIT/ML KwikPen; 5 units twice a day with two largest meals of the day -     Blood Glucose Monitoring Suppl (TRUE METRIX METER) w/Device KIT; Use to measure blood sugar twice a day -     TRUEplus Lancets 28G MISC; Use to  measure blood sugar twice a day -     glucose blood (TRUE METRIX BLOOD GLUCOSE TEST) test strip; Use as instructed -     nicotine (NICODERM CQ - DOSED IN MG/24 HOURS) 21 mg/24hr patch; Place 1 patch (21 mg total) onto the skin daily. -     diclofenac Sodium (VOLTAREN) 1 % GEL; Apply 4 g topically 4 (four) times daily. -     gabapentin (NEURONTIN) 300 MG capsule; Take 1 capsule (300 mg total) by mouth 3 (three) times daily. -     clindamycin-benzoyl peroxide (BENZACLIN) gel; Apply topically 2 (two) times daily. To affected areas of the back -     Pneumococcal polysaccharide vaccine 23-valent greater than or equal to 2yo subcutaneous/IM -     Flu Vaccine QUAD 6+ mos PF IM (Fluarix Quad PF)   Will obtain for the patient Pneumovax and flu vaccine this visit  Patient has had Covid vaccine already will come back for tetanus vaccine at a later date  BenzaClin given for folliculitis on the back topically  Referral to cardiology made to determine status of need for anticoagulation with history of atrial fibrillation now in sinus rhythm

## 2020-08-01 ENCOUNTER — Other Ambulatory Visit: Payer: Self-pay | Admitting: Critical Care Medicine

## 2020-08-01 ENCOUNTER — Ambulatory Visit: Payer: Self-pay | Attending: Critical Care Medicine | Admitting: Critical Care Medicine

## 2020-08-01 ENCOUNTER — Encounter: Payer: Self-pay | Admitting: Critical Care Medicine

## 2020-08-01 ENCOUNTER — Other Ambulatory Visit: Payer: Self-pay

## 2020-08-01 VITALS — BP 130/87 | HR 89 | Temp 98.6°F | Resp 16 | Ht 63.5 in | Wt 173.0 lb

## 2020-08-01 DIAGNOSIS — L739 Follicular disorder, unspecified: Secondary | ICD-10-CM | POA: Insufficient documentation

## 2020-08-01 DIAGNOSIS — E782 Mixed hyperlipidemia: Secondary | ICD-10-CM

## 2020-08-01 DIAGNOSIS — E1140 Type 2 diabetes mellitus with diabetic neuropathy, unspecified: Secondary | ICD-10-CM | POA: Insufficient documentation

## 2020-08-01 DIAGNOSIS — Z6830 Body mass index (BMI) 30.0-30.9, adult: Secondary | ICD-10-CM | POA: Insufficient documentation

## 2020-08-01 DIAGNOSIS — M25551 Pain in right hip: Secondary | ICD-10-CM | POA: Insufficient documentation

## 2020-08-01 DIAGNOSIS — M545 Low back pain, unspecified: Secondary | ICD-10-CM | POA: Insufficient documentation

## 2020-08-01 DIAGNOSIS — R21 Rash and other nonspecific skin eruption: Secondary | ICD-10-CM | POA: Insufficient documentation

## 2020-08-01 DIAGNOSIS — E1065 Type 1 diabetes mellitus with hyperglycemia: Secondary | ICD-10-CM

## 2020-08-01 DIAGNOSIS — J45909 Unspecified asthma, uncomplicated: Secondary | ICD-10-CM | POA: Insufficient documentation

## 2020-08-01 DIAGNOSIS — Z23 Encounter for immunization: Secondary | ICD-10-CM

## 2020-08-01 DIAGNOSIS — Z1231 Encounter for screening mammogram for malignant neoplasm of breast: Secondary | ICD-10-CM

## 2020-08-01 DIAGNOSIS — F17210 Nicotine dependence, cigarettes, uncomplicated: Secondary | ICD-10-CM | POA: Insufficient documentation

## 2020-08-01 DIAGNOSIS — Z884 Allergy status to anesthetic agent status: Secondary | ICD-10-CM | POA: Insufficient documentation

## 2020-08-01 DIAGNOSIS — I480 Paroxysmal atrial fibrillation: Secondary | ICD-10-CM | POA: Insufficient documentation

## 2020-08-01 DIAGNOSIS — Z8679 Personal history of other diseases of the circulatory system: Secondary | ICD-10-CM

## 2020-08-01 DIAGNOSIS — M1990 Unspecified osteoarthritis, unspecified site: Secondary | ICD-10-CM | POA: Insufficient documentation

## 2020-08-01 DIAGNOSIS — Z86018 Personal history of other benign neoplasm: Secondary | ICD-10-CM | POA: Insufficient documentation

## 2020-08-01 DIAGNOSIS — Z8249 Family history of ischemic heart disease and other diseases of the circulatory system: Secondary | ICD-10-CM | POA: Insufficient documentation

## 2020-08-01 DIAGNOSIS — G8929 Other chronic pain: Secondary | ICD-10-CM

## 2020-08-01 DIAGNOSIS — Z79899 Other long term (current) drug therapy: Secondary | ICD-10-CM | POA: Insufficient documentation

## 2020-08-01 DIAGNOSIS — Z1211 Encounter for screening for malignant neoplasm of colon: Secondary | ICD-10-CM

## 2020-08-01 DIAGNOSIS — E1142 Type 2 diabetes mellitus with diabetic polyneuropathy: Secondary | ICD-10-CM

## 2020-08-01 DIAGNOSIS — Z833 Family history of diabetes mellitus: Secondary | ICD-10-CM | POA: Insufficient documentation

## 2020-08-01 DIAGNOSIS — E6609 Other obesity due to excess calories: Secondary | ICD-10-CM | POA: Insufficient documentation

## 2020-08-01 DIAGNOSIS — E1165 Type 2 diabetes mellitus with hyperglycemia: Secondary | ICD-10-CM

## 2020-08-01 DIAGNOSIS — I10 Essential (primary) hypertension: Secondary | ICD-10-CM | POA: Insufficient documentation

## 2020-08-01 DIAGNOSIS — Z794 Long term (current) use of insulin: Secondary | ICD-10-CM | POA: Insufficient documentation

## 2020-08-01 DIAGNOSIS — F17200 Nicotine dependence, unspecified, uncomplicated: Secondary | ICD-10-CM

## 2020-08-01 LAB — POCT CBG (FASTING - GLUCOSE)-MANUAL ENTRY: Glucose Fasting, POC: 156 mg/dL — AB (ref 70–99)

## 2020-08-01 LAB — POCT GLYCOSYLATED HEMOGLOBIN (HGB A1C): HbA1c, POC (controlled diabetic range): 9 % — AB (ref 0.0–7.0)

## 2020-08-01 MED ORDER — TRUE METRIX BLOOD GLUCOSE TEST VI STRP: ORAL_STRIP | 12 refills | Status: DC

## 2020-08-01 MED ORDER — DILTIAZEM HCL ER COATED BEADS 120 MG PO CP24: 120.0000 mg | ORAL_CAPSULE | Freq: Every day | ORAL | 0 refills | Status: DC

## 2020-08-01 MED ORDER — INSULIN LISPRO (1 UNIT DIAL) 100 UNIT/ML (KWIKPEN): PEN_INJECTOR | SUBCUTANEOUS | 1 refills | Status: DC

## 2020-08-01 MED ORDER — CLINDAMYCIN PHOS-BENZOYL PEROX 1-5 % EX GEL: Freq: Two times a day (BID) | CUTANEOUS | 0 refills | Status: DC

## 2020-08-01 MED ORDER — TRUEPLUS LANCETS 28G MISC: 1 refills | Status: DC

## 2020-08-01 MED ORDER — TRUE METRIX METER W/DEVICE KIT: PACK | 0 refills | Status: DC

## 2020-08-01 MED ORDER — NICOTINE 21 MG/24HR TD PT24: 21.0000 mg | MEDICATED_PATCH | Freq: Every day | TRANSDERMAL | 0 refills | Status: DC

## 2020-08-01 MED ORDER — POTASSIUM CHLORIDE ER 10 MEQ PO TBCR
10.0000 meq | EXTENDED_RELEASE_TABLET | Freq: Every day | ORAL | 0 refills | Status: DC
Start: 2020-08-01 — End: 2020-11-02

## 2020-08-01 MED ORDER — ATORVASTATIN CALCIUM 40 MG PO TABS: 40.0000 mg | ORAL_TABLET | Freq: Every day | ORAL | 0 refills | Status: DC

## 2020-08-01 MED ORDER — BASAGLAR KWIKPEN 100 UNIT/ML ~~LOC~~ SOPN
40.0000 [IU] | PEN_INJECTOR | Freq: Every day | SUBCUTANEOUS | 1 refills | Status: DC
Start: 2020-08-01 — End: 2020-11-02

## 2020-08-01 MED ORDER — GABAPENTIN 300 MG PO CAPS: 300.0000 mg | ORAL_CAPSULE | Freq: Three times a day (TID) | ORAL | 3 refills | Status: DC

## 2020-08-01 MED ORDER — BD PEN NEEDLE MINI U/F 31G X 5 MM MISC: 3 refills | Status: DC

## 2020-08-01 MED ORDER — DICLOFENAC SODIUM 1 % EX GEL: 4.0000 g | Freq: Four times a day (QID) | CUTANEOUS | 3 refills | Status: DC

## 2020-08-01 MED FILL — ?HUMALOG 100 UNITS/ML KWIKP: 100 | 28 days supply | Qty: 3 | Fill #0

## 2020-08-01 MED FILL — POTASSIUM CHLORIDE ER 10 ME: 10 | 21 days supply | Qty: 21 | Fill #0

## 2020-08-01 MED FILL — CLINDAMYCIN PHOS-BENZOYL PE: 1-5 | 30 days supply | Qty: 50 | Fill #0

## 2020-08-01 MED FILL — !TRUE METRIX BLOOD GLUCOSE: 1 days supply | Qty: 1 | Fill #0

## 2020-08-01 MED FILL — TRUE METRIX TEST STRIP: 50 days supply | Qty: 100 | Fill #0

## 2020-08-01 MED FILL — TRUEplus LANCETS 28G MISC: 50 days supply | Qty: 100 | Fill #0

## 2020-08-01 MED FILL — DICLOFENAC SODIUM 1% GEL: 1 | 6 days supply | Qty: 100 | Fill #0

## 2020-08-01 MED FILL — TRUEPLUS 5-BEVEL PEN NEEDLE: 31G X 5 MM | 25 days supply | Qty: 100 | Fill #0

## 2020-08-01 MED FILL — NICOTINE 21 MG/24HR PATCH: 21 | 28 days supply | Qty: 28 | Fill #0

## 2020-08-01 MED FILL — GABAPENTIN 300 MG CAPSULE: 300 | 30 days supply | Qty: 90 | Fill #0

## 2020-08-01 MED FILL — ?BASAGLAR 100 UNITS/ML KWPE: 100 | 30 days supply | Qty: 12 | Fill #0

## 2020-08-01 MED FILL — ?ATORVASTATIN 40MG TABLET: 40 | 30 days supply | Qty: 30 | Fill #0

## 2020-08-01 NOTE — Assessment & Plan Note (Addendum)
  .   Current smoking consumption amount: 4 cigarettes daily  . Dicsussion on advise to quit smoking and smoking impacts: Cardiovascular health impacts  . Patient's willingness to quit: Willing to quit  . Methods to quit smoking discussed: Behavioral modification nicotine replacement  . Medication management of smoking session drugs discussed: Nicotine patches  . Resources provided:  AVS   . Setting quit date not established  . Follow-up arranged 1 month   Time spent counseling the patient: 5 minutes

## 2020-08-01 NOTE — Progress Notes (Signed)
Fall last week Back and bilateral hip and leg pain  Not taking insulin.  Rather take metformin

## 2020-08-01 NOTE — Assessment & Plan Note (Signed)
Topical BenzaClin given

## 2020-08-01 NOTE — Assessment & Plan Note (Signed)
Poorly controlled type 2 diabetes A1c greater than 9 off all insulin products  Will resume insulin Basaglar 40 units at bedtime and Humalog 5 units twice daily with the 2 largest meals of the day  Insulin needles and glucose testing supplies given to patient  Patient given instruction as to the self injection process of the insulin products by clinical pharmacist  Return to see clinical pharmacist in 2 weeks Dr. Joya Gaskins in 1 month

## 2020-08-01 NOTE — Assessment & Plan Note (Signed)
Obesity with BMI of 30  Appropriate diabetic diet given

## 2020-08-01 NOTE — Assessment & Plan Note (Signed)
Hypertension well controlled on diltiazem will not renew lisinopril she has been off this for some time

## 2020-08-01 NOTE — Assessment & Plan Note (Signed)
Chronic arthritis and now lumbar back pain and right hip pain  Obtain x-rays of the right hip and lower back  Apply Voltaren gel topically to the back and begin gabapentin 300 mg 3 times daily

## 2020-08-01 NOTE — Assessment & Plan Note (Signed)
Appear to be paroxysmal in the past now in sinus rhythm was on anticoagulation now off  Referral to cardiology

## 2020-08-01 NOTE — Patient Instructions (Addendum)
Resume insulin glargine 40 units at bedtime using the KwikPen  Resume insulin Humalog 5 units twice a day with 2 of your largest meals of the day  Continue the diltiazem daily, this is the only blood pressure medicine you need for now  Begin atorvastatin daily cholesterol medicine  Gabapentin was given 3 times daily for back pain  Voltaren gel to the lower back also given  BenzaClin cream to the back for the rash was given apply this twice a day  X-rays of the back will be made  Focus on stopping smoking using nicotine patch sent to the pharmacy  Follow a healthy diet as outlined below  Follow-up with Lurena Joiner our clinical pharmacist in 2 weeks and Dr. Joya Gaskins in 1 month, please bring your daughter with you at the next visit  A pneumonia shot and flu shot was given at this visit  We will give you a tetanus vaccine in the next visit  You are due a mammogram this was ordered  You are due to be screened for colon cancer a stool kit was given to you from laboratory please turn this back in after you obtain the sample  We will need to get you scheduled for a Pap smear will have one of our colleagues perform this after the new year  Labs today include metabolic profile to check your kidneys liver potassium, blood counts, urine for microalbumin will also be obtained to check for protein in the urine  A new glucose meter will be given along with testing supplies please check your blood sugar at least twice a day    Tobacco Use Disorder Tobacco use disorder (TUD) occurs when a person craves, seeks, and uses tobacco, regardless of the consequences. This disorder can cause problems with mental and physical health. It can affect your ability to have healthy relationships, and it can keep you from meeting your responsibilities at work, home, or school. Tobacco may be:  Smoked as a cigarette or cigar.  Inhaled using e-cigarettes.  Smoked in a pipe or hookah.  Chewed as smokeless  tobacco.  Inhaled into the nostrils as snuff. Tobacco products contain a dangerous chemical called nicotine, which is very addictive. Nicotine triggers hormones that make the body feel stimulated and works on areas of the brain that make you feel good. These effects can make it hard for people to quit nicotine. Tobacco contains many other unsafe chemicals that can damage almost every organ in the body. Smoking tobacco also puts others in danger due to fire risk and possible health problems caused by breathing in secondhand smoke. What are the signs or symptoms? Symptoms of TUD may include:  Being unable to slow down or stop your tobacco use.  Spending an abnormal amount of time getting or using tobacco.  Craving tobacco. Cravings may last for up to 6 months after quitting.  Tobacco use that: ? Interferes with your work, school, or home life. ? Interferes with your personal and social relationships. ? Makes you give up activities that you once enjoyed or found important.  Using tobacco even though you know that it is: ? Dangerous or bad for your health or someone else's health. ? Causing problems in your life.  Needing more and more of the substance to get the same effect (developing tolerance).  Experiencing unpleasant symptoms if you do not use the substance (withdrawal). Withdrawal symptoms may include: ? Depressed, anxious, or irritable mood. ? Difficulty concentrating. ? Increased appetite. ? Restlessness or trouble sleeping.  Using the substance to avoid withdrawal. How is this diagnosed? This condition may be diagnosed based on:  Your current and past tobacco use. Your health care provider may ask questions about how your tobacco use affects your life.  A physical exam. You may be diagnosed with TUD if you have at least two symptoms within a 68-monthperiod. How is this treated? This condition is treated by stopping tobacco use. Many people are unable to quit on their  own and need help. Treatment may include:  Nicotine replacement therapy (NRT). NRT provides nicotine without the other harmful chemicals in tobacco. NRT gradually lowers the dosage of nicotine in the body and reduces withdrawal symptoms. NRT is available as: ? Over-the-counter gums, lozenges, and skin patches. ? Prescription mouth inhalers and nasal sprays.  Medicine that acts on the brain to reduce cravings and withdrawal symptoms.  A type of talk therapy that examines your triggers for tobacco use, how to avoid them, and how to cope with cravings (behavioral therapy).  Hypnosis. This may help with withdrawal symptoms.  Joining a support group for others coping with TUD. The best treatment for TUD is usually a combination of medicine, talk therapy, and support groups. Recovery can be a long process. Many people start using tobacco again after stopping (relapse). If you relapse, it does not mean that treatment will not work. Follow these instructions at home:  Lifestyle  Do not use any products that contain nicotine or tobacco, such as cigarettes and e-cigarettes.  Avoid things that trigger tobacco use as much as you can. Triggers include people and situations that usually cause you to use tobacco.  Avoid drinks that contain caffeine, including coffee. These may worsen some withdrawal symptoms.  Find ways to manage stress. Wanting to smoke may cause stress, and stress can make you want to smoke. Relaxation techniques such as deep breathing, meditation, and yoga may help.  Attend support groups as needed. These groups are an important part of long-term recovery for many people. General instructions  Take over-the-counter and prescription medicines only as told by your health care provider.  Check with your health care provider before taking any new prescription or over-the-counter medicines.  Decide on a friend, family member, or smoking quit-line (such as 1-800-QUIT-NOW in the [place]  that you can call or text when you feel the urge to smoke or when you need help coping with cravings.  Keep all follow-up visits as told by your health care provider and therapist. This is important. Contact a health care provider if:  You are not able to take your medicines as prescribed.  Your symptoms get worse, even with treatment. Summary  Tobacco use disorder (TUD) occurs when a person craves, seeks, and uses tobacco regardless of the consequences.  This condition may be diagnosed based on your current and past tobacco use and a physical exam.  Many people are unable to quit on their own and need help. Recovery can be a long process.  The most effective treatment for TUD is usually a combination of medicine, talk therapy, and support groups. This information is not intended to replace advice given to you by your health care provider. Make sure you discuss any questions you have with your health care provider. Document Revised: 07/31/2017 Document Reviewed: 07/31/2017 Elsevier Patient Education  2[place]  Diabetes Mellitus and Nutrition, Adult When you have diabetes (diabetes mellitus), it is very important to have healthy eating habits because your blood sugar (glucose) levels are greatly  affected by what you eat and drink. Eating healthy foods in the appropriate amounts, at about the same times every day, can help you:  Control your blood glucose.  Lower your risk of heart disease.  Improve your blood pressure.  Reach or maintain a healthy weight. Every person with diabetes is different, and each person has different needs for a meal plan. Your health care provider may recommend that you work with a diet and nutrition specialist (dietitian) to make a meal plan that is best for you. Your meal plan may vary depending on factors such as:  The calories you need.  The medicines you take.  Your weight.  Your blood glucose, blood pressure, and cholesterol  levels.  Your activity level.  Other health conditions you have, such as heart or kidney disease. How do carbohydrates affect me? Carbohydrates, also called carbs, affect your blood glucose level more than any other type of food. Eating carbs naturally raises the amount of glucose in your blood. Carb counting is a method for keeping track of how many carbs you eat. Counting carbs is important to keep your blood glucose at a healthy level, especially if you use insulin or take certain oral diabetes medicines. It is important to know how many carbs you can safely have in each meal. This is different for every person. Your dietitian can help you calculate how many carbs you should have at each meal and for each snack. Foods that contain carbs include:  Bread, cereal, rice, pasta, and crackers.  Potatoes and corn.  Peas, beans, and lentils.  Milk and yogurt.  Fruit and juice.  Desserts, such as cakes, cookies, ice cream, and candy. How does alcohol affect me? Alcohol can cause a sudden decrease in blood glucose (hypoglycemia), especially if you use insulin or take certain oral diabetes medicines. Hypoglycemia can be a life-threatening condition. Symptoms of hypoglycemia (sleepiness, dizziness, and confusion) are similar to symptoms of having too much alcohol. If your health care provider says that alcohol is safe for you, follow these guidelines:  Limit alcohol intake to no more than 1 drink per day for nonpregnant women and 2 drinks per day for men. One drink equals 12 oz of beer, 5 oz of wine, or 1 oz of hard liquor.  Do not drink on an empty stomach.  Keep yourself hydrated with water, diet soda, or unsweetened iced tea.  Keep in mind that regular soda, juice, and other mixers may contain a lot of sugar and must be counted as carbs. What are tips for following this plan?  Reading food labels  Start by checking the serving size on the "Nutrition Facts" label of packaged foods and  drinks. The amount of calories, carbs, fats, and other nutrients listed on the label is based on one serving of the item. Many items contain more than one serving per package.  Check the total grams (g) of carbs in one serving. You can calculate the number of servings of carbs in one serving by dividing the total carbs by 15. For example, if a food has 30 g of total carbs, it would be equal to 2 servings of carbs.  Check the number of grams (g) of saturated and trans fats in one serving. Choose foods that have low or no amount of these fats.  Check the number of milligrams (mg) of salt (sodium) in one serving. Most people should limit total sodium intake to less than 2,300 mg per day.  Always check the nutrition  information of foods labeled as "low-fat" or "nonfat". These foods may be higher in added sugar or refined carbs and should be avoided.  Talk to your dietitian to identify your daily goals for nutrients listed on the label. Shopping  Avoid buying canned, premade, or processed foods. These foods tend to be high in fat, sodium, and added sugar.  Shop around the outside edge of the grocery store. This includes fresh fruits and vegetables, bulk grains, fresh meats, and fresh dairy. Cooking  Use low-heat cooking methods, such as baking, instead of high-heat cooking methods like deep frying.  Cook using healthy oils, such as olive, canola, or sunflower oil.  Avoid cooking with butter, cream, or high-fat meats. Meal planning  Eat meals and snacks regularly, preferably at the same times every day. Avoid going long periods of time without eating.  Eat foods high in fiber, such as fresh fruits, vegetables, beans, and whole grains. Talk to your dietitian about how many servings of carbs you can eat at each meal.  Eat 4-6 ounces (oz) of lean protein each day, such as lean meat, chicken, fish, eggs, or tofu. One oz of lean protein is equal to: ? 1 oz of meat, chicken, or fish. ? 1 egg. ?   cup of tofu.  Eat some foods each day that contain healthy fats, such as avocado, nuts, seeds, and fish. Lifestyle  Check your blood glucose regularly.  Exercise regularly as told by your health care provider. This may include: ? 150 minutes of moderate-intensity or vigorous-intensity exercise each week. This could be brisk walking, biking, or water aerobics. ? Stretching and doing strength exercises, such as yoga or weightlifting, at least 2 times a week.  Take medicines as told by your health care provider.  Do not use any products that contain nicotine or tobacco, such as cigarettes and e-cigarettes. If you need help quitting, ask your health care provider.  Work with a Social worker or diabetes educator to identify strategies to manage stress and any emotional and social challenges. Questions to ask a health care provider  Do I need to meet with a diabetes educator?  Do I need to meet with a dietitian?  What number can I call if I have questions?  When are the best times to check my blood glucose? Where to find more information:  American Diabetes Association: diabetes.org  Academy of Nutrition and Dietetics: www.eatright.[employer] of Diabetes and Digestive and Kidney Diseases (NIH): [employer] Summary  A healthy meal plan will help you control your blood glucose and maintain a healthy lifestyle.  Working with a diet and nutrition specialist (dietitian) can help you make a meal plan that is best for you.  Keep in mind that carbohydrates (carbs) and alcohol have immediate effects on your blood glucose levels. It is important to count carbs and to use alcohol carefully. This information is not intended to replace advice given to you by your health care provider. Make sure you discuss any questions you have with your health care provider. Document Revised: 07/26/2017 Document Reviewed: 09/17/2016 Elsevier Patient Education  2020 [employer].

## 2020-08-02 LAB — CBC WITH DIFFERENTIAL/PLATELET
Basophils Absolute: 0.1 10*3/uL (ref 0.0–0.2)
Basos: 1 %
EOS (ABSOLUTE): 0.2 10*3/uL (ref 0.0–0.4)
Eos: 2 %
Hematocrit: 41.3 % (ref 34.0–46.6)
Hemoglobin: 14.1 g/dL (ref 11.1–15.9)
Immature Grans (Abs): 0 10*3/uL (ref 0.0–0.1)
Immature Granulocytes: 0 %
Lymphocytes Absolute: 3.2 10*3/uL — ABNORMAL HIGH (ref 0.7–3.1)
Lymphs: 29 %
MCH: 29.7 pg (ref 26.6–33.0)
MCHC: 34.1 g/dL (ref 31.5–35.7)
MCV: 87 fL (ref 79–97)
Monocytes Absolute: 0.5 10*3/uL (ref 0.1–0.9)
Monocytes: 5 %
Neutrophils Absolute: 6.9 10*3/uL (ref 1.4–7.0)
Neutrophils: 63 %
Platelets: 292 10*3/uL (ref 150–450)
RBC: 4.75 x10E6/uL (ref 3.77–5.28)
RDW: 13.3 % (ref 11.7–15.4)
WBC: 11 10*3/uL — ABNORMAL HIGH (ref 3.4–10.8)

## 2020-08-02 LAB — COMPREHENSIVE METABOLIC PANEL
ALT: 24 IU/L (ref 0–32)
AST: 20 IU/L (ref 0–40)
Albumin/Globulin Ratio: 1.6 (ref 1.2–2.2)
Albumin: 4.4 g/dL (ref 3.8–4.9)
Alkaline Phosphatase: 42 IU/L — ABNORMAL LOW (ref 44–121)
BUN/Creatinine Ratio: 13 (ref 9–23)
BUN: 7 mg/dL (ref 6–24)
Bilirubin Total: 0.3 mg/dL (ref 0.0–1.2)
CO2: 26 mmol/L (ref 20–29)
Calcium: 9.7 mg/dL (ref 8.7–10.2)
Chloride: 103 mmol/L (ref 96–106)
Creatinine, Ser: 0.54 mg/dL — ABNORMAL LOW (ref 0.57–1.00)
GFR calc Af Amer: 121 mL/min/{1.73_m2} (ref >59)
GFR calc non Af Amer: 105 mL/min/{1.73_m2} (ref >59)
Globulin, Total: 2.8 g/dL (ref 1.5–4.5)
Glucose: 139 mg/dL — ABNORMAL HIGH (ref 65–99)
Potassium: 4 mmol/L (ref 3.5–5.2)
Sodium: 143 mmol/L (ref 134–144)
Total Protein: 7.2 g/dL (ref 6.0–8.5)

## 2020-08-02 LAB — MICROALBUMIN / CREATININE URINE RATIO
Creatinine, Urine: 80.2 mg/dL
Microalb/Creat Ratio: 42 mg/g creat — ABNORMAL HIGH (ref 0–29)
Microalbumin, Urine: 33.5 ug/mL

## 2020-08-08 ENCOUNTER — Telehealth: Payer: Self-pay | Admitting: Critical Care Medicine

## 2020-08-08 NOTE — Telephone Encounter (Signed)
I call the Pt, since I received an email from [place] that tell us when her [place] was [place]. Does this patient have out of state medicaid?  Date of service 10 14 2021 and 08 02 2021 shows medicaid added. yes, Pt was told out that, she said that she cancels her [facility] before she came to apply, according to the Pt the Medicaid was cancel for November already, e need to see something that indicates she no longer has active medicaid coverage, also she has 15 days to submit this document

## 2020-08-10 LAB — FECAL OCCULT BLOOD, IMMUNOCHEMICAL: Fecal Occult Bld: NEGATIVE

## 2020-08-16 ENCOUNTER — Ambulatory Visit: Payer: Medicaid Other | Admitting: Pharmacist

## 2020-08-16 ENCOUNTER — Encounter: Payer: Self-pay | Admitting: *Deleted

## 2020-08-29 ENCOUNTER — Emergency Department (HOSPITAL_COMMUNITY)
Admission: EM | Admit: 2020-08-29 | Discharge: 2020-08-29 | Disposition: A | Discharge status: Home / Self Care | Payer: Medicaid Other | Attending: Emergency Medicine | Admitting: Emergency Medicine

## 2020-08-29 ENCOUNTER — Encounter (HOSPITAL_COMMUNITY): Payer: Self-pay | Admitting: Emergency Medicine

## 2020-08-29 DIAGNOSIS — Z5321 Procedure and treatment not carried out due to patient leaving prior to being seen by health care provider: Secondary | ICD-10-CM | POA: Insufficient documentation

## 2020-08-29 DIAGNOSIS — E119 Type 2 diabetes mellitus without complications: Secondary | ICD-10-CM | POA: Insufficient documentation

## 2020-08-29 DIAGNOSIS — R519 Headache, unspecified: Secondary | ICD-10-CM | POA: Insufficient documentation

## 2020-08-29 DIAGNOSIS — R42 Dizziness and giddiness: Secondary | ICD-10-CM | POA: Insufficient documentation

## 2020-08-29 DIAGNOSIS — H538 Other visual disturbances: Secondary | ICD-10-CM | POA: Insufficient documentation

## 2020-08-29 LAB — BASIC METABOLIC PANEL
Anion gap: 10 (ref 5–15)
BUN: 7 mg/dL (ref 6–20)
CO2: 27 mmol/L (ref 22–32)
Calcium: 9.6 mg/dL (ref 8.9–10.3)
Chloride: 103 mmol/L (ref 98–111)
Creatinine, Ser: 0.56 mg/dL (ref 0.44–1.00)
GFR, Estimated: >60 mL/min (ref >60)
Glucose, Bld: 157 mg/dL — ABNORMAL HIGH (ref 70–99)
Potassium: 3.6 mmol/L (ref 3.5–5.1)
Sodium: 140 mmol/L (ref 135–145)

## 2020-08-29 LAB — CBC
HCT: 38.9 % (ref 36.0–46.0)
Hemoglobin: 13.4 g/dL (ref 12.0–15.0)
MCH: 29.5 pg (ref 26.0–34.0)
MCHC: 34.4 g/dL (ref 30.0–36.0)
MCV: 85.7 fL (ref 80.0–100.0)
Platelets: 255 10*3/uL (ref 150–400)
RBC: 4.54 MIL/uL (ref 3.87–5.11)
RDW: 12.3 % (ref 11.5–15.5)
WBC: 9.1 10*3/uL (ref 4.0–10.5)
nRBC: 0 % (ref 0.0–0.2)

## 2020-08-29 LAB — I-STAT BETA HCG BLOOD, ED (MC, WL, AP ONLY): I-stat hCG, quantitative: <5 m[IU]/mL

## 2020-08-29 NOTE — ED Notes (Addendum)
Pt called 3x no answer  

## 2020-08-29 NOTE — ED Triage Notes (Addendum)
Pt reports dizziness, sudden onset yesterday at 1500 upon waking up from a nap, states she also had a pounding headache. Denies any other symptoms. Speech clear, a/ox4, no neuro deficits. Hx of afib and diabetes. Pt also endorses bilateral blurred vision that started upon waking up yesterday afternoon as well. Denies any nausea or vomiting.

## 2020-08-29 NOTE — ED Notes (Signed)
Pt called 1x no answer 

## 2020-08-30 ENCOUNTER — Encounter (HOSPITAL_COMMUNITY): Payer: Self-pay

## 2020-08-30 ENCOUNTER — Ambulatory Visit (HOSPITAL_COMMUNITY)
Admission: EM | Admit: 2020-08-30 | Discharge: 2020-08-30 | Disposition: A | Discharge status: Home / Self Care | Payer: Self-pay | Attending: Student | Admitting: Student

## 2020-08-30 ENCOUNTER — Other Ambulatory Visit: Payer: Self-pay

## 2020-08-30 DIAGNOSIS — R42 Dizziness and giddiness: Secondary | ICD-10-CM

## 2020-08-30 DIAGNOSIS — F17210 Nicotine dependence, cigarettes, uncomplicated: Secondary | ICD-10-CM | POA: Insufficient documentation

## 2020-08-30 DIAGNOSIS — Z79899 Other long term (current) drug therapy: Secondary | ICD-10-CM | POA: Insufficient documentation

## 2020-08-30 DIAGNOSIS — Z20822 Contact with and (suspected) exposure to covid-19: Secondary | ICD-10-CM | POA: Insufficient documentation

## 2020-08-30 DIAGNOSIS — I10 Essential (primary) hypertension: Secondary | ICD-10-CM | POA: Insufficient documentation

## 2020-08-30 DIAGNOSIS — I480 Paroxysmal atrial fibrillation: Secondary | ICD-10-CM | POA: Insufficient documentation

## 2020-08-30 LAB — COMPREHENSIVE METABOLIC PANEL
ALT: 15 U/L (ref 0–44)
AST: 16 U/L (ref 15–41)
Albumin: 3.6 g/dL (ref 3.5–5.0)
Alkaline Phosphatase: 38 U/L (ref 38–126)
Anion gap: 10 (ref 5–15)
BUN: 9 mg/dL (ref 6–20)
CO2: 24 mmol/L (ref 22–32)
Calcium: 9.6 mg/dL (ref 8.9–10.3)
Chloride: 105 mmol/L (ref 98–111)
Creatinine, Ser: 0.74 mg/dL (ref 0.44–1.00)
GFR, Estimated: >60 mL/min (ref >60)
Glucose, Bld: 149 mg/dL — ABNORMAL HIGH (ref 70–99)
Potassium: 3.7 mmol/L (ref 3.5–5.1)
Sodium: 139 mmol/L (ref 135–145)
Total Bilirubin: 0.4 mg/dL (ref 0.3–1.2)
Total Protein: 7.1 g/dL (ref 6.5–8.1)

## 2020-08-30 LAB — CBC
HCT: 39.2 % (ref 36.0–46.0)
Hemoglobin: 13.6 g/dL (ref 12.0–15.0)
MCH: 29.6 pg (ref 26.0–34.0)
MCHC: 34.7 g/dL (ref 30.0–36.0)
MCV: 85.4 fL (ref 80.0–100.0)
Platelets: 268 10*3/uL (ref 150–400)
RBC: 4.59 MIL/uL (ref 3.87–5.11)
RDW: 12.4 % (ref 11.5–15.5)
WBC: 11.3 10*3/uL — ABNORMAL HIGH (ref 4.0–10.5)
nRBC: 0 % (ref 0.0–0.2)

## 2020-08-30 LAB — RESP PANEL BY RT-PCR (FLU A&B, COVID) ARPGX2
Influenza A by PCR: NEGATIVE
Influenza B by PCR: NEGATIVE
SARS Coronavirus 2 by RT PCR: NEGATIVE

## 2020-08-30 LAB — CBG MONITORING, ED: Glucose-Capillary: 147 mg/dL — ABNORMAL HIGH (ref 70–99)

## 2020-08-30 MED ORDER — MECLIZINE HCL 25 MG PO TABS: 25.0000 mg | ORAL_TABLET | Freq: Three times a day (TID) | ORAL | 0 refills | Status: DC | PRN

## 2020-08-30 MED FILL — ?MECLIZINE 25MG TAB: 25 | 10 days supply | Qty: 30 | Fill #0

## 2020-08-30 NOTE — Discharge Instructions (Addendum)
-  For dizziness, start Meclizine as needed. You can take this up to 3x daily. -Your EKG and blood sugar are normal today. We'll call you if the result of your bloodwork or covid/influenza test is abnormal.

## 2020-08-30 NOTE — ED Notes (Signed)
cbg [id number]

## 2020-08-30 NOTE — ED Provider Notes (Signed)
[place]    CSN: [id number] Arrival date & time: 08/30/20  1005      History   Chief Complaint Chief Complaint  Patient presents with  . Dizziness    HPI Anna Golden is a 60 y.o. female presenting for dizziness since Sunday (2 days). History of afib, DKA, diabetes, fatty liver disease, hypertension, menorrhagia, asthma. Presenting today with dizziness x2 days. States she tried to go to ED for this yesterday but left after 5 hours due to wait. Dizziness is constant like the room is spinning, but worse with standing or moving. Endorses tinnitus R ear which is new. Sugars have been running high 100s at home. History of afib per pt that is well controlled. Some blurry vision yesterday but not today. Denies chest pain, swelling in LEs. Notes tender anterior lymphadenopathy and that she is exposed to covid at work. 7/10 headache. Denies worst headache of life, thunderclap headache, weakness/sensation changes in arms/legs, shortness of breath, chest pain/pressure, photophobia, phonophobia, n/v/d. Denies fevers/chills, n/v/d, shortness of breath, chest pain, cough, congestion, facial pain, teeth pain, headaches, sore throat, loss of taste/smell, swollen lymph nodes, ear pain. Denies hematuria, dysuria, frequency, urgency, back pain, n/v/d/abd pain, fevers/chills, abdnormal vaginal discharge.     HPI  Past Medical History:  Diagnosis Date  . Acute asthma exacerbation 03/07/2014  . Arthritis   . Asthma   . DKA (diabetic ketoacidoses) 03/07/2014  . DM (diabetes mellitus) ([place]) 04/24/2013  . Dysmenorrhea 01/23/2013  . Fatty liver disease, nonalcoholic   . Hypertension   . Leiomyoma of uterus, unspecified 01/23/2013  . Leukocytosis   . Menorrhagia with regular cycle 01/23/2013  . Obesity   . PAF (paroxysmal atrial fibrillation) ([place])    a. Dx 03/2013, spont converted to NSR.  Marland Kitchen Sickle cell trait ([place])   . Smoking 03/18/2014    Patient Active Problem List   Diagnosis Date Noted   . Chronic right-sided low back pain without sciatica 08/01/2020  . Mixed hyperlipidemia 08/01/2020  . History of atrial fibrillation 08/01/2020  . Folliculitis 36/14/4315  . Smoking 03/18/2014  . Arthritis 03/18/2014  . Poorly controlled type 2 diabetes mellitus with peripheral neuropathy ([place]) 04/24/2013  . Hypertension 04/24/2013  . Obesity 04/24/2013  . Leiomyoma of uterus, unspecified 01/23/2013  . Dysmenorrhea 01/23/2013    Past Surgical History:  Procedure Laterality Date  . HERNIA REPAIR      OB History    Gravida  6   Para  4   Term  1   Preterm  3   AB  2   Living  3     SAB  2   IAB      Ectopic      Multiple      Live Births  3            Home Medications    Prior to Admission medications   Medication Sig Start Date End Date Taking? Authorizing Provider  meclizine (ANTIVERT) 25 MG tablet Take 1 tablet (25 mg total) by mouth 3 (three) times daily as needed for dizziness. 08/30/20  Yes Hazel Sams, PA-C  acetaminophen (TYLENOL) 650 MG CR tablet Take 650 mg by mouth every 8 (eight) hours as needed for pain.    [provider]  atorvastatin (LIPITOR) 40 MG tablet Take 1 tablet (40 mg total) by mouth daily. 08/01/20 08/31/20  Elsie Stain, MD  Blood Glucose Monitoring Suppl (TRUE METRIX METER) w/Device KIT Use to measure blood sugar  twice a day 08/01/20   Elsie Stain, MD  Calcium Carbonate-Vitamin D (CALCIUM 500 + D PO) Take 1 tablet by mouth daily.     [provider]  clindamycin-benzoyl peroxide (BENZACLIN) gel Apply topically 2 (two) times daily. To affected areas of the back 08/01/20   Elsie Stain, MD  diclofenac Sodium (VOLTAREN) 1 % GEL Apply 4 g topically 4 (four) times daily. 08/01/20   Elsie Stain, MD  diltiazem (CARDIZEM CD) 120 MG 24 hr capsule Take 1 capsule (120 mg total) by mouth daily. 08/01/20 08/31/20  Elsie Stain, MD  gabapentin (NEURONTIN) 300 MG capsule Take 1 capsule (300 mg total) by  mouth 3 (three) times daily. 08/01/20   Elsie Stain, MD  glucose blood (TRUE METRIX BLOOD GLUCOSE TEST) test strip Use as instructed 08/01/20   Elsie Stain, MD  Insulin Glargine [facility]) 100 UNIT/ML Inject 40 Units into the skin daily. 08/01/20 08/31/20  Elsie Stain, MD  insulin lispro (HUMALOG) 100 UNIT/ML KwikPen 5 units twice a day with two largest meals of the day 08/01/20   Elsie Stain, MD  Insulin Pen Needle (B-D UF III MINI PEN NEEDLES) 31G X 5 MM MISC Use as instructed. Inject into the skin four times per day. 08/01/20   Elsie Stain, MD  Liniments (PAIN RELIEF EX) Apply 1 application topically 4 (four) times daily as needed (pain).    [provider]  nicotine (NICODERM CQ - DOSED IN MG/24 HOURS) 21 mg/24hr patch Place 1 patch (21 mg total) onto the skin daily. 08/01/20   Elsie Stain, MD  potassium chloride (KLOR-CON) 10 MEQ tablet Take 1 tablet (10 mEq total) by mouth daily for 21 days. 08/01/20 08/22/20  Elsie Stain, MD  TRUEplus Lancets 28G MISC Use to measure blood sugar twice a day 08/01/20   Elsie Stain, MD  insulin aspart protamine- aspart (NOVOLOG MIX 70/30) (70-30) 100 UNIT/ML injection Inject 0.08 mLs (8 Units total) into the skin 2 (two) times daily with a meal. Patient not taking: Reported on 01/15/2015 04/26/14 01/26/15  Lorayne Marek, MD    Family History Family History  Problem Relation Age of Onset  . Heart disease Mother   . Diabetes Mother   . Sickle cell anemia Mother   . Cancer Father        colon    Social History Social History   Tobacco Use  . Smoking status: Current Every Day Smoker    Packs/day: 0.25    Types: Cigarettes  . Smokeless tobacco: Never Used  Vaping Use  . Vaping Use: Never used  Substance Use Topics  . Alcohol use: No  . Drug use: No     Allergies   Metoprolol, Other, Latex, and Pork-derived products   Review of Systems Review of Systems  Constitutional: Negative for  appetite change, chills and fever.  HENT: Positive for tinnitus. Negative for congestion, ear pain, rhinorrhea, sinus pressure, sinus pain and sore throat.   Eyes: Negative for redness and visual disturbance.  Respiratory: Negative for cough, chest tightness, shortness of breath and wheezing.   Cardiovascular: Negative for chest pain and palpitations.  Gastrointestinal: Negative for abdominal pain, constipation, diarrhea, nausea and vomiting.  Genitourinary: Negative for dysuria, frequency and urgency.  Musculoskeletal: Negative for myalgias.  Neurological: Positive for dizziness and headaches. Negative for weakness.  Psychiatric/Behavioral: Negative for confusion.  All other systems reviewed and are negative.    Physical Exam Triage Vital  Signs ED Triage Vitals  Enc Vitals Group     BP 08/30/20 1216 121/70     Pulse Rate 08/30/20 1216 78     Resp 08/30/20 1216 17     Temp 08/30/20 1216 98.9 F (37.2 C)     Temp Source 08/30/20 1216 Oral     SpO2 08/30/20 1216 99 %     Weight --      Height --      Head Circumference --      Peak Flow --      Pain Score 08/30/20 1215 1     Pain Loc --      Pain Edu? --      Excl. in [place]? --    No data found.  Updated Vital Signs BP 121/70 (BP Location: Right Arm)   Pulse 78   Temp 98.9 F (37.2 C) (Oral)   Resp 17   LMP 12/20/2014   SpO2 99%   Visual Acuity Right Eye Distance:   Left Eye Distance:   Bilateral Distance:    Right Eye Near:   Left Eye Near:    Bilateral Near:     Physical Exam Vitals reviewed.  Constitutional:      General: She is not in acute distress.    Appearance: Normal appearance. She is not ill-appearing.  HENT:     Head: Normocephalic and atraumatic.     Right Ear: Hearing, tympanic membrane, ear canal and external ear normal. No swelling or tenderness. There is no impacted cerumen. No mastoid tenderness. Tympanic membrane is not perforated, erythematous, retracted or bulging.     Left Ear: Hearing,  tympanic membrane, ear canal and external ear normal. No swelling or tenderness. There is no impacted cerumen. No mastoid tenderness. Tympanic membrane is not perforated, erythematous, retracted or bulging.     Nose:     Right Sinus: No maxillary sinus tenderness or frontal sinus tenderness.     Left Sinus: No maxillary sinus tenderness or frontal sinus tenderness.     Mouth/Throat:     Mouth: Mucous membranes are moist.     Pharynx: Uvula midline. No oropharyngeal exudate or posterior oropharyngeal erythema.     Tonsils: No tonsillar exudate.  Cardiovascular:     Rate and Rhythm: Normal rate and regular rhythm.     Heart sounds: Normal heart sounds.  Pulmonary:     Effort: Pulmonary effort is normal.     Breath sounds: Normal breath sounds and air entry. No wheezing, rhonchi or rales.  Chest:     Chest wall: No tenderness.  Abdominal:     General: Abdomen is flat. Bowel sounds are normal.     Tenderness: There is no abdominal tenderness. There is no guarding or rebound.  Musculoskeletal:     Cervical back: Normal range of motion and neck supple. No rigidity.  Lymphadenopathy:     Cervical: No cervical adenopathy.  Neurological:     General: No focal deficit present.     Mental Status: She is alert and oriented to person, place, and time. Mental status is at baseline.     Cranial Nerves: Cranial nerves are intact. No cranial nerve deficit or facial asymmetry.     Sensory: Sensation is intact. No sensory deficit.     Motor: Motor function is intact. No weakness.     Coordination: Coordination is intact. Coordination normal.     Gait: Gait is intact. Gait normal.     Comments: CN 2-12 intact. No weakness  or numbness in UEs or LEs. Negative Marye Round   Psychiatric:        Attention and Perception: Attention and perception normal.        Mood and Affect: Mood and affect normal.        Behavior: Behavior normal. Behavior is cooperative.        Thought Content: Thought content  normal.        Judgment: Judgment normal.      UC Treatments / Results  Labs (all labs ordered are listed, but only abnormal results are displayed) Labs Reviewed  CBG MONITORING, ED - Abnormal; Notable for the following components:      Result Value   Glucose-Capillary 147 (*)    All other components within normal limits  RESP PANEL BY RT-PCR (FLU A&B, COVID) ARPGX2  COMPREHENSIVE METABOLIC PANEL  CBC    EKG   Radiology No results found.  Procedures Procedures (including critical care time)  Medications Ordered in UC Medications - No data to display  Initial Impression / Assessment and Plan / UC Course  I have reviewed the triage vital signs and the nursing notes.  Pertinent labs & imaging results that were available during my care of the patient were reviewed by me and considered in my medical decision making (see chart for details).     CBG 147 today. EKG today NSR, unchanged compared with 08/29/2020 EKG taken 1 day ago in ER.  ER/triage note from 1 day prior reviewed. CBC, CMP drawn today- wnl. bhCG negative   Given pt's exposure to covid at work, covid-19 and influenza tests drawn today.  Covid and influenza tests sent today- negative. Patient is fully vaccinated for covid-19. Symptomatic relief with OTC Mucinex, Nyquil, etc. Return precautions- new/worsening fevers/chills, shortness of breath, chest pain, abd pain, etc.   Meclizine as needed for dizziness. F/u with PCP or ENT if no improvement.   Final Clinical Impressions(s) / UC Diagnoses   Final diagnoses:  Dizziness     Discharge Instructions     -For dizziness, start Meclizine as needed. You can take this up to 3x daily. -Your EKG and blood sugar are normal today. We'll call you if the result of your bloodwork or covid/influenza test is abnormal.     ED Prescriptions    Medication Sig Dispense Auth. Provider   meclizine (ANTIVERT) 25 MG tablet Take 1 tablet (25 mg total) by mouth 3 (three)  times daily as needed for dizziness. 30 tablet Hazel Sams, PA-C     PDMP not reviewed this encounter.   Hazel Sams, PA-C 09/01/20 [phone]

## 2020-08-30 NOTE — ED Triage Notes (Signed)
Pt presents with dizziness since Sunday with no other complaints.

## 2020-09-02 MED FILL — DILTIAZEM 24HR ER 120 MG CA: 120 | 30 days supply | Qty: 30 | Fill #0

## 2020-09-13 DIAGNOSIS — Z20822 Contact with and (suspected) exposure to covid-19: Secondary | ICD-10-CM | POA: Diagnosis not present

## 2020-09-15 ENCOUNTER — Ambulatory Visit: Payer: Medicaid Other | Admitting: Critical Care Medicine

## 2020-10-10 ENCOUNTER — Other Ambulatory Visit: Payer: Self-pay | Admitting: Critical Care Medicine

## 2020-10-10 DIAGNOSIS — E782 Mixed hyperlipidemia: Secondary | ICD-10-CM

## 2020-10-10 MED FILL — ?BASAGLAR 100 UNITS/ML KWPE: 100 | 30 days supply | Qty: 12 | Fill #1

## 2020-10-10 MED FILL — DILTIAZEM 24HR ER 120 MG CA: 120 | 30 days supply | Qty: 30 | Fill #0

## 2020-10-10 MED FILL — ?ATORVASTATIN 40MG TABLET: 40 | 30 days supply | Qty: 30 | Fill #0

## 2020-11-02 ENCOUNTER — Other Ambulatory Visit: Payer: Self-pay

## 2020-11-02 ENCOUNTER — Encounter: Payer: Self-pay | Admitting: Critical Care Medicine

## 2020-11-02 ENCOUNTER — Ambulatory Visit: Payer: Self-pay | Attending: Critical Care Medicine | Admitting: Critical Care Medicine

## 2020-11-02 ENCOUNTER — Other Ambulatory Visit: Payer: Self-pay | Admitting: Critical Care Medicine

## 2020-11-02 ENCOUNTER — Ambulatory Visit: Payer: Medicaid Other | Admitting: Critical Care Medicine

## 2020-11-02 DIAGNOSIS — M1990 Unspecified osteoarthritis, unspecified site: Secondary | ICD-10-CM

## 2020-11-02 DIAGNOSIS — M25561 Pain in right knee: Secondary | ICD-10-CM

## 2020-11-02 DIAGNOSIS — E782 Mixed hyperlipidemia: Secondary | ICD-10-CM

## 2020-11-02 DIAGNOSIS — M545 Low back pain, unspecified: Secondary | ICD-10-CM

## 2020-11-02 DIAGNOSIS — I480 Paroxysmal atrial fibrillation: Secondary | ICD-10-CM

## 2020-11-02 DIAGNOSIS — M25562 Pain in left knee: Secondary | ICD-10-CM

## 2020-11-02 DIAGNOSIS — M25552 Pain in left hip: Secondary | ICD-10-CM

## 2020-11-02 DIAGNOSIS — G8929 Other chronic pain: Secondary | ICD-10-CM

## 2020-11-02 DIAGNOSIS — E1165 Type 2 diabetes mellitus with hyperglycemia: Secondary | ICD-10-CM

## 2020-11-02 DIAGNOSIS — E1142 Type 2 diabetes mellitus with diabetic polyneuropathy: Secondary | ICD-10-CM

## 2020-11-02 DIAGNOSIS — Z8679 Personal history of other diseases of the circulatory system: Secondary | ICD-10-CM

## 2020-11-02 DIAGNOSIS — I10 Essential (primary) hypertension: Secondary | ICD-10-CM

## 2020-11-02 DIAGNOSIS — M25551 Pain in right hip: Secondary | ICD-10-CM

## 2020-11-02 MED ORDER — METFORMIN HCL 1000 MG PO TABS: 1000.0000 mg | ORAL_TABLET | Freq: Two times a day (BID) | ORAL | 3 refills | Status: DC

## 2020-11-02 MED ORDER — DAPAGLIFLOZIN PROPANEDIOL 10 MG PO TABS
10.0000 mg | ORAL_TABLET | Freq: Every day | ORAL | 1 refills | Status: DC
  Filled 2021-01-18: qty 30, 30d supply, fill #0

## 2020-11-02 MED ORDER — ATORVASTATIN CALCIUM 40 MG PO TABS: 40.0000 mg | ORAL_TABLET | Freq: Every day | ORAL | 1 refills | Status: DC

## 2020-11-02 MED ORDER — ASPIRIN EC 81 MG PO TBEC: 81.0000 mg | DELAYED_RELEASE_TABLET | Freq: Every day | ORAL | 1 refills | Status: AC

## 2020-11-02 MED ORDER — DILTIAZEM HCL ER COATED BEADS 120 MG PO CP24
120.0000 mg | ORAL_CAPSULE | Freq: Every day | ORAL | 1 refills | Status: DC
Start: 2020-11-02 — End: 2020-11-02

## 2020-11-02 MED FILL — DILTIAZEM 24HR ER 120 MG CA: 120 | 30 days supply | Qty: 30 | Fill #0

## 2020-11-02 MED FILL — !FARXIGA 10MG TABLET: 10 | 30 days supply | Qty: 30 | Fill #0

## 2020-11-02 MED FILL — ?ATORVASTATIN 40MG TABLET: 40 | 30 days supply | Qty: 30 | Fill #0

## 2020-11-02 MED FILL — METFORMIN HCL 1000 MG TABS: 1000 | 30 days supply | Qty: 60 | Fill #0

## 2020-11-02 NOTE — Assessment & Plan Note (Signed)
Poorly controlled type 2 diabetes  Switch to oral Farxiga 10 mg daily and Metformin 1000 mg twice daily  Discontinue insulin for now  Bring the patient back in the office short-term to reassess

## 2020-11-02 NOTE — Assessment & Plan Note (Signed)
Refer back to cardiology for assessment

## 2020-11-02 NOTE — Assessment & Plan Note (Signed)
Continue diltiazem for now

## 2020-11-02 NOTE — Progress Notes (Signed)
Subjective:    Patient ID: Anna Golden, female    DOB: 02-23-1961, 60 y.o.   MRN: [id number] Virtual Visit via Telephone Note  I connected with Talbert Forest on 11/02/20 at 10:00 AM EST by telephone and verified that I am speaking with the correct person using two identifiers.   Consent:  I discussed the limitations, risks, security and privacy concerns of performing an evaluation and management service by telephone and the availability of in person appointments. I also discussed with the patient that there may be a patient responsible charge related to this service. The patient expressed understanding and agreed to proceed.  Location of patient: Patient's at home0  Location of provider:. I am in the office  Persons participating in the televisit with the patient.    No one else on the call   History of Present Illness: This is a 60 year old female who comes in to reestablish to the health and wellness clinic she is not been seen since February of this year.  Prior history of diabetes type 2 hypertension asthma history in the past of paroxysmal atrial fibrillation which is now on chronic normal sinus rhythm.  Previously on anticoagulation but has been off since August 2020 and has yet to see cardiology in follow-up and referral then was made.  Active smoking use is noted.  Recently fell injuring the right hip and lower back.  She has been off insulin products since February of this year.  On arrival A1c is greater than 9 blood glucose greater than 200.  She does note polydipsia and polyphagia.  Notes abdominal distention as well.  She had been on the Pradaxa previously and also Xarelto previously.  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  Note patient has refused to receive flu vaccine in the past and refuses today  The patient has received her Covid vaccine and is fully vaccinated  Patient complains of a rash on the back  11/02/2020 This patient is seen by way of a telephone visit she does not have technology to do video.  The patient is seen in follow-up post been seen face-to-face but decided to have it phone visit.  She has poorly controlled type 2 diabetes hemoglobin A1c greater than 9.  We attempted to get her on insulin products at the last visit unfortunately she stopped taking the insulin because she does not like to self administer with needle and insulin medications.  She was given extensive instruction by our clinical pharmacist during the last visit but she determined not to follow through on this.  She also complains of severe arthritis and low lumbar pain and hip pain she never followed through on the x-rays of the hip.  She states the Voltaren and gabapentin have not helped the pain.  Patient was also to see cardiology in follow-up of her intermittent atrial fibrillation however she never had the return follow-up established.  Patient has been compliant with diltiazem and states her blood pressures been stable on this.   Past Medical History:  Diagnosis Date  . Acute asthma exacerbation 03/07/2014  . Arthritis   . Asthma   . DKA (diabetic ketoacidoses) 03/07/2014  .  DM (diabetes mellitus) ([place]) 04/24/2013  . Dysmenorrhea 01/23/2013  . Fatty liver disease, nonalcoholic   . Hypertension   . Leiomyoma of uterus, unspecified 01/23/2013  . Leukocytosis   . Menorrhagia with regular cycle 01/23/2013  . Obesity   . PAF (paroxysmal atrial fibrillation) ([place])    a. Dx 03/2013, spont converted to NSR.  Marland Kitchen Sickle cell trait ([place])   . Smoking 03/18/2014     Family History  Problem Relation Age of Onset  . Heart disease Mother   . Diabetes Mother   . Sickle  cell anemia Mother   . Cancer Father        colon     Social History   Socioeconomic History  . Marital status: Legally Separated    Spouse name: Not on file  . Number of children: Not on file  . Years of education: Not on file  . Highest education level: Not on file  Occupational History  . Not on file  Tobacco Use  . Smoking status: Current Every Day Smoker    Packs/day: 0.25    Types: Cigarettes  . Smokeless tobacco: Never Used  Vaping Use  . Vaping Use: Never used  Substance and Sexual Activity  . Alcohol use: No  . Drug use: No  . Sexual activity: Yes  Other Topics Concern  . Not on file  Social History Narrative  . Not on file   Social Determinants of Health   Financial Resource Strain: Not on file  Food Insecurity: Not on file  Transportation Needs: Not on file  Physical Activity: Not on file  Stress: Not on file  Social Connections: Not on file  Intimate Partner Violence: Not on file     Allergies  Allergen Reactions  . Metoprolol Other (See Comments)    "Patient slept for whole day after starting medication this week.  Patient does not want to take this med.  Not sure if med causes hypotension, but could not get out of bed for any reason"  . Other Other (See Comments)    ANESTHESIA-CAUSES CARDIAC ARREST AND LUNGS COLLAPSED IN 1988, 2008.    . Latex Itching    Swell up  . Pork-Derived Products Palpitations and Other (See Comments)    Also raises Blood pressure-Patient avoids pork and pork products     Outpatient Medications Prior to Visit  Medication Sig Dispense Refill  . aspirin EC 81 MG tablet Take 81 mg by mouth daily. Swallow whole.    Marland Kitchen atorvastatin (LIPITOR) 40 MG tablet TAKE 1 TABLET (40 MG TOTAL) BY MOUTH DAILY. 30 tablet 0  . diltiazem (CARDIZEM CD) 120 MG 24 hr capsule Take 1 capsule (120 mg total) by mouth daily. 30 capsule 0  . acetaminophen (TYLENOL) 650 MG CR tablet Take 650 mg by mouth every 8 (eight) hours as needed for pain.    .  Blood Glucose Monitoring Suppl (TRUE METRIX METER) w/Device KIT Use to measure blood sugar twice a day 1 kit 0  . glucose blood (TRUE METRIX BLOOD GLUCOSE TEST) test strip Use as instructed 100 each 12  . Liniments (PAIN RELIEF EX) Apply 1 application topically 4 (four) times daily as needed (pain). (Patient not taking: Reported on 11/02/2020)    . TRUEplus Lancets 28G MISC Use to measure blood sugar twice a day (Patient not taking: Reported on 11/02/2020) 100 each 1  . Calcium Carbonate-Vitamin D (CALCIUM 500 + D PO) Take 1 tablet by mouth daily.  (Patient not taking: Reported  on 11/02/2020)    . clindamycin-benzoyl peroxide (BENZACLIN) gel Apply topically 2 (two) times daily. To affected areas of the back (Patient not taking: Reported on 11/02/2020) 25 g 0  . diclofenac Sodium (VOLTAREN) 1 % GEL Apply 4 g topically 4 (four) times daily. (Patient not taking: Reported on 11/02/2020) 100 g 3  . gabapentin (NEURONTIN) 300 MG capsule Take 1 capsule (300 mg total) by mouth 3 (three) times daily. (Patient not taking: No sig reported) 90 capsule 3  . Insulin Glargine (BASAGLAR KWIKPEN) 100 UNIT/ML Inject 40 Units into the skin daily. 12 mL 1  . insulin lispro (HUMALOG) 100 UNIT/ML KwikPen 5 units twice a day with two largest meals of the day (Patient not taking: Reported on 11/02/2020) 12 mL 1  . Insulin Pen Needle (B-D UF III MINI PEN NEEDLES) 31G X 5 MM MISC Use as instructed. Inject into the skin four times per day. 100 each 3  . meclizine (ANTIVERT) 25 MG tablet Take 1 tablet (25 mg total) by mouth 3 (three) times daily as needed for dizziness. (Patient not taking: Reported on 11/02/2020) 30 tablet 0  . nicotine (NICODERM CQ - DOSED IN MG/24 HOURS) 21 mg/24hr patch Place 1 patch (21 mg total) onto the skin daily. (Patient not taking: Reported on 11/02/2020) 28 patch 0  . potassium chloride (KLOR-CON) 10 MEQ tablet Take 1 tablet (10 mEq total) by mouth daily for 21 days. (Patient not taking: Reported on 11/02/2020) 21  tablet 0   No facility-administered medications prior to visit.      Review of Systems  Constitutional: Positive for appetite change and fatigue.  HENT: Positive for hearing loss. Negative for congestion, ear discharge, ear pain, sinus pressure, sinus pain, sneezing, sore throat, tinnitus, trouble swallowing and voice change.   Eyes: Positive for visual disturbance.  Respiratory: Negative.   Cardiovascular: Positive for palpitations. Negative for chest pain and leg swelling.  Gastrointestinal: Positive for abdominal distention.       Abd hernia  Endocrine: Positive for cold intolerance, heat intolerance, polydipsia and polyuria. Negative for polyphagia.  Genitourinary: Negative for dysuria, enuresis, flank pain, vaginal discharge and vaginal pain.  Musculoskeletal: Positive for back pain.  Skin: Positive for rash.       Rash on back  Neurological: Positive for dizziness, weakness and numbness. Negative for tremors, syncope, light-headedness and headaches.  Psychiatric/Behavioral: Negative.        Objective:   Physical Exam  There were no vitals filed for this visit.  No exam this is a phone No results found.       Assessment & Plan:  I personally reviewed all images and lab data in the [facility] system as well as any outside material available during this office visit and agree with the  radiology impressions.   Poorly controlled type 2 diabetes mellitus with peripheral neuropathy (HCC) Poorly controlled type 2 diabetes  Switch to oral Farxiga 10 mg daily and Metformin 1000 mg twice daily  Discontinue insulin for now  Bring the patient back in the office short-term to reassess  Arthritis We will perform x-rays when the patient comes to the office  Hypertension Continue diltiazem for now  History of atrial fibrillation Refer back to cardiology for assessment   Diagnoses and all orders for this visit:  Arthritis  PAF (paroxysmal atrial fibrillation) (HCC) -      diltiazem (CARDIZEM CD) 120 MG 24 hr capsule; Take 1 capsule (120 mg total) by mouth daily.  Mixed hyperlipidemia -  atorvastatin (LIPITOR) 40 MG tablet; Take 1 tablet (40 mg total) by mouth daily.  Chronic bilateral low back pain without sciatica -     DG Lumbar Spine Complete; Future  Chronic pain of both knees -     DG Knee Complete 4 Views Left; Future -     DG Knee Complete 4 Views Right; Future  Bilateral hip pain -     DG HIPS BILAT WITH PELVIS 3-4 VIEWS; Future  History of atrial fibrillation -     Ambulatory referral to Cardiology  Poorly controlled type 2 diabetes mellitus with peripheral neuropathy ([place])  Primary hypertension  Other orders -     dapagliflozin propanediol (FARXIGA) 10 MG TABS tablet; Take 1 tablet (10 mg total) by mouth daily before breakfast. -     metFORMIN (GLUCOPHAGE) 1000 MG tablet; Take 1 tablet (1,000 mg total) by mouth 2 (two) times daily with a meal. -     aspirin EC 81 MG tablet; Take 1 tablet (81 mg total) by mouth daily. Swallow whole.    Follow Up Instructions: Patient is a short-term follow-up will be held in the office face-to-face and as well the patient will have a referral back to cardiology for assessment   I discussed the assessment and treatment plan with the patient. The patient was provided an opportunity to ask questions and all were answered. The patient agreed with the plan and demonstrated an understanding of the instructions.   The patient was advised to call back or seek an in-person evaluation if the symptoms worsen or if the condition fails to improve as anticipated.  I provided 20    minutes of non-face-to-face time during this encounter  including  median intraservice time , review of notes, labs, imaging, medications  and explaining diagnosis and management to the patient .    Asencion Noble, MD

## 2020-11-02 NOTE — Progress Notes (Deleted)
Subjective:    Patient ID: Anna Golden, female    DOB: 1961-04-11, 60 y.o.   MRN: [id number]  This is a 60 year old female who comes in to reestablish to the health and wellness clinic she is not been seen since February of this year.  Prior history of diabetes type 2 hypertension asthma history in the past of paroxysmal atrial fibrillation which is now on chronic normal sinus rhythm.  Previously on anticoagulation but has been off since August 2020 and has yet to see cardiology in follow-up and referral then was made.  Active smoking use is noted.  Recently fell injuring the right hip and lower back.  She has been off insulin products since February of this year.  On arrival A1c is greater than 9 blood glucose greater than 200.  She does note polydipsia and polyphagia.  Notes abdominal distention as well.  She had been on the Pradaxa previously and also Xarelto previously.  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  Note patient has refused to receive flu vaccine in the past and refuses today  The patient has received her Covid vaccine and is fully vaccinated  Patient complains of a rash on the back  11/02/2020  Hypertension Hypertension well controlled on diltiazem will not renew lisinopril she has been off this for some time  Poorly controlled type 2 diabetes mellitus with peripheral neuropathy ([place]) Poorly controlled type 2 diabetes A1c greater than 9 off all insulin products  Will resume insulin Basaglar 40 units at bedtime and Humalog 5 units twice daily with the 2 largest meals of the day  Insulin needles and glucose testing supplies given to patient  Patient given  instruction as to the self injection process of the insulin products by clinical pharmacist  Return to see clinical pharmacist in 2 weeks Dr. Joya Gaskins in 1 month  Arthritis Chronic arthritis and now lumbar back pain and right hip pain  Obtain x-rays of the right hip and lower back  Apply Voltaren gel topically to the back and begin gabapentin 300 mg 3 times daily  Obesity Obesity with BMI of 30  Appropriate diabetic diet given  Smoking    Current smoking consumption amount: 4 cigarettes daily  Dicsussion on advise to quit smoking and smoking impacts: Cardiovascular health impacts  Patient's willingness to quit: Willing to quit  Methods to quit smoking discussed: Behavioral modification nicotine replacement  Medication management of smoking session drugs discussed: Nicotine patches  Resources provided:  AVS   Setting quit date not established  Follow-up arranged 1 month   Time spent counseling the patient: 5 minutes   History of atrial fibrillation Appear to be paroxysmal in the past now in sinus rhythm was on anticoagulation now off  Referral to cardiology  Folliculitis Topical BenzaClin given   Himani was seen today for annual exam.  Diagnoses and all orders for this visit:  Poorly controlled type 2 diabetes mellitus with peripheral neuropathy (HCC) -     Glucose (CBG), Fasting -     HgB A1c -     Microalbumin / creatinine urine ratio -     Comprehensive metabolic panel -  CBC with Differential/Platelet -     Insulin Pen Needle (B-D UF III MINI PEN NEEDLES) 31G X 5 MM MISC; Use as instructed. Inject into the skin four times per day.  Mixed hyperlipidemia -     atorvastatin (LIPITOR) 40 MG tablet; Take 1 tablet (40 mg total) by mouth daily.  PAF (paroxysmal atrial fibrillation) (HCC) -     diltiazem (CARDIZEM CD) 120 MG 24 hr capsule; Take 1 capsule (120 mg total) by mouth daily.  Colon cancer screening -     Fecal occult blood,  imunochemical  Encounter for screening mammogram for malignant neoplasm of breast -     MM DIGITAL SCREENING BILATERAL; Future  Chronic right-sided low back pain without sciatica -     DG Lumbar Spine Complete; Future  Primary hypertension  Type 1 diabetes mellitus with hyperglycemia (HCC)  Arthritis  Class 1 obesity due to excess calories with serious comorbidity and body mass index (BMI) of 30.0 to 30.9 in adult  Smoking  History of atrial fibrillation  Folliculitis  Other orders -     potassium chloride (KLOR-CON) 10 MEQ tablet; Take 1 tablet (10 mEq total) by mouth daily for 21 days. -     Insulin Glargine (BASAGLAR KWIKPEN) 100 UNIT/ML; Inject 40 Units into the skin daily. -     insulin lispro (HUMALOG) 100 UNIT/ML KwikPen; 5 units twice a day with two largest meals of the day -     Blood Glucose Monitoring Suppl (TRUE METRIX METER) w/Device KIT; Use to measure blood sugar twice a day -     TRUEplus Lancets 28G MISC; Use to measure blood sugar twice a day -     glucose blood (TRUE METRIX BLOOD GLUCOSE TEST) test strip; Use as instructed -     nicotine (NICODERM CQ - DOSED IN MG/24 HOURS) 21 mg/24hr patch; Place 1 patch (21 mg total) onto the skin daily. -     diclofenac Sodium (VOLTAREN) 1 % GEL; Apply 4 g topically 4 (four) times daily. -     gabapentin (NEURONTIN) 300 MG capsule; Take 1 capsule (300 mg total) by mouth 3 (three) times daily. -     clindamycin-benzoyl peroxide (BENZACLIN) gel; Apply topically 2 (two) times daily. To affected areas of the back -     Pneumococcal polysaccharide vaccine 23-valent greater than or equal to 2yo subcutaneous/IM -     Flu Vaccine QUAD 6+ mos PF IM (Fluarix Quad PF)   Will obtain for the patient Pneumovax and flu vaccine this visit  Patient has had Covid vaccine already will come back for tetanus vaccine at a later date  BenzaClin given for folliculitis on the back topically  Referral to cardiology made to determine status of  need for anticoagulation with history of atrial fibrillation now in sinus rhythm   Past Medical History:  Diagnosis Date  . Acute asthma exacerbation 03/07/2014  . Arthritis   . Asthma   . DKA (diabetic ketoacidoses) 03/07/2014  . DM (diabetes mellitus) ([place]) 04/24/2013  . Dysmenorrhea 01/23/2013  . Fatty liver disease, nonalcoholic   . Hypertension   . Leiomyoma of uterus, unspecified 01/23/2013  . Leukocytosis   . Menorrhagia with regular cycle 01/23/2013  . Obesity   . PAF (paroxysmal atrial fibrillation) ([place])    a. Dx 03/2013, spont converted to NSR.  Marland Kitchen Sickle cell trait ([place])   . Smoking 03/18/2014     Family History  Problem Relation Age of Onset  . Heart disease Mother   .  Diabetes Mother   . Sickle cell anemia Mother   . Cancer Father        colon     Social History   Socioeconomic History  . Marital status: Legally Separated    Spouse name: Not on file  . Number of children: Not on file  . Years of education: Not on file  . Highest education level: Not on file  Occupational History  . Not on file  Tobacco Use  . Smoking status: Current Every Day Smoker    Packs/day: 0.25    Types: Cigarettes  . Smokeless tobacco: Never Used  Vaping Use  . Vaping Use: Never used  Substance and Sexual Activity  . Alcohol use: No  . Drug use: No  . Sexual activity: Yes  Other Topics Concern  . Not on file  Social History Narrative  . Not on file   Social Determinants of Health   Financial Resource Strain: Not on file  Food Insecurity: Not on file  Transportation Needs: Not on file  Physical Activity: Not on file  Stress: Not on file  Social Connections: Not on file  Intimate Partner Violence: Not on file     Allergies  Allergen Reactions  . Metoprolol Other (See Comments)    "Patient slept for whole day after starting medication this week.  Patient does not want to take this med.  Not sure if med causes hypotension, but could not get out of bed for any reason"   . Other Other (See Comments)    ANESTHESIA-CAUSES CARDIAC ARREST AND LUNGS COLLAPSED IN 1988, 2008.    . Latex Itching    Swell up  . Pork-Derived Products Palpitations and Other (See Comments)    Also raises Blood pressure-Patient avoids pork and pork products     Outpatient Medications Prior to Visit  Medication Sig Dispense Refill  . acetaminophen (TYLENOL) 650 MG CR tablet Take 650 mg by mouth every 8 (eight) hours as needed for pain.    Marland Kitchen atorvastatin (LIPITOR) 40 MG tablet TAKE 1 TABLET (40 MG TOTAL) BY MOUTH DAILY. 30 tablet 0  . Blood Glucose Monitoring Suppl (TRUE METRIX METER) w/Device KIT Use to measure blood sugar twice a day 1 kit 0  . Calcium Carbonate-Vitamin D (CALCIUM 500 + D PO) Take 1 tablet by mouth daily.     . clindamycin-benzoyl peroxide (BENZACLIN) gel Apply topically 2 (two) times daily. To affected areas of the back 25 g 0  . diclofenac Sodium (VOLTAREN) 1 % GEL Apply 4 g topically 4 (four) times daily. 100 g 3  . diltiazem (CARDIZEM CD) 120 MG 24 hr capsule Take 1 capsule (120 mg total) by mouth daily. 30 capsule 0  . gabapentin (NEURONTIN) 300 MG capsule Take 1 capsule (300 mg total) by mouth 3 (three) times daily. 90 capsule 3  . glucose blood (TRUE METRIX BLOOD GLUCOSE TEST) test strip Use as instructed 100 each 12  . Insulin Glargine (BASAGLAR KWIKPEN) 100 UNIT/ML Inject 40 Units into the skin daily. 12 mL 1  . insulin lispro (HUMALOG) 100 UNIT/ML KwikPen 5 units twice a day with two largest meals of the day 12 mL 1  . Insulin Pen Needle (B-D UF III MINI PEN NEEDLES) 31G X 5 MM MISC Use as instructed. Inject into the skin four times per day. 100 each 3  . Liniments (PAIN RELIEF EX) Apply 1 application topically 4 (four) times daily as needed (pain).    . meclizine (ANTIVERT) 25 MG  tablet Take 1 tablet (25 mg total) by mouth 3 (three) times daily as needed for dizziness. 30 tablet 0  . nicotine (NICODERM CQ - DOSED IN MG/24 HOURS) 21 mg/24hr patch Place 1 patch  (21 mg total) onto the skin daily. 28 patch 0  . potassium chloride (KLOR-CON) 10 MEQ tablet Take 1 tablet (10 mEq total) by mouth daily for 21 days. 21 tablet 0  . TRUEplus Lancets 28G MISC Use to measure blood sugar twice a day 100 each 1   No facility-administered medications prior to visit.      Review of Systems  Constitutional: Positive for appetite change and fatigue.  HENT: Positive for hearing loss. Negative for congestion, ear discharge, ear pain, sinus pressure, sinus pain, sneezing, sore throat, tinnitus, trouble swallowing and voice change.   Eyes: Positive for visual disturbance.  Respiratory: Negative.   Cardiovascular: Positive for palpitations. Negative for chest pain and leg swelling.  Gastrointestinal: Positive for abdominal distention.       Abd hernia  Endocrine: Positive for cold intolerance, heat intolerance, polydipsia and polyuria. Negative for polyphagia.  Genitourinary: Negative for dysuria, enuresis, flank pain, vaginal discharge and vaginal pain.  Musculoskeletal: Positive for back pain.  Skin: Positive for rash.       Rash on back  Neurological: Positive for dizziness, syncope, weakness, light-headedness, numbness and headaches. Negative for tremors.  Psychiatric/Behavioral: Negative.        Objective:   Physical Exam  There were no vitals filed for this visit.  Gen: Pleasant, well-nourished, in no distress,  normal affect  ENT: No lesions,  mouth clear,  oropharynx clear, no postnasal drip, poor dentition  Neck: No JVD, no TMG, no carotid bruits  Lungs: No use of accessory muscles, no dullness to percussion, clear without rales or rhonchi  Cardiovascular: RRR, heart sounds normal, no murmur or gallops, no peripheral edema  Abdomen: soft and NT, no HSM,  BS normal, right rectus muscle hernia that is reducible  Musculoskeletal: No deformities, no cyanosis or clubbing  Neuro: alert, non focal  Skin: Warm, folliculitis upper back No results  found.       Assessment & Plan:  I personally reviewed all images and lab data in the [facility] system as well as any outside material available during this office visit and agree with the  radiology impressions.   No problem-specific Assessment & Plan notes found for this encounter.   There are no diagnoses linked to this encounter. Will obtain for the patient Pneumovax and flu vaccine this visit  Patient has had Covid vaccine already will come back for tetanus vaccine at a later date  BenzaClin given for folliculitis on the back topically  Referral to cardiology made to determine status of need for anticoagulation with history of atrial fibrillation now in sinus rhythm

## 2020-11-02 NOTE — Assessment & Plan Note (Signed)
We will perform x-rays when the patient comes to the office

## 2020-11-09 MED FILL — ?METFORMIN HCL 1000 MG TAB: 1000 | 30 days supply | Qty: 60 | Fill #0

## 2020-11-17 ENCOUNTER — Ambulatory Visit: Payer: Medicaid Other | Admitting: Internal Medicine

## 2020-11-27 ENCOUNTER — Other Ambulatory Visit: Payer: Self-pay

## 2020-11-27 MED ORDER — MECLIZINE HCL 25 MG PO TABS: 25.0000 mg | ORAL_TABLET | Freq: Three times a day (TID) | ORAL | 0 refills | Status: AC

## 2020-12-10 NOTE — Progress Notes (Deleted)
Subjective:    Patient ID: Anna Golden, female    DOB: July 18, 1961, 60 y.o.   MRN: [id number] Virtual Visit via Telephone Note  I connected with Anna Golden on 12/12/20 at  2:30 PM EDT by telephone and verified that I am speaking with the correct person using two identifiers.   Consent:  I discussed the limitations, risks, security and privacy concerns of performing an evaluation and management service by telephone and the availability of in person appointments. I also discussed with the patient that there may be a patient responsible charge related to this service. The patient expressed understanding and agreed to proceed.  Location of patient: Patient's at home0  Location of provider:. I am in the office  Persons participating in the televisit with the patient.    No one else on the call   History of Present Illness: This is a 60 year old female who comes in to reestablish to the health and wellness clinic she is not been seen since February of this year.  Prior history of diabetes type 2 hypertension asthma history in the past of paroxysmal atrial fibrillation which is now on chronic normal sinus rhythm.  Previously on anticoagulation but has been off since August 2020 and has yet to see cardiology in follow-up and referral then was made.  Active smoking use is noted.  Recently fell injuring the right hip and lower back.  She has been off insulin products since February of this year.  On arrival A1c is greater than 9 blood glucose greater than 200.  She does note polydipsia and polyphagia.  Notes abdominal distention as well.  She had been on the Pradaxa previously and also Xarelto previously.  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  Note patient has refused to receive flu vaccine in the past and refuses today  The patient has received her Covid vaccine and is fully vaccinated  Patient complains of a rash on the back  11/02/2020 This patient is seen by way of a telephone visit she does not have technology to do video.  The patient is seen in follow-up post been seen face-to-face but decided to have it phone visit.  She has poorly controlled type 2 diabetes hemoglobin A1c greater than 9.  We attempted to get her on insulin products at the last visit unfortunately she stopped taking the insulin because she does not like to self administer with needle and insulin medications.  She was given extensive instruction by our clinical pharmacist during the last visit but she determined not to follow through on this.  She also complains of severe arthritis and low lumbar pain and hip pain she never followed through on the x-rays of the hip.  She states the Voltaren and gabapentin have not helped the pain.  Patient was also to see cardiology in follow-up of her intermittent atrial fibrillation however she never had the return follow-up established.  Patient has been compliant with diltiazem and states her blood pressures been stable on this.   4/18  Poorly controlled type 2 diabetes mellitus with peripheral neuropathy ([place]) Poorly controlled type 2 diabetes  Switch to oral Farxiga 10 mg daily  and Metformin 1000 mg twice daily  Discontinue insulin for now  Bring the patient back in the office short-term to reassess  Arthritis We will perform x-rays when the patient comes to the office  Hypertension Continue diltiazem for now  History of atrial fibrillation Refer back to cardiology for assessment   Diagnoses and all orders for this visit:  Arthritis  PAF (paroxysmal atrial fibrillation) (HCC) -     diltiazem (CARDIZEM CD) 120 MG 24 hr capsule; Take 1 capsule  (120 mg total) by mouth daily.  Mixed hyperlipidemia -     atorvastatin (LIPITOR) 40 MG tablet; Take 1 tablet (40 mg total) by mouth daily.  Chronic bilateral low back pain without sciatica -     DG Lumbar Spine Complete; Future  Chronic pain of both knees -     DG Knee Complete 4 Views Left; Future -     DG Knee Complete 4 Views Right; Future  Bilateral hip pain -     DG HIPS BILAT WITH PELVIS 3-4 VIEWS; Future  History of atrial fibrillation -     Ambulatory referral to Cardiology  Poorly controlled type 2 diabetes mellitus with peripheral neuropathy (HCC)  Primary hypertension  Other orders -     dapagliflozin propanediol (FARXIGA) 10 MG TABS tablet; Take 1 tablet (10 mg total) by mouth daily before breakfast. -     metFORMIN (GLUCOPHAGE) 1000 MG tablet; Take 1 tablet (1,000 mg total) by mouth 2 (two) times daily with a meal. -     aspirin EC 81 MG tablet; Take 1 tablet (81 mg total) by mouth daily. Swallow whole.      Past Medical History:  Diagnosis Date  . Acute asthma exacerbation 03/07/2014  . Arthritis   . Asthma   . DKA (diabetic ketoacidoses) 03/07/2014  . DM (diabetes mellitus) (HCC) 04/24/2013  . Dysmenorrhea 01/23/2013  . Fatty liver disease, nonalcoholic   . Hypertension   . Leiomyoma of uterus, unspecified 01/23/2013  . Leukocytosis   . Menorrhagia with regular cycle 01/23/2013  . Obesity   . PAF (paroxysmal atrial fibrillation) (HCC)    a. Dx 03/2013, spont converted to NSR.  . Sickle cell trait (HCC)   . Smoking 03/18/2014     Family History  Problem Relation Age of Onset  . Heart disease Mother   . Diabetes Mother   . Sickle cell anemia Mother   . Cancer Father        colon     Social History   Socioeconomic History  . Marital status: Legally Separated    Spouse name: Not on file  . Number of children: Not on file  . Years of education: Not on file  . Highest education level: Not on file  Occupational History  . Not on file  Tobacco Use   . Smoking status: Current Every Day Smoker    Packs/day: 0.25    Types: Cigarettes  . Smokeless tobacco: Never Used  Vaping Use  . Vaping Use: Never used  Substance and Sexual Activity  . Alcohol use: No  . Drug use: No  . Sexual activity: Yes  Other Topics Concern  . Not on file  Social History Narrative  . Not on file   Social Determinants of Health   Financial Resource Strain: Not on file  Food Insecurity: Not on file  Transportation Needs: Not on file  Physical Activity: Not on file  Stress: Not on file  Social Connections: Not on file  Intimate   Partner Violence: Not on file     Allergies  Allergen Reactions  . Metoprolol Other (See Comments)    "Patient slept for whole day after starting medication this week.  Patient does not want to take this med.  Not sure if med causes hypotension, but could not get out of bed for any reason"  . Other Other (See Comments)    ANESTHESIA-CAUSES CARDIAC ARREST AND LUNGS COLLAPSED IN 1988, 2008.    . Latex Itching    Swell up  . Pork-Derived Products Palpitations and Other (See Comments)    Also raises Blood pressure-Patient avoids pork and pork products     Outpatient Medications Prior to Visit  Medication Sig Dispense Refill  . acetaminophen (TYLENOL) 650 MG CR tablet Take 650 mg by mouth every 8 (eight) hours as needed for pain.    Marland Kitchen aspirin EC 81 MG tablet Take 1 tablet (81 mg total) by mouth daily. Swallow whole. 100 tablet 1  . atorvastatin (LIPITOR) 40 MG tablet TAKE 1 TABLET (40 MG TOTAL) BY MOUTH DAILY. 90 tablet 1  . Blood Glucose Monitoring Suppl (TRUE METRIX METER) w/Device KIT Use to measure blood sugar twice a day 1 kit 0  . dapagliflozin propanediol (FARXIGA) 10 MG TABS tablet Take 1 tablet (10 mg total) by mouth daily before breakfast. 30 tablet 1  . diltiazem (CARDIZEM CD) 120 MG 24 hr capsule TAKE 1 CAPSULE (120 MG TOTAL) BY MOUTH DAILY. 90 capsule 1  . diltiazem (CARDIZEM CD) 120 MG 24 hr capsule TAKE 1 CAPSULE  BY MOUTH DAILY. 30 capsule 0  . glucose blood (TRUE METRIX BLOOD GLUCOSE TEST) test strip Use as instructed 100 each 12  . Liniments (PAIN RELIEF EX) Apply 1 application topically 4 (four) times daily as needed (pain). (Patient not taking: Reported on 11/02/2020)    . meclizine (ANTIVERT) 25 MG tablet TAKE 1 TABLET (25MG TOTAL) BY MOUTH 3 (THREE) TIMES DAILY AS NEEDED FOR DIZZINESS. 30 tablet 0  . metFORMIN (GLUCOPHAGE) 1000 MG tablet TAKE 1 TABLET (1,000 MG TOTAL) BY MOUTH 2 (TWO) TIMES DAILY WITH A MEAL. 120 tablet 3  . TRUEplus Lancets 28G MISC USE TO MEASURE BLOOD SUGAR TWICE A DAY (Patient not taking: Reported on 11/02/2020) 100 each 1   No facility-administered medications prior to visit.      Review of Systems  Constitutional: Positive for appetite change and fatigue.  HENT: Positive for hearing loss. Negative for congestion, ear discharge, ear pain, sinus pressure, sinus pain, sneezing, sore throat, tinnitus, trouble swallowing and voice change.   Eyes: Positive for visual disturbance.  Respiratory: Negative.   Cardiovascular: Positive for palpitations. Negative for chest pain and leg swelling.  Gastrointestinal: Positive for abdominal distention.       Abd hernia  Endocrine: Positive for cold intolerance, heat intolerance, polydipsia and polyuria. Negative for polyphagia.  Genitourinary: Negative for dysuria, enuresis, flank pain, vaginal discharge and vaginal pain.  Musculoskeletal: Positive for back pain.  Skin: Positive for rash.       Rash on back  Neurological: Positive for dizziness, weakness and numbness. Negative for tremors, syncope, light-headedness and headaches.  Psychiatric/Behavioral: Negative.        Objective:   Physical Exam  There were no vitals filed for this visit.  No exam this is a phone No results found.       Assessment & Plan:  I personally reviewed all images and lab data in the [facility] system as well as any outside material available during  this  office visit and agree with the  radiology impressions.   No problem-specific Assessment & Plan notes found for this encounter.   There are no diagnoses linked to this encounter.  Follow Up Instructions: Patient is a short-term follow-up will be held in the office face-to-face and as well the patient will have a referral back to cardiology for assessment   I discussed the assessment and treatment plan with the patient. The patient was provided an opportunity to ask questions and all were answered. The patient agreed with the plan and demonstrated an understanding of the instructions.   The patient was advised to call back or seek an in-person evaluation if the symptoms worsen or if the condition fails to improve as anticipated.  I provided 20    minutes of non-face-to-face time during this encounter  including  median intraservice time , review of notes, labs, imaging, medications  and explaining diagnosis and management to the patient .    Patrick Wright, MD   

## 2020-12-12 ENCOUNTER — Ambulatory Visit: Payer: Self-pay | Attending: Critical Care Medicine | Admitting: Critical Care Medicine

## 2020-12-12 ENCOUNTER — Other Ambulatory Visit: Payer: Self-pay

## 2020-12-12 DIAGNOSIS — Z5329 Procedure and treatment not carried out because of patient's decision for other reasons: Secondary | ICD-10-CM

## 2020-12-12 DIAGNOSIS — Z91199 Patient's noncompliance with other medical treatment and regimen due to unspecified reason: Secondary | ICD-10-CM

## 2020-12-12 NOTE — Progress Notes (Signed)
Patient ID: Anna Golden, female   DOB: [birth year], 60 y.o.   MRN: [id number] I tried to call this patient twice and she did not answer this was a no-show visit

## 2020-12-26 ENCOUNTER — Ambulatory Visit: Payer: Self-pay | Admitting: Internal Medicine

## 2020-12-26 NOTE — Progress Notes (Deleted)
Cardiology Office Note:    Date:  12/26/2020   ID:  Anna Golden, DOB 01/13/1961, MRN [id number]  PCP:  Elsie Stain, MD   [place]  Cardiologist:  No primary care provider on file. *** Advanced Practice Provider:  No care team member to display Electrophysiologist:  None  {Press F2 to show EP APP, CHF, sleep or structural heart MD               :[id number]}  { Click here to update then REFRESH NOTE - MD (PCP) or APP (Team Member)  Change PCP Type for MD, Specialty for APP is either Cardiology or Clinical Cardiac Electrophysiology  :[id number]}   CC: *** Consulted for the evaluation of AF at the behest of Elsie Stain, MD  NEED ECG  History of Present Illness:    Anna Golden is a 60 y.o. female with a hx of HTN, DM, HLD, PAF, and Tobacco Abuse who presents for evaluation 12/26/20.  Patient notes that she is feeling ***.  Has had no chest pain, chest pressure, chest tightness, chest stinging ***.  Discomfort occurs with ***, worsens with ***, and improves with ***.  Patient exertion notable for *** with *** and feels no symptoms.  No shortness of breath, DOE ***.  No PND or orthopnea***.  No bendopnea***, weight gain***, leg swelling ***, or abdominal swelling***.  No syncope or near syncope ***. Notes *** no palpitations or funny heart beats.     Patient reports prior cardiac testing including *** echo, *** stress test, *** heart catheterizations, *** cardioversion, *** ablations.  No history of ***pre-eclampsia, early menarche, or prematurity.  No Fen-Phen or drug use***.  Ambulatory BP ***.   Past Medical History:  Diagnosis Date  . Acute asthma exacerbation 03/07/2014  . Arthritis   . Asthma   . DKA (diabetic ketoacidoses) 03/07/2014  . DM (diabetes mellitus) ([place]) 04/24/2013  . Dysmenorrhea 01/23/2013  . Fatty liver disease, nonalcoholic   . Hypertension   . Leiomyoma of uterus, unspecified 01/23/2013  . Leukocytosis   . Menorrhagia  with regular cycle 01/23/2013  . Obesity   . PAF (paroxysmal atrial fibrillation) ([place])    a. Dx 03/2013, spont converted to NSR.  Marland Kitchen Sickle cell trait ([place])   . Smoking 03/18/2014    Past Surgical History:  Procedure Laterality Date  . HERNIA REPAIR      Current Medications: No outpatient medications have been marked as taking for the 12/26/20 encounter (Appointment) with Werner Lean, MD.     Allergies:   Metoprolol, Other, Latex, and Pork-derived products   Social History   Socioeconomic History  . Marital status: Legally Separated    Spouse name: Not on file  . Number of children: Not on file  . Years of education: Not on file  . Highest education level: Not on file  Occupational History  . Not on file  Tobacco Use  . Smoking status: Current Every Day Smoker    Packs/day: 0.25    Types: Cigarettes  . Smokeless tobacco: Never Used  Vaping Use  . Vaping Use: Never used  Substance and Sexual Activity  . Alcohol use: No  . Drug use: No  . Sexual activity: Yes  Other Topics Concern  . Not on file  Social History Narrative  . Not on file   Social Determinants of Health   Financial Resource Strain: Not on file  Food Insecurity: Not on file  Transportation Needs:  Not on file  Physical Activity: Not on file  Stress: Not on file  Social Connections: Not on file     Family History: The patient's family history includes Cancer in her father; Diabetes in her mother; Heart disease in her mother; Sickle cell anemia in her mother.  ROS:   Please see the history of present illness.     All other systems reviewed and are negative.  EKGs/Labs/Other Studies Reviewed:    The following studies were reviewed today: ***  EKG:  EKG is *** ordered today.  The ekg ordered today demonstrates *** 12/26/20 08/30/20: SR rate 85 WNL   Transthoracic Echocardiogram: Date: 04/25/2013 Results: Left ventricle: The cavity size was normal. Wall thickness  was normal. Systolic  function was normal. The estimated  ejection fraction was in the range of 60% to 65%. Wall  motion was normal; there were no regional wall motion  abnormalities. Features are consistent with a pseudonormal  left ventricular filling pattern, with concomitant abnormal  relaxation and increased filling pressure (grade 2 diastolic  dysfunction).  Normal LA size  NonCardiac CT: Date:01/08/12 Results: No CAC or aortic atherosclerosis  NM Stress Testing : Date: 05/15/2013 Results: Normal myovue study with no evidence of ischemia or infarction   Recent Labs: 08/30/2020: ALT 15; BUN 9; Creatinine, Ser 0.74; Hemoglobin 13.6; Platelets 268; Potassium 3.7; Sodium 139  Recent Lipid Panel    Component Value Date/Time   CHOL 136 04/07/2019 1557   TRIG 129 04/07/2019 1557   HDL 48 04/07/2019 1557   CHOLHDL 2.8 04/07/2019 1557   CHOLHDL 2.5 04/08/2014 1059   VLDL 18 04/08/2014 1059   LDLCALC 62 04/07/2019 1557     Risk Assessment/Calculations:   {Does this patient have ATRIAL FIBRILLATION?:[phone]}   Physical Exam:    VS:  LMP 12/20/2014     Wt Readings from Last 3 Encounters:  08/01/20 173 lb (78.5 kg)  06/09/20 182 lb 1.6 oz (82.6 kg)  03/28/20 182 lb (82.6 kg)     GEN: *** Well nourished, well developed in no acute distress HEENT: Normal NECK: No JVD; No carotid bruits LYMPHATICS: No lymphadenopathy CARDIAC: ***RRR, no murmurs, rubs, gallops RESPIRATORY:  Clear to auscultation without rales, wheezing or rhonchi  ABDOMEN: Soft, non-tender, non-distended MUSCULOSKELETAL:  No edema; No deformity  SKIN: Warm and dry NEUROLOGIC:  Alert and oriented x 3 PSYCHIATRIC:  Normal affect   ASSESSMENT:    No diagnosis found. PLAN:    In order of problems listed above:  *** Atrial Fibrillation,  - Risk factors include *** - CHADSVASC=***. - TSH ***, imaging notable for *** left atrial dilation - discussed alcohol and exercise as preventive factors - OSA Risk ***, Epworth  Sleepiness Scale great than *** - Will get obtain TTE.   - Continue anticoagulation with ***.  - Continue rate control with *** - Rhythm control options *** - Considerations for DCCV *** - Consideration for ablation ***  Tobacco Abuse - discussed the dangers of tobacco use, both inhaled and oral, which include, but are not limited to cardiovascular disease, increased cancer risk of multiple types of cancer, COPD, peripheral arterial disease, strokes. - counseled on the benefits of smoking cessation. - firmly advised to quit.  - we also reviewed strategies to maximize success, including:  Removing cigarettes and smoking materials from environment   Stress management  Substitution of other forms of reinforcement (the one cigarette a day approach)  Support of family/friends and group smoking cessation  Selecting a quit date  Patient provided contact information for [facility] or 1-800-QUIT-NOW  Patient provided with 's 8 free smoking cessation classes: ([phone] and [employer]fr  -*** Patient has no disordered sleep, insomnia, abnormal dreams, no suicidal ideation on no antidepressant medications, ***CrCL gerater than 30 and would like to try varenicline.  Discussed risks, including nausea and sleep issues, and benefits and will trial a 12 week course: 0.5 mg once daily for three days, then 0.5 mg twice daily for four days, and then 1 mg twice daily for the remainder of a 12-week course. -*** Patient has had no history of seizure, no suicidal ideation and no antidepressant medications,and would like to try bupropion.  Discussed risk, including seizure threshold/insomnia/dry mouth/headache, and benefits and will trial a 12 week course:  150 mg/day for three days, then 150 mg twice daily.    {Are you ordering a CV Procedure (e.g. stress test, cath, DCCV, TEE, etc)?   Press F2        :[id number]}    Medication Adjustments/Labs and Tests Ordered: Current  medicines are reviewed at length with the patient today.  Concerns regarding medicines are outlined above.  No orders of the defined types were placed in this encounter.  No orders of the defined types were placed in this encounter.   There are no Patient Instructions on file for this visit.   Signed, Werner Lean, MD  12/26/2020 12:45 PM    [place]

## 2021-01-02 ENCOUNTER — Ambulatory Visit: Payer: Self-pay | Admitting: Cardiology

## 2021-01-18 ENCOUNTER — Other Ambulatory Visit: Payer: Self-pay

## 2021-01-18 MED FILL — Diltiazem HCl Coated Beads Cap ER 24HR 120 MG: ORAL | 30 days supply | Qty: 30 | Fill #0 | Status: AC

## 2021-01-18 MED FILL — Metformin HCl Tab 1000 MG: ORAL | 30 days supply | Qty: 60 | Fill #0 | Status: AC

## 2021-01-18 MED FILL — Atorvastatin Calcium Tab 40 MG (Base Equivalent): ORAL | 30 days supply | Qty: 30 | Fill #0 | Status: AC

## 2021-01-20 ENCOUNTER — Other Ambulatory Visit: Payer: Self-pay

## 2021-02-07 ENCOUNTER — Other Ambulatory Visit: Payer: Self-pay

## 2021-02-14 ENCOUNTER — Ambulatory Visit: Payer: Medicaid Other | Admitting: Critical Care Medicine

## 2021-02-14 NOTE — Progress Notes (Deleted)
Subjective:    Patient ID: Anna Golden, female    DOB: February 04, 1961, 60 y.o.   MRN: [id number] Virtual Visit via Telephone Note  I connected with Anna Golden on 02/14/21 at  4:00 PM EDT by telephone and verified that I am speaking with the correct person using two identifiers.   Consent:  I discussed the limitations, risks, security and privacy concerns of performing an evaluation and management service by telephone and the availability of in person appointments. I also discussed with the patient that there may be a patient responsible charge related to this service. The patient expressed understanding and agreed to proceed.  Location of patient: Patient's at home0  Location of provider:. I am in the office  Persons participating in the televisit with the patient.    No one else on the call   History of Present Illness: This is a 60 year old female who comes in to reestablish to the health and wellness clinic she is not been seen since February of this year.  Prior history of diabetes type 2 hypertension asthma history in the past of paroxysmal atrial fibrillation which is now on chronic normal sinus rhythm.  Previously on anticoagulation but has been off since August 2020 and has yet to see cardiology in follow-up and referral then was made.  Active smoking use is noted.  Recently fell injuring the right hip and lower back.  She has been off insulin products since February of this year.  On arrival A1c is greater than 9 blood glucose greater than 200.  She does note polydipsia and polyphagia.  Notes abdominal distention as well.  She had been on the Pradaxa previously and also Xarelto previously.  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  Note patient has refused to receive flu vaccine in the past and refuses today  The patient has received her Covid vaccine and is fully vaccinated  Patient complains of a rash on the back  11/02/2020 This patient is seen by way of a telephone visit she does not have technology to do video.  The patient is seen in follow-up post been seen face-to-face but decided to have it phone visit.  She has poorly controlled type 2 diabetes hemoglobin A1c greater than 9.  We attempted to get her on insulin products at the last visit unfortunately she stopped taking the insulin because she does not like to self administer with needle and insulin medications.  She was given extensive instruction by our clinical pharmacist during the last visit but she determined not to follow through on this.  She also complains of severe arthritis and low lumbar pain and hip pain she never followed through on the x-rays of the hip.  She states the Voltaren and gabapentin have not helped the pain.  Patient was also to see cardiology in follow-up of her intermittent atrial fibrillation however she never had the return follow-up established.  Patient has been compliant with diltiazem and states her blood pressures been stable on this.  02/14/2021  Poorly controlled type 2 diabetes mellitus with peripheral neuropathy ([place]) Poorly controlled type 2 diabetes  Switch to oral Farxiga 10 mg daily and  Metformin 1000 mg twice daily  Discontinue insulin for now  Bring the patient back in the office short-term to reassess  Arthritis We will perform x-rays when the patient comes to the office  Hypertension Continue diltiazem for now  History of atrial fibrillation Refer back to cardiology for assessment   Diagnoses and all orders for this visit:  Arthritis  PAF (paroxysmal atrial fibrillation) (HCC) -     diltiazem (CARDIZEM CD) 120 MG 24 hr capsule; Take 1 capsule  (120 mg total) by mouth daily.  Mixed hyperlipidemia -     atorvastatin (LIPITOR) 40 MG tablet; Take 1 tablet (40 mg total) by mouth daily.  Chronic bilateral low back pain without sciatica -     DG Lumbar Spine Complete; Future  Chronic pain of both knees -     DG Knee Complete 4 Views Left; Future -     DG Knee Complete 4 Views Right; Future  Bilateral hip pain -     DG HIPS BILAT WITH PELVIS 3-4 VIEWS; Future  History of atrial fibrillation -     Ambulatory referral to Cardiology  Poorly controlled type 2 diabetes mellitus with peripheral neuropathy ([place])  Primary hypertension  Other orders -     dapagliflozin propanediol (FARXIGA) 10 MG TABS tablet; Take 1 tablet (10 mg total) by mouth daily before breakfast. -     metFORMIN (GLUCOPHAGE) 1000 MG tablet; Take 1 tablet (1,000 mg total) by mouth 2 (two) times daily with a meal. -     aspirin EC 81 MG tablet; Take 1 tablet (81 mg total) by mouth daily. Swallow whole.    Past Medical History:  Diagnosis Date   Acute asthma exacerbation 03/07/2014   Arthritis    Asthma    DKA (diabetic ketoacidoses) 03/07/2014   DM (diabetes mellitus) ([place]) 04/24/2013   Dysmenorrhea 01/23/2013   Fatty liver disease, nonalcoholic    Hypertension    Leiomyoma of uterus, unspecified 01/23/2013   Leukocytosis    Menorrhagia with regular cycle 01/23/2013   Obesity    PAF (paroxysmal atrial fibrillation) ([place])    a. Dx 03/2013, spont converted to NSR.   Sickle cell trait ([place])    Smoking 03/18/2014     Family History  Problem Relation Age of Onset   Heart disease Mother    Diabetes Mother    Sickle cell anemia Mother    Cancer Father        colon     Social History   Socioeconomic History   Marital status: Legally Separated    Spouse name: Not on file   Number of children: Not on file   Years of education: Not on file   Highest education level: Not on file  Occupational History   Not on file  Tobacco Use   Smoking status: Every  Day    Packs/day: 0.25    Pack years: 0.00    Types: Cigarettes   Smokeless tobacco: Never  Vaping Use   Vaping Use: Never used  Substance and Sexual Activity   Alcohol use: No   Drug use: No   Sexual activity: Yes  Other Topics Concern   Not on file  Social History Narrative   Not on file   Social Determinants of Health   Financial Resource Strain: Not on file  Food Insecurity: Not on file  Transportation Needs: Not on file  Physical Activity: Not on file  Stress: Not on file  Social Connections: Not on file  Intimate  Partner Violence: Not on file     Allergies  Allergen Reactions   Metoprolol Other (See Comments)    "Patient slept for whole day after starting medication this week.  Patient does not want to take this med.  Not sure if med causes hypotension, but could not get out of bed for any reason"   Other Other (See Comments)    ANESTHESIA-CAUSES [place], 2008.     Latex Itching    Swell up   Pork-Derived Products Palpitations and Other (See Comments)    Also raises Blood pressure-Patient avoids pork and pork products     Outpatient Medications Prior to Visit  Medication Sig Dispense Refill   acetaminophen (TYLENOL) 650 MG CR tablet Take 650 mg by mouth every 8 (eight) hours as needed for pain.     aspirin EC 81 MG tablet Take 1 tablet (81 mg total) by mouth daily. Swallow whole. 100 tablet 1   atorvastatin (LIPITOR) 40 MG tablet TAKE 1 TABLET (40 MG TOTAL) BY MOUTH DAILY. 90 tablet 1   Blood Glucose Monitoring Suppl (TRUE METRIX METER) w/Device KIT Use to measure blood sugar twice a day 1 kit 0   dapagliflozin propanediol (FARXIGA) 10 MG TABS tablet Take 1 tablet (10 mg total) by mouth daily before breakfast. 30 tablet 1   diltiazem (CARDIZEM CD) 120 MG 24 hr capsule TAKE 1 CAPSULE (120 MG TOTAL) BY MOUTH DAILY. 90 capsule 1   diltiazem (CARDIZEM CD) 120 MG 24 hr capsule TAKE 1 CAPSULE BY MOUTH DAILY. 30 capsule 0   glucose  blood (TRUE METRIX BLOOD GLUCOSE TEST) test strip Use as instructed 100 each 12   Liniments (PAIN RELIEF EX) Apply 1 application topically 4 (four) times daily as needed (pain). (Patient not taking: Reported on 11/02/2020)     meclizine (ANTIVERT) 25 MG tablet TAKE 1 TABLET (25MG TOTAL) BY MOUTH 3 (THREE) TIMES DAILY AS NEEDED FOR DIZZINESS. 30 tablet 0   metFORMIN (GLUCOPHAGE) 1000 MG tablet TAKE 1 TABLET (1,000 MG TOTAL) BY MOUTH 2 (TWO) TIMES DAILY WITH A MEAL. 120 tablet 3   TRUEplus Lancets 28G MISC USE TO MEASURE BLOOD SUGAR TWICE A DAY (Patient not taking: Reported on 11/02/2020) 100 each 1   No facility-administered medications prior to visit.      Review of Systems  Neurological:  Negative for syncope, light-headedness and headaches.      Objective:   Physical Exam  There were no vitals filed for this visit.  No exam this is a phone No results found.       Assessment & Plan:  I personally reviewed all images and lab data in the [facility] system as well as any outside material available during this office visit and agree with the  radiology impressions.   No problem-specific Assessment & Plan notes found for this encounter.   There are no diagnoses linked to this encounter.   Follow Up Instructions: Patient is a short-term follow-up will be held in the office face-to-face and as well the patient will have a referral back to cardiology for assessment   I discussed the assessment and treatment plan with the patient. The patient was provided an opportunity to ask questions and all were answered. The patient agreed with the plan and demonstrated an understanding of the instructions.   The patient was advised to call back or seek an in-person evaluation if the symptoms worsen or if the condition fails to improve as anticipated.  I provided 20    minutes of non-face-to-face time during this encounter  including  median intraservice time , review of notes, labs, imaging, medications   and explaining diagnosis and management to the patient .    Asencion Noble, MD

## 2021-02-26 MED FILL — Metformin HCl Tab 1000 MG: ORAL | 30 days supply | Qty: 60 | Fill #1 | Status: AC

## 2021-02-26 MED FILL — Diltiazem HCl Coated Beads Cap ER 24HR 120 MG: ORAL | 30 days supply | Qty: 30 | Fill #1 | Status: AC

## 2021-02-26 MED FILL — Atorvastatin Calcium Tab 40 MG (Base Equivalent): ORAL | 30 days supply | Qty: 30 | Fill #1 | Status: AC

## 2021-02-28 ENCOUNTER — Other Ambulatory Visit: Payer: Self-pay

## 2021-03-01 ENCOUNTER — Other Ambulatory Visit: Payer: Self-pay

## 2021-03-09 ENCOUNTER — Telehealth: Payer: Self-pay

## 2021-03-09 NOTE — Telephone Encounter (Signed)
Spoke to patient daughter Raul Del) and she would like for her mother to be scheduled for August mobile mammogram event.

## 2021-04-04 ENCOUNTER — Ambulatory Visit: Payer: Medicaid Other | Admitting: Critical Care Medicine

## 2021-04-11 ENCOUNTER — Other Ambulatory Visit: Payer: Self-pay

## 2021-04-11 MED FILL — Diltiazem HCl Coated Beads Cap ER 24HR 120 MG: ORAL | 30 days supply | Qty: 30 | Fill #2 | Status: AC

## 2021-04-11 MED FILL — Metformin HCl Tab 1000 MG: ORAL | 30 days supply | Qty: 60 | Fill #2 | Status: AC

## 2021-04-11 MED FILL — Atorvastatin Calcium Tab 40 MG (Base Equivalent): ORAL | 30 days supply | Qty: 30 | Fill #2 | Status: AC

## 2021-04-28 ENCOUNTER — Other Ambulatory Visit: Payer: Self-pay

## 2021-05-02 ENCOUNTER — Other Ambulatory Visit: Payer: Self-pay

## 2021-05-11 ENCOUNTER — Ambulatory Visit: Payer: Medicaid Other | Admitting: Critical Care Medicine

## 2021-05-11 NOTE — Progress Notes (Deleted)
Established Patient Office Visit  Subjective:  Patient ID: Anna Golden, female    DOB: 1961-02-28  Age: 60 y.o. MRN: [id number]  CC: No chief complaint on file.   HPI KAEDYN BELARDO presents for ***  Past Medical History:  Diagnosis Date   Acute asthma exacerbation 03/07/2014   Arthritis    Asthma    DKA (diabetic ketoacidoses) 03/07/2014   DM (diabetes mellitus) ([place]) 04/24/2013   Dysmenorrhea 01/23/2013   Fatty liver disease, nonalcoholic    Hypertension    Leiomyoma of uterus, unspecified 01/23/2013   Leukocytosis    Menorrhagia with regular cycle 01/23/2013   Obesity    PAF (paroxysmal atrial fibrillation) ([place])    a. Dx 03/2013, spont converted to NSR.   Sickle cell trait ([place])    Smoking 03/18/2014    Past Surgical History:  Procedure Laterality Date   HERNIA REPAIR      Family History  Problem Relation Age of Onset   Heart disease Mother    Diabetes Mother    Sickle cell anemia Mother    Cancer Father        colon    Social History   Socioeconomic History   Marital status: Legally Separated    Spouse name: Not on file   Number of children: Not on file   Years of education: Not on file   Highest education level: Not on file  Occupational History   Not on file  Tobacco Use   Smoking status: Every Day    Packs/day: 0.25    Types: Cigarettes   Smokeless tobacco: Never  Vaping Use   Vaping Use: Never used  Substance and Sexual Activity   Alcohol use: No   Drug use: No   Sexual activity: Yes  Other Topics Concern   Not on file  Social History Narrative   Not on file   Social Determinants of Health   Financial Resource Strain: Not on file  Food Insecurity: Not on file  Transportation Needs: Not on file  Physical Activity: Not on file  Stress: Not on file  Social Connections: Not on file  Intimate Partner Violence: Not on file    Outpatient Medications Prior to Visit  Medication Sig Dispense Refill   acetaminophen (TYLENOL) 650 MG CR  tablet Take 650 mg by mouth every 8 (eight) hours as needed for pain.     aspirin EC 81 MG tablet Take 1 tablet (81 mg total) by mouth daily. Swallow whole. 100 tablet 1   atorvastatin (LIPITOR) 40 MG tablet TAKE 1 TABLET (40 MG TOTAL) BY MOUTH DAILY. 90 tablet 1   Blood Glucose Monitoring Suppl (TRUE METRIX METER) w/Device KIT Use to measure blood sugar twice a day 1 kit 0   dapagliflozin propanediol (FARXIGA) 10 MG TABS tablet Take 1 tablet (10 mg total) by mouth daily before breakfast. 30 tablet 1   diltiazem (CARDIZEM CD) 120 MG 24 hr capsule TAKE 1 CAPSULE (120 MG TOTAL) BY MOUTH DAILY. 90 capsule 1   diltiazem (CARDIZEM CD) 120 MG 24 hr capsule TAKE 1 CAPSULE BY MOUTH DAILY. 30 capsule 0   glucose blood (TRUE METRIX BLOOD GLUCOSE TEST) test strip Use as instructed 100 each 12   Liniments (PAIN RELIEF EX) Apply 1 application topically 4 (four) times daily as needed (pain). (Patient not taking: Reported on 11/02/2020)     meclizine (ANTIVERT) 25 MG tablet TAKE 1 TABLET (25MG TOTAL) BY MOUTH 3 (THREE) TIMES DAILY AS NEEDED FOR DIZZINESS.  30 tablet 0   metFORMIN (GLUCOPHAGE) 1000 MG tablet TAKE 1 TABLET (1,000 MG TOTAL) BY MOUTH 2 (TWO) TIMES DAILY WITH A MEAL. 120 tablet 3   TRUEplus Lancets 28G MISC USE TO MEASURE BLOOD SUGAR TWICE A DAY (Patient not taking: Reported on 11/02/2020) 100 each 1   No facility-administered medications prior to visit.    Allergies  Allergen Reactions   Metoprolol Other (See Comments)    "Patient slept for whole day after starting medication this week.  Patient does not want to take this med.  Not sure if med causes hypotension, but could not get out of bed for any reason"   Other Other (See Comments)    ANESTHESIA-CAUSES [place], 2008.     Latex Itching    Swell up   Pork-Derived Products Palpitations and Other (See Comments)    Also raises Blood pressure-Patient avoids pork and pork products    ROS Review of Systems     Objective:    Physical Exam  LMP 12/20/2014  Wt Readings from Last 3 Encounters:  08/01/20 173 lb (78.5 kg)  06/09/20 182 lb 1.6 oz (82.6 kg)  03/28/20 182 lb (82.6 kg)     Health Maintenance Due  Topic Date Due   OPHTHALMOLOGY EXAM  Never done   TETANUS/TDAP  Never done   MAMMOGRAM  Never done   Zoster Vaccines- Shingrix (1 of 2) Never done   PAP SMEAR-Modifier  01/24/2016   COVID-19 Vaccine (3 - Booster for Moderna series) 10/21/2020   HEMOGLOBIN A1C  01/30/2021   INFLUENZA VACCINE  03/27/2021   Pneumococcal Vaccine 57-28 Years old (2 - PCV) 08/01/2021    There are no preventive care reminders to display for this patient.  Lab Results  Component Value Date   TSH 1.267 04/08/2014   Lab Results  Component Value Date   WBC 11.3 (H) 08/30/2020   HGB 13.6 08/30/2020   HCT 39.2 08/30/2020   MCV 85.4 08/30/2020   PLT 268 08/30/2020   Lab Results  Component Value Date   NA 139 08/30/2020   K 3.7 08/30/2020   CO2 24 08/30/2020   GLUCOSE 149 (H) 08/30/2020   BUN 9 08/30/2020   CREATININE 0.74 08/30/2020   BILITOT 0.4 08/30/2020   ALKPHOS 38 08/30/2020   AST 16 08/30/2020   ALT 15 08/30/2020   PROT 7.1 08/30/2020   ALBUMIN 3.6 08/30/2020   CALCIUM 9.6 08/30/2020   ANIONGAP 10 08/30/2020   Lab Results  Component Value Date   CHOL 136 04/07/2019   Lab Results  Component Value Date   HDL 48 04/07/2019   Lab Results  Component Value Date   LDLCALC 62 04/07/2019   Lab Results  Component Value Date   TRIG 129 04/07/2019   Lab Results  Component Value Date   CHOLHDL 2.8 04/07/2019   Lab Results  Component Value Date   HGBA1C 9.0 (A) 08/01/2020      Assessment & Plan:   Problem List Items Addressed This Visit   None   No orders of the defined types were placed in this encounter.   Follow-up: No follow-ups on file.    Asencion Noble, MD

## 2021-06-27 ENCOUNTER — Other Ambulatory Visit: Payer: Self-pay

## 2021-07-04 ENCOUNTER — Ambulatory Visit: Payer: Self-pay | Admitting: Critical Care Medicine

## 2021-07-04 NOTE — Progress Notes (Incomplete)
Established Patient Office Visit  Subjective:  Patient ID: Anna Golden, female    DOB: [birth year]  Age: 60 y.o. MRN: [id number]  CC: No chief complaint on file.   HPI Anna Golden presents for ***  Past Medical History:  Diagnosis Date   Acute asthma exacerbation 03/07/2014   Arthritis    Asthma    DKA (diabetic ketoacidoses) 03/07/2014   DM (diabetes mellitus) ([place]) 04/24/2013   Dysmenorrhea 01/23/2013   Fatty liver disease, nonalcoholic    Hypertension    Leiomyoma of uterus, unspecified 01/23/2013   Leukocytosis    Menorrhagia with regular cycle 01/23/2013   Obesity    PAF (paroxysmal atrial fibrillation) ([place])    a. Dx 03/2013, spont converted to NSR.   Sickle cell trait ([place])    Smoking 03/18/2014    Past Surgical History:  Procedure Laterality Date   HERNIA REPAIR      Family History  Problem Relation Age of Onset   Heart disease Mother    Diabetes Mother    Sickle cell anemia Mother    Cancer Father        colon    Social History   Socioeconomic History   Marital status: Legally Separated    Spouse name: Not on file   Number of children: Not on file   Years of education: Not on file   Highest education level: Not on file  Occupational History   Not on file  Tobacco Use   Smoking status: Every Day    Packs/day: 0.25    Types: Cigarettes   Smokeless tobacco: Never  Vaping Use   Vaping Use: Never used  Substance and Sexual Activity   Alcohol use: No   Drug use: No   Sexual activity: Yes  Other Topics Concern   Not on file  Social History Narrative   Not on file   Social Determinants of Health   Financial Resource Strain: Not on file  Food Insecurity: Not on file  Transportation Needs: Not on file  Physical Activity: Not on file  Stress: Not on file  Social Connections: Not on file  Intimate Partner Violence: Not on file    Outpatient Medications Prior to Visit  Medication Sig Dispense Refill    acetaminophen (TYLENOL) 650 MG CR tablet Take 650 mg by mouth every 8 (eight) hours as needed for pain.     aspirin EC 81 MG tablet Take 1 tablet (81 mg total) by mouth daily. Swallow whole. 100 tablet 1   atorvastatin (LIPITOR) 40 MG tablet TAKE 1 TABLET (40 MG TOTAL) BY MOUTH DAILY. 90 tablet 1   Blood Glucose Monitoring Suppl (TRUE METRIX METER) w/Device KIT Use to measure blood sugar twice a day 1 kit 0   dapagliflozin propanediol (FARXIGA) 10 MG TABS tablet Take 1 tablet (10 mg total) by mouth daily before breakfast. 30 tablet 1   diltiazem (CARDIZEM CD) 120 MG 24 hr capsule TAKE 1 CAPSULE (120 MG TOTAL) BY MOUTH DAILY. 90 capsule 1   diltiazem (CARDIZEM CD) 120 MG 24 hr capsule TAKE 1 CAPSULE BY MOUTH DAILY. 30 capsule 0   glucose blood (TRUE METRIX BLOOD GLUCOSE TEST) test strip Use as instructed 100 each 12   Liniments (PAIN RELIEF EX) Apply 1 application topically 4 (four) times daily as needed (pain). (Patient not taking: Reported on 11/02/2020)     meclizine (ANTIVERT) 25 MG tablet TAKE 1 TABLET (25MG TOTAL) BY MOUTH 3 (THREE) TIMES DAILY AS NEEDED FOR DIZZINESS.  30 tablet 0   metFORMIN (GLUCOPHAGE) 1000 MG tablet TAKE 1 TABLET (1,000 MG TOTAL) BY MOUTH 2 (TWO) TIMES DAILY WITH A MEAL. 120 tablet 3   TRUEplus Lancets 28G MISC USE TO MEASURE BLOOD SUGAR TWICE A DAY (Patient not taking: Reported on 11/02/2020) 100 each 1   No facility-administered medications prior to visit.    Allergies  Allergen Reactions   Metoprolol Other (See Comments)    "Patient slept for whole day after starting medication this week.  Patient does not want to take this med.  Not sure if med causes hypotension, but could not get out of bed for any reason"   Other Other (See Comments)    ANESTHESIA-CAUSES [place], 2008.     Latex Itching    Swell up   Pork-Derived Products Palpitations and Other (See Comments)    Also raises Blood pressure-Patient avoids pork and  pork products    ROS Review of Systems    Objective:    Physical Exam  LMP 12/20/2014  Wt Readings from Last 3 Encounters:  08/01/20 173 lb (78.5 kg)  06/09/20 182 lb 1.6 oz (82.6 kg)  03/28/20 182 lb (82.6 kg)     Health Maintenance Due  Topic Date Due   OPHTHALMOLOGY EXAM  Never done   TETANUS/TDAP  Never done   MAMMOGRAM  Never done   Zoster Vaccines- Shingrix (1 of 2) Never done   PAP SMEAR-Modifier  01/24/2016   COVID-19 Vaccine (3 - Booster for Moderna series) 07/16/2020   HEMOGLOBIN A1C  01/30/2021   INFLUENZA VACCINE  03/27/2021   Pneumococcal Vaccine 31-61 Years old (2 - PCV) 08/01/2021   URINE MICROALBUMIN  08/01/2021    There are no preventive care reminders to display for this patient.  Lab Results  Component Value Date   TSH 1.267 04/08/2014   Lab Results  Component Value Date   WBC 11.3 (H) 08/30/2020   HGB 13.6 08/30/2020   HCT 39.2 08/30/2020   MCV 85.4 08/30/2020   PLT 268 08/30/2020   Lab Results  Component Value Date   NA 139 08/30/2020   K 3.7 08/30/2020   CO2 24 08/30/2020   GLUCOSE 149 (H) 08/30/2020   BUN 9 08/30/2020   CREATININE 0.74 08/30/2020   BILITOT 0.4 08/30/2020   ALKPHOS 38 08/30/2020   AST 16 08/30/2020   ALT 15 08/30/2020   PROT 7.1 08/30/2020   ALBUMIN 3.6 08/30/2020   CALCIUM 9.6 08/30/2020   ANIONGAP 10 08/30/2020   Lab Results  Component Value Date   CHOL 136 04/07/2019   Lab Results  Component Value Date   HDL 48 04/07/2019   Lab Results  Component Value Date   LDLCALC 62 04/07/2019   Lab Results  Component Value Date   TRIG 129 04/07/2019   Lab Results  Component Value Date   CHOLHDL 2.8 04/07/2019   Lab Results  Component Value Date   HGBA1C 9.0 (A) 08/01/2020      Assessment & Plan:   Problem List Items Addressed This Visit   None   No orders of the defined types were placed in this encounter.   Follow-up: No follow-ups on file.    Asencion Noble, MD

## 2021-08-28 ENCOUNTER — Other Ambulatory Visit: Payer: Self-pay

## 2021-10-02 ENCOUNTER — Encounter (HOSPITAL_COMMUNITY): Payer: Self-pay

## 2021-10-02 ENCOUNTER — Emergency Department (HOSPITAL_COMMUNITY)
Admission: EM | Admit: 2021-10-02 | Discharge: 2021-10-02 | Disposition: A | Discharge status: Home / Self Care | Payer: Self-pay | Attending: Emergency Medicine | Admitting: Emergency Medicine

## 2021-10-02 ENCOUNTER — Emergency Department (HOSPITAL_COMMUNITY): Payer: Self-pay

## 2021-10-02 DIAGNOSIS — E119 Type 2 diabetes mellitus without complications: Secondary | ICD-10-CM | POA: Insufficient documentation

## 2021-10-02 DIAGNOSIS — R630 Anorexia: Secondary | ICD-10-CM | POA: Insufficient documentation

## 2021-10-02 DIAGNOSIS — M25561 Pain in right knee: Secondary | ICD-10-CM | POA: Insufficient documentation

## 2021-10-02 DIAGNOSIS — M549 Dorsalgia, unspecified: Secondary | ICD-10-CM | POA: Insufficient documentation

## 2021-10-02 DIAGNOSIS — W01198A Fall on same level from slipping, tripping and stumbling with subsequent striking against other object, initial encounter: Secondary | ICD-10-CM | POA: Insufficient documentation

## 2021-10-02 DIAGNOSIS — Z7982 Long term (current) use of aspirin: Secondary | ICD-10-CM | POA: Insufficient documentation

## 2021-10-02 DIAGNOSIS — Z794 Long term (current) use of insulin: Secondary | ICD-10-CM | POA: Insufficient documentation

## 2021-10-02 DIAGNOSIS — S0181XA Laceration without foreign body of other part of head, initial encounter: Secondary | ICD-10-CM | POA: Insufficient documentation

## 2021-10-02 DIAGNOSIS — M25551 Pain in right hip: Secondary | ICD-10-CM | POA: Insufficient documentation

## 2021-10-02 DIAGNOSIS — W19XXXA Unspecified fall, initial encounter: Secondary | ICD-10-CM

## 2021-10-02 DIAGNOSIS — Z91040 Latex allergy status: Secondary | ICD-10-CM | POA: Insufficient documentation

## 2021-10-02 DIAGNOSIS — S0990XA Unspecified injury of head, initial encounter: Secondary | ICD-10-CM

## 2021-10-02 DIAGNOSIS — I10 Essential (primary) hypertension: Secondary | ICD-10-CM | POA: Insufficient documentation

## 2021-10-02 DIAGNOSIS — Z79899 Other long term (current) drug therapy: Secondary | ICD-10-CM | POA: Insufficient documentation

## 2021-10-02 DIAGNOSIS — E876 Hypokalemia: Secondary | ICD-10-CM | POA: Insufficient documentation

## 2021-10-02 DIAGNOSIS — Z7984 Long term (current) use of oral hypoglycemic drugs: Secondary | ICD-10-CM | POA: Insufficient documentation

## 2021-10-02 LAB — I-STAT BETA HCG BLOOD, ED (MC, WL, AP ONLY): I-stat hCG, quantitative: <5 m[IU]/mL (ref <5)

## 2021-10-02 LAB — CBC
HCT: 37.4 % (ref 36.0–46.0)
Hemoglobin: 13.2 g/dL (ref 12.0–15.0)
MCH: 29.3 pg (ref 26.0–34.0)
MCHC: 35.3 g/dL (ref 30.0–36.0)
MCV: 82.9 fL (ref 80.0–100.0)
Platelets: 237 10*3/uL (ref 150–400)
RBC: 4.51 MIL/uL (ref 3.87–5.11)
RDW: 12 % (ref 11.5–15.5)
WBC: 9.9 10*3/uL (ref 4.0–10.5)
nRBC: 0 % (ref 0.0–0.2)

## 2021-10-02 LAB — BASIC METABOLIC PANEL
Anion gap: 11 (ref 5–15)
BUN: 9 mg/dL (ref 6–20)
CO2: 22 mmol/L (ref 22–32)
Calcium: 8.8 mg/dL — ABNORMAL LOW (ref 8.9–10.3)
Chloride: 98 mmol/L (ref 98–111)
Creatinine, Ser: 0.78 mg/dL (ref 0.44–1.00)
GFR, Estimated: >60 mL/min (ref >60)
Glucose, Bld: 466 mg/dL — ABNORMAL HIGH (ref 70–99)
Potassium: 3.4 mmol/L — ABNORMAL LOW (ref 3.5–5.1)
Sodium: 131 mmol/L — ABNORMAL LOW (ref 135–145)

## 2021-10-02 MED ORDER — ACETAMINOPHEN 500 MG PO TABS
1000.0000 mg | ORAL_TABLET | Freq: Once | ORAL | Status: AC
  Administered 2021-10-02: 1000 mg via ORAL
  Filled 2021-10-02: qty 2

## 2021-10-02 MED ORDER — METHOCARBAMOL 500 MG PO TABS
500.0000 mg | ORAL_TABLET | Freq: Two times a day (BID) | ORAL | 0 refills | Status: DC
  Filled 2021-10-02 – 2021-10-20 (×2): qty 20, 10d supply, fill #0

## 2021-10-02 MED ORDER — IBUPROFEN 800 MG PO TABS
800.0000 mg | ORAL_TABLET | Freq: Once | ORAL | Status: AC
  Administered 2021-10-02: 800 mg via ORAL
  Filled 2021-10-02: qty 1

## 2021-10-02 MED ORDER — IBUPROFEN 800 MG PO TABS
800.0000 mg | ORAL_TABLET | Freq: Three times a day (TID) | ORAL | 0 refills | Status: DC
  Filled 2021-10-02 – 2021-10-20 (×2): qty 21, 7d supply, fill #0

## 2021-10-02 MED ORDER — SODIUM CHLORIDE 0.9 % IV BOLUS
1000.0000 mL | Freq: Once | INTRAVENOUS | Status: AC
Start: 2021-10-02 — End: 2021-10-02
  Administered 2021-10-02: 1000 mL via INTRAVENOUS

## 2021-10-02 NOTE — ED Triage Notes (Signed)
Pt BIB [facility] for eval of fall. Pt/family reports she struck her head on an ironing board and then to floor. Unsure how she fell initially. EMS reports R hip pain, ambulatory on scene. Pt reports she did feel as though she was going to pass out about 30 minutes prior to fall. CBG 369, hypertensive w/ hx of same.

## 2021-10-02 NOTE — ED Provider Notes (Signed)
[facility] EMERGENCY DEPARTMENT Provider Note   CSN: [id number] Arrival date & time: 10/02/21  1148     History  Chief Complaint  Patient presents with   Anna Golden    Anna Golden is a 61 y.o. female This is a 61 year old female with a history of AFib, not currently anticoagulated because she is noncompliant, as well as A-fib is paroxysmal nature, diabetes, not currently taking insulin or metformin, hypertension who presents after concern for fall, head injury, right hip pain, back pain.  Patient reports that she did feel lightheaded prior to the fall.  Patient reports similar same around 2 weeks ago.  Patient denies chest pain, shortness of breath, feeling of heart racing prior to the fall.  Patient reports that she has had an upper respiratory infection over the last 1 to 2 weeks, that had slightly decreased intake of food, felt somewhat weak overall.  No history of symptomatic anemia.  No history of cardiogenic syncope in the past.  Patient does report some ongoing pain, soreness of head.   Fall      Home Medications Prior to Admission medications   Medication Sig Start Date End Date Taking? Authorizing Provider  acetaminophen (TYLENOL) 650 MG CR tablet Take 650 mg by mouth every 8 (eight) hours as needed for pain.    [provider]  aspirin EC 81 MG tablet Take 1 tablet (81 mg total) by mouth daily. Swallow whole. 11/02/20   Elsie Stain, MD  atorvastatin (LIPITOR) 40 MG tablet TAKE 1 TABLET (40 MG TOTAL) BY MOUTH DAILY. 11/02/20 11/02/21  Elsie Stain, MD  Blood Glucose Monitoring Suppl (TRUE METRIX METER) w/Device KIT Use to measure blood sugar twice a day 08/01/20   Elsie Stain, MD  dapagliflozin propanediol (FARXIGA) 10 MG TABS tablet Take 1 tablet (10 mg total) by mouth daily before breakfast. 11/09/20   Elsie Stain, MD  diltiazem (CARDIZEM CD) 120 MG 24 hr capsule TAKE 1 CAPSULE (120 MG TOTAL) BY MOUTH DAILY. 11/02/20 11/02/21  Elsie Stain, MD  diltiazem (CARDIZEM CD) 120 MG 24 hr capsule TAKE 1 CAPSULE BY MOUTH DAILY. 06/09/20 06/09/21  Gildardo Pounds, NP  glucose blood (TRUE METRIX BLOOD GLUCOSE TEST) test strip Use as instructed 08/01/20   Elsie Stain, MD  Liniments (PAIN RELIEF EX) Apply 1 application topically 4 (four) times daily as needed (pain). Patient not taking: Reported on 11/02/2020    [provider]  metFORMIN (GLUCOPHAGE) 1000 MG tablet TAKE 1 TABLET (1,000 MG TOTAL) BY MOUTH 2 (TWO) TIMES DAILY WITH A MEAL. 11/02/20 11/02/21  Elsie Stain, MD  gabapentin (NEURONTIN) 300 MG capsule Take 1 capsule (300 mg total) by mouth 3 (three) times daily. Patient not taking: No sig reported 08/01/20 11/02/20  Elsie Stain, MD  insulin aspart protamine- aspart (NOVOLOG MIX 70/30) (70-30) 100 UNIT/ML injection Inject 0.08 mLs (8 Units total) into the skin 2 (two) times daily with a meal. Patient not taking: Reported on 01/15/2015 04/26/14 01/26/15  Lorayne Marek, MD  Insulin Glargine (BASAGLAR KWIKPEN) 100 UNIT/ML Inject 40 Units into the skin daily. 08/01/20 11/02/20  Elsie Stain, MD  insulin lispro (HUMALOG) 100 UNIT/ML KwikPen 5 units twice a day with two largest meals of the day Patient not taking: Reported on 11/02/2020 08/01/20 11/02/20  Elsie Stain, MD  potassium chloride (KLOR-CON) 10 MEQ tablet Take 1 tablet (10 mEq total) by mouth daily for 21 days. Patient not taking: Reported  on 11/02/2020 08/01/20 11/02/20  Elsie Stain, MD      Allergies    Metoprolol, Other, and Latex    Review of Systems   Review of Systems  Skin:  Positive for wound.  Neurological:  Positive for syncope and light-headedness.  All other systems reviewed and are negative.  Physical Exam Updated Vital Signs BP (!) 132/98    Pulse 89    Temp 98.2 F (36.8 C) (Oral)    Resp 16    Ht 5' 3.5" (1.613 m)    Wt 79 kg    LMP 12/20/2014    SpO2 99%    BMI 30.37 kg/m  Physical Exam Vitals and nursing note reviewed.   Constitutional:      General: She is not in acute distress.    Appearance: Normal appearance.  HENT:     Head: Normocephalic and atraumatic.  Eyes:     General:        Right eye: No discharge.        Left eye: No discharge.  Cardiovascular:     Rate and Rhythm: Normal rate and regular rhythm.     Heart sounds: No murmur heard.   No friction rub. No gallop.  Pulmonary:     Effort: Pulmonary effort is normal.     Breath sounds: Normal breath sounds.  Abdominal:     General: Bowel sounds are normal.     Palpations: Abdomen is soft.  Musculoskeletal:     Comments: Patient with intact strength 5 out of 5 bilateral upper and lower extremities.  She does have some tenderness palpation right hip, right knee.  There is some crepitus with motion of the right knee without step-off or deformity.  Intact range of motion throughout.  Patient is able to ambulate with some pain.  Skin:    General: Skin is warm and dry.     Capillary Refill: Capillary refill takes less than 2 seconds.     Comments: Patient with very small perhaps 1 mm linear laceration on right side of forehead, not actively bleeding, full wound length visualized, no evidence of retained foreign body.  There is significant soft tissue edema versus none enlarging hematoma noted surrounding the laceration.  Neurological:     Mental Status: She is alert and oriented to person, place, and time.     Comments: Cranial nerves II through XII grossly intact.  Intact finger-nose, intact heel-to-shin.  Romberg negative, gait normal.  Alert and oriented x3.  Moves all 4 limbs spontaneously, normal coordination.  No pronator drift.  Intact strength 5 out of 5 bilateral upper and lower extremities.  Psychiatric:        Mood and Affect: Mood normal.        Behavior: Behavior normal.    ED Results / Procedures / Treatments   Labs (all labs ordered are listed, but only abnormal results are displayed) Labs Reviewed  BASIC METABOLIC PANEL -  Abnormal; Notable for the following components:      Result Value   Sodium 131 (*)    Potassium 3.4 (*)    Glucose, Bld 466 (*)    Calcium 8.8 (*)    All other components within normal limits  CBC  URINALYSIS, ROUTINE W REFLEX MICROSCOPIC  CBG MONITORING, ED  I-STAT BETA HCG BLOOD, ED ([facility], [facility], [facility] ONLY)    EKG EKG Interpretation  Date/Time:  Monday October 02 2021 11:52:26 EST Ventricular Rate:  90 PR Interval:  140 QRS Duration: 74  QT Interval:  394 QTC Calculation: 481 R Axis:   44 Text Interpretation: Normal sinus rhythm Right atrial enlargement Prolonged QT Abnormal ECG When compared with ECG of 30-Aug-2020 13:10, No significant change since last tracing Confirmed by Dorie Rank [phone]) on 10/02/2021 4:33:31 PM  Radiology DG Lumbar Spine Complete  Result Date: 10/02/2021 CLINICAL DATA:  [place], pain at EXAM: [place] 4+ VIEW COMPARISON:  11/02/2009 FINDINGS: Frontal, bilateral oblique, lateral views are obtained. Five non-rib-bearing lumbar type vertebral bodies are identified, with mild left convex curvature centered at L3. Otherwise alignment is anatomic. There are no acute displaced fractures. Disc spaces are well preserved. Mild lower lumbar facet hypertrophy, greatest at L4-5 and L5-S1. Sacroiliac joints are unremarkable. IMPRESSION: 1. Lower lumbar facet hypertrophy.  No acute fracture. Electronically Signed   By: Randa Ngo M.D.   On: 10/02/2021 17:24   CT Head Wo Contrast  Addendum Date: 10/02/2021   ADDENDUM REPORT: 10/02/2021 18:37 ADDENDUM: Noted in the findings initially but not in the impression: There is a 1.8 cm hypodense right thyroid nodule. A nonemergent ultrasound as an outpatient is recommended for further evaluation. Electronically Signed   By: Maurine Simmering M.D.   On: 10/02/2021 18:37   Result Date: 10/02/2021 CLINICAL DATA:  Head trauma, moderate-severe; Neck trauma, dangerous injury mechanism (Age 108-64y); Facial trauma, blunt EXAM: CT HEAD  WITHOUT CONTRAST CT MAXILLOFACIAL WITHOUT CONTRAST CT CERVICAL SPINE WITHOUT CONTRAST TECHNIQUE: Multidetector CT imaging of the head, cervical spine, and maxillofacial structures were performed using the standard protocol without intravenous contrast. Multiplanar CT image reconstructions of the cervical spine and maxillofacial structures were also generated. RADIATION DOSE REDUCTION: This exam was performed according to the departmental dose-optimization program which includes automated exposure control, adjustment of the mA and/or kV according to patient size and/or use of iterative reconstruction technique. COMPARISON:  Head CT 05/02/2006. FINDINGS: CT HEAD FINDINGS Brain: No evidence of acute intracranial hemorrhage or extra-axial collection.No evidence of mass lesion/concern mass effect.The ventricles are normal in size. Vascular: No hyperdense vessel or unexpected calcification. Skull: Normal. Negative for fracture or focal lesion. Other: None. CT MAXILLOFACIAL FINDINGS Osseous: No fracture or mandibular dislocation. No destructive process. Orbits: Negative. No traumatic or inflammatory finding. Sinuses: There is a small mucous retention cyst in the right maxillary sinus. Soft tissues: There is mild focal swelling of the right lateral maxillary and right forehead soft tissues. CT CERVICAL SPINE FINDINGS Alignment: Straightening of the normal cervical lordosis, likely due to patient positioning. Trace degenerative anterolisthesis at C4-C5. Skull base and vertebrae: There is no acute cervical spine fracture. No aggressive osseous lesion. 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 Either chronic fracture deformity or congenital anomaly involving the left transverse process of C1, unchanged from prior head CT. Soft tissues and spinal canal: No prevertebral fluid or swelling. No visible canal hematoma. Disc levels: There is mild multilevel degenerative disc disease worst at C4-C5 and C5-C6. Multilevel facet  arthropathy, moderate to severe on the left at C3-C4 and C4-C5. Upper chest: Negative Other: There is a 1.8 cm hypodense right thyroid nodule (series 6, image 63). IMPRESSION: No acute intracranial abnormality. No acute facial fracture. Mild focal swelling of the right lateral maxillary and right forehead soft tissues. No acute cervical spine fracture. Multilevel degenerative disc disease and facet arthropathy. Electronically Signed: By: Maurine Simmering M.D. On: 10/02/2021 18:07   CT Cervical Spine Wo Contrast  Addendum Date: 10/02/2021   ADDENDUM REPORT: 10/02/2021 18:37 ADDENDUM: Noted in the findings initially  but not in the impression: There is a 1.8 cm hypodense right thyroid nodule. A nonemergent ultrasound as an outpatient is recommended for further evaluation. Electronically Signed   By: Maurine Simmering M.D.   On: 10/02/2021 18:37   Result Date: 10/02/2021 CLINICAL DATA:  Head trauma, moderate-severe; Neck trauma, dangerous injury mechanism (Age 27-64y); Facial trauma, blunt EXAM: CT HEAD WITHOUT CONTRAST CT MAXILLOFACIAL WITHOUT CONTRAST CT CERVICAL SPINE WITHOUT CONTRAST TECHNIQUE: Multidetector CT imaging of the head, cervical spine, and maxillofacial structures were performed using the standard protocol without intravenous contrast. Multiplanar CT image reconstructions of the cervical spine and maxillofacial structures were also generated. RADIATION DOSE REDUCTION: This exam was performed according to the departmental dose-optimization program which includes automated exposure control, adjustment of the mA and/or kV according to patient size and/or use of iterative reconstruction technique. COMPARISON:  Head CT 05/02/2006. FINDINGS: CT HEAD FINDINGS Brain: No evidence of acute intracranial hemorrhage or extra-axial collection.No evidence of mass lesion/concern mass effect.The ventricles are normal in size. Vascular: No hyperdense vessel or unexpected calcification. Skull: Normal. Negative for fracture or  focal lesion. Other: None. CT MAXILLOFACIAL FINDINGS Osseous: No fracture or mandibular dislocation. No destructive process. Orbits: Negative. No traumatic or inflammatory finding. Sinuses: There is a small mucous retention cyst in the right maxillary sinus. Soft tissues: There is mild focal swelling of the right lateral maxillary and right forehead soft tissues. CT CERVICAL SPINE FINDINGS Alignment: Straightening of the normal cervical lordosis, likely due to patient positioning. Trace degenerative anterolisthesis at C4-C5. Skull base and vertebrae: There is no acute cervical spine fracture. No aggressive osseous lesion. 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 Either chronic fracture deformity or congenital anomaly involving the left transverse process of C1, unchanged from prior head CT. Soft tissues and spinal canal: No prevertebral fluid or swelling. No visible canal hematoma. Disc levels: There is mild multilevel degenerative disc disease worst at C4-C5 and C5-C6. Multilevel facet arthropathy, moderate to severe on the left at C3-C4 and C4-C5. Upper chest: Negative Other: There is a 1.8 cm hypodense right thyroid nodule (series 6, image 63). IMPRESSION: No acute intracranial abnormality. No acute facial fracture. Mild focal swelling of the right lateral maxillary and right forehead soft tissues. No acute cervical spine fracture. Multilevel degenerative disc disease and facet arthropathy. Electronically Signed: By: Maurine Simmering M.D. On: 10/02/2021 18:07   DG Knee Complete 4 Views Right  Result Date: 10/02/2021 CLINICAL DATA:  [place], right knee pain EXAM: RIGHT KNEE - COMPLETE 4+ VIEW COMPARISON:  None FINDINGS: Frontal, bilateral oblique, and lateral views are obtained. No fracture, subluxation, or dislocation. Small ossific density adjacent to the medial femoral condyle consistent with sequela of chronic injury. There is mild 3 compartmental joint space narrowing and osteophyte formation, greatest in  the patellofemoral compartment, consistent with osteoarthritis. No joint effusion. Soft tissues are unremarkable. IMPRESSION: 1. 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 2. No acute fracture. Electronically Signed   By: Randa Ngo M.D.   On: 10/02/2021 17:26   DG Hip Unilat W or Wo Pelvis 2-3 Views Right  Result Date: 10/02/2021 CLINICAL DATA:  [place], right hip pain EXAM: DG HIP (WITH OR WITHOUT PELVIS) 2-3V RIGHT COMPARISON:  11/02/2009 FINDINGS: Frontal view of the pelvis as well as frontal and frogleg lateral views of the right hip are obtained. No fracture, subluxation, or dislocation. Mild symmetrical bilateral hip osteoarthritis. Sacroiliac joints are unremarkable. Lower lumbar facet hypertrophy. IMPRESSION: 1. Mild bilateral hip osteoarthritis.  No acute fracture. Electronically Signed   By: Randa Ngo M.D.   On: 10/02/2021 17:27   CT Maxillofacial Wo Contrast  Addendum Date: 10/02/2021   ADDENDUM REPORT: 10/02/2021 18:37 ADDENDUM: Noted in the findings initially but not in the impression: There is a 1.8 cm hypodense right thyroid nodule. A nonemergent ultrasound as an outpatient is recommended for further evaluation. Electronically Signed   By: Maurine Simmering M.D.   On: 10/02/2021 18:37   Result Date: 10/02/2021 CLINICAL DATA:  Head trauma, moderate-severe; Neck trauma, dangerous injury mechanism (Age 33-64y); Facial trauma, blunt EXAM: CT HEAD WITHOUT CONTRAST CT MAXILLOFACIAL WITHOUT CONTRAST CT CERVICAL SPINE WITHOUT CONTRAST TECHNIQUE: Multidetector CT imaging of the head, cervical spine, and maxillofacial structures were performed using the standard protocol without intravenous contrast. Multiplanar CT image reconstructions of the cervical spine and maxillofacial structures were also generated. RADIATION DOSE REDUCTION: This exam was performed according to the departmental dose-optimization program which includes automated exposure control,  adjustment of the mA and/or kV according to patient size and/or use of iterative reconstruction technique. COMPARISON:  Head CT 05/02/2006. FINDINGS: CT HEAD FINDINGS Brain: No evidence of acute intracranial hemorrhage or extra-axial collection.No evidence of mass lesion/concern mass effect.The ventricles are normal in size. Vascular: No hyperdense vessel or unexpected calcification. Skull: Normal. Negative for fracture or focal lesion. Other: None. CT MAXILLOFACIAL FINDINGS Osseous: No fracture or mandibular dislocation. No destructive process. Orbits: Negative. No traumatic or inflammatory finding. Sinuses: There is a small mucous retention cyst in the right maxillary sinus. Soft tissues: There is mild focal swelling of the right lateral maxillary and right forehead soft tissues. CT CERVICAL SPINE FINDINGS Alignment: Straightening of the normal cervical lordosis, likely due to patient positioning. Trace degenerative anterolisthesis at C4-C5. Skull base and vertebrae: There is no acute cervical spine fracture. No aggressive osseous lesion. 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 Either chronic fracture deformity or congenital anomaly involving the left transverse process of C1, unchanged from prior head CT. Soft tissues and spinal canal: No prevertebral fluid or swelling. No visible canal hematoma. Disc levels: There is mild multilevel degenerative disc disease worst at C4-C5 and C5-C6. Multilevel facet arthropathy, moderate to severe on the left at C3-C4 and C4-C5. Upper chest: Negative Other: There is a 1.8 cm hypodense right thyroid nodule (series 6, image 63). IMPRESSION: No acute intracranial abnormality. No acute facial fracture. Mild focal swelling of the right lateral maxillary and right forehead soft tissues. No acute cervical spine fracture. Multilevel degenerative disc disease and facet arthropathy. Electronically Signed: By: Maurine Simmering M.D. On: 10/02/2021 18:07    Procedures Procedures     Medications Ordered in ED Medications  sodium chloride 0.9 % bolus 1,000 mL (1,000 mLs Intravenous New Bag/Given 10/02/21 1822)  ibuprofen (ADVIL) tablet 800 mg (800 mg Oral Given 10/02/21 1745)  acetaminophen (TYLENOL) tablet 1,000 mg (1,000 mg Oral Given 10/02/21 1744)    ED Course/ Medical Decision Making/ A&P                            Medical Decision Making Amount and/or Complexity of Data Reviewed Radiology: ordered.  Risk OTC drugs. Prescription drug management.  I discussed this case with my attending physician who cosigned this note including patient's presenting symptoms, physical exam, and planned diagnostics and interventions. Attending physician stated agreement with plan or made changes to plan which were implemented.   This patient presents to the ED for concern of  syncope and collapse, as well as forehead, and right-sided injuries secondary to fall, this involves an extensive number of treatment options, and is a complaint that carries with it a high risk of complications and morbidity. The emergent differential diagnosis includes, but is not limited to, absence seizure, cardiogenic syncope, hypotension, fatigue and collapse, versus simple mechanical fall.  After the fall, also emergently concerned about acute head bleed, cervical spine fracture, other fractures or injuries, as well as forehead laceration.   Co morbidities that complicate the patient evaluation: History of paroxysmal A-fib, diabetes, hypertension, remote history of seizures, noncompliant on multiple medications, currently not taking diabetes medication, currently not on anticoagulation, although this was discussed with her cardiologist. Social Determinants of Health: Medication noncompliance, active alcohol, tobacco use  Additional history obtained from patient's daughter. External records from outside source obtained and reviewed including Previous ED, urgent care visits reviewed including for similar  complaint of dizziness around 1 year ago.Marland Kitchen  Physical Exam: Physical exam performed. The pertinent findings include: Forehead swelling, and very minor laceration, only requiring Dermabond, no evidence of retained foreign body.  Bleeding controlled prior to my evaluation  Lab Tests: I Ordered, and personally interpreted labs.  The pertinent results include: Overall unremarkable labs, patient is hyperglycemic with blood glucose of 466, she has a pseudohyponatremia with sodium of 131, however this does represent normal value in context of elevated blood glucose.  She is a very mild hypokalemia, potassium 3.4.  Her CBC is unremarkable.  She is not pregnant at this time.   Imaging Studies: I ordered imaging studies including CT head, CT C-spine, CT maxillofacial, plain film radiographs of the right hip, right knee, lumbar spine. I independently visualized and interpreted imaging which showed fractures, chronic osteoarthritis changes, incidental 1.8 cm right thyroid nodule. I agree with the radiologist interpretation.   Cardiac Monitoring:  The patient was maintained on a cardiac monitor.  My attending physician Dr. Tomi Bamberger viewed and interpreted the cardiac monitored which showed an underlying rhythm of: Some right atrial enlargement, borderline prolonged QT.  No significant change since last tracing.   Medications: I ordered medication including improvement in Tylenol for pain, fluid bolus for hyperglycemia, presumed dehydration. Reevaluation of the patient after these medicines showed that the patient improved. I have reviewed the patients home medicines and have made adjustments as needed.   Disposition: After consideration of the diagnostic results and the patients response to treatment, I feel that patient is stable for discharge home at this time.  Is unclear what caused her syncope, however patient reports recent upper respiratory-like illness, decreased fluid intake, as well as she does have some  abnormalities in her blood glucose at this time.  Is likely that her syncope may be related to these changes.  She does have remote history of seizures, although she is not showing any postictal changes, or evidence of seizure activity during our evaluation, discussed I recommend that she continue to monitor for the symptoms, and follow-up with her PCP.  Considered admission for monitoring given history of A-fib, seizures, and new syncope and collapse, however patient appears stable, no significant abnormalities noted, no chest pain, shortness of breath, or other evidence of cardiogenic syncope at this time.  Patient does have close follow-up, encouraged her to follow-up with her PCP, cardiologist, and begin taking her medications.  Encourage ibuprofen, Tylenol, muscle relaxant for pain control.  Patient understands and agrees to this plan, discharged in stable condition at this time.  Final Clinical Impression(s) / ED Diagnoses  Final diagnoses:  Fall, initial encounter  Injury of head, initial encounter    Rx / DC Orders ED Discharge Orders     None         Dorien Chihuahua 10/02/21 1917    Dorie Rank, MD 10/04/21 0003

## 2021-10-02 NOTE — ED Notes (Signed)
Patient transported to CT 

## 2021-10-02 NOTE — ED Provider Triage Note (Signed)
Emergency Medicine Provider Triage Evaluation Note  Anna Golden , a 61 y.o. female  was evaluated in triage.  Pt complains of syncopal episode this morning. Patient reports feeling headed for about 30 minutes before falling and hitting her head on an ironing board.  She has minimal recollection of the event.  She also complaining of right hip pain after the fall.  She reports having fallen a couple times in the past month.  EMS states that her blood pressure and blood sugar have been elevated, she has not taken her morning meds. She is not on blood thinners.  Review of Systems  Positive: Syncope, lightheadedness, forehead laceration Negative: Headache, vision changes  Physical Exam  BP (!) 143/92 (BP Location: Right Arm)    Pulse 92    Temp 98.3 F (36.8 C) (Oral)    Resp 17    LMP 12/20/2014    SpO2 97%  Gen:   Awake, no distress   Resp:  Normal effort  MSK:   Moves extremities without difficulty  Other:    Medical Decision Making  Medically screening exam initiated at 11:50 AM.  Appropriate orders placed.  TAKERA RAYL was informed that the remainder of the evaluation will be completed by another provider, this initial triage assessment does not replace that evaluation, and the importance of remaining in the ED until their evaluation is complete.     Dama Hedgepeth T, PA-C 10/02/21 1157

## 2021-10-02 NOTE — Discharge Instructions (Addendum)
Please use Tylenol or ibuprofen for pain.  You may use 600 mg ibuprofen every 6 hours or 1000 mg of Tylenol every 6 hours.  You may choose to alternate between the 2.  This would be most effective.  Not to exceed 4 g of Tylenol within 24 hours.  Not to exceed 3200 mg ibuprofen 24 hours.  Additionally, I recommend that you restart your diabetes medication, at the very least continue to take your metformin, as well as you had in the past been on Farxiga.  I recommend that you check your blood sugar at least 1-2 times daily if not more.  I recommend that you follow-up more closely with your primary care doctor to ensure that your blood sugar remains in stable ranges.  Additionally I would follow-up at your earliest convenience with your cardiologist to discuss whether they want to put you on a cardiac event monitor given the few recent episodes of feeling lightheaded, and collapsing today.  It may be that you are having more episodes of the atrial fibrillation that you have had in the past, and they may want to discuss putting you back on a blood thinner if this is the case.  There is no evidence that you are in atrial fibrillation during your evaluation today.  Additionally they noted a 1.8 cm thyroid nodule on the right.  As we discussed, I do not believe that this has any bearing on your current medical condition, however they do recommend that you have a follow-up ultrasound for further evaluation of this nodule.  Please follow-up with your primary care provider for this evaluation.

## 2021-10-03 ENCOUNTER — Other Ambulatory Visit: Payer: Self-pay

## 2021-10-10 ENCOUNTER — Other Ambulatory Visit: Payer: Self-pay

## 2021-10-20 ENCOUNTER — Other Ambulatory Visit: Payer: Self-pay

## 2021-10-20 MED FILL — Atorvastatin Calcium Tab 40 MG (Base Equivalent): ORAL | 30 days supply | Qty: 30 | Fill #0 | Status: AC

## 2021-10-20 MED FILL — Diltiazem HCl Coated Beads Cap ER 24HR 120 MG: ORAL | 30 days supply | Qty: 30 | Fill #0 | Status: AC

## 2021-10-20 MED FILL — Metformin HCl Tab 1000 MG: ORAL | 30 days supply | Qty: 60 | Fill #0 | Status: AC

## 2021-10-27 ENCOUNTER — Ambulatory Visit: Payer: Self-pay

## 2021-11-03 ENCOUNTER — Telehealth: Payer: Self-pay | Admitting: Critical Care Medicine

## 2021-11-03 NOTE — Telephone Encounter (Signed)
Copied from [place] [id number]. Topic: General - Other ?>> Nov 03, 2021 12:19 PM Antonieta Iba C wrote: ?Reason for CRM: pt called in for assistance. Pt says that she missed her appt with Clifton James and would like to reschedule. Pt says that she also has an apt with PCP on 3/14. Pt would like to be advised/assisted further.  ? ? ?CB: [phone] ?

## 2021-11-06 NOTE — Progress Notes (Incomplete)
Established Patient Office Visit  Subjective:  Patient ID: Anna Golden, female    DOB: 1961/04/22  Age: 61 y.o. MRN: [id number]  CC: No chief complaint on file.   HPI Anna Golden presents for  A1c mammo tdap flu foot urine alb, colon Past Medical History:  Diagnosis Date   Acute asthma exacerbation 03/07/2014   Arthritis    Asthma    DKA (diabetic ketoacidoses) 03/07/2014   DM (diabetes mellitus) ([place]) 04/24/2013   Dysmenorrhea 01/23/2013   Fatty liver disease, nonalcoholic    Hypertension    Leiomyoma of uterus, unspecified 01/23/2013   Leukocytosis    Menorrhagia with regular cycle 01/23/2013   Obesity    PAF (paroxysmal atrial fibrillation) ([place])    a. Dx 03/2013, spont converted to NSR.   Sickle cell trait ([place])    Smoking 03/18/2014    Past Surgical History:  Procedure Laterality Date   HERNIA REPAIR      Family History  Problem Relation Age of Onset   Heart disease Mother    Diabetes Mother    Sickle cell anemia Mother    Cancer Father        colon    Social History   Socioeconomic History   Marital status: Legally Separated    Spouse name: Not on file   Number of children: Not on file   Years of education: Not on file   Highest education level: Not on file  Occupational History   Not on file  Tobacco Use   Smoking status: Every Day    Packs/day: 0.25    Types: Cigarettes   Smokeless tobacco: Never  Vaping Use   Vaping Use: Never used  Substance and Sexual Activity   Alcohol use: No   Drug use: No   Sexual activity: Yes  Other Topics Concern   Not on file  Social History Narrative   Not on file   Social Determinants of Health   Financial Resource Strain: Not on file  Food Insecurity: Not on file  Transportation Needs: Not on file  Physical Activity: Not on file  Stress: Not on file  Social Connections: Not on file  Intimate Partner Violence: Not on file    Outpatient Medications Prior to Visit   Medication Sig Dispense Refill   acetaminophen (TYLENOL) 650 MG CR tablet Take 650 mg by mouth every 8 (eight) hours as needed for pain.     aspirin EC 81 MG tablet Take 1 tablet (81 mg total) by mouth daily. Swallow whole. 100 tablet 1   atorvastatin (LIPITOR) 40 MG tablet TAKE 1 TABLET (40 MG TOTAL) BY MOUTH DAILY. 90 tablet 1   Blood Glucose Monitoring Suppl (TRUE METRIX METER) w/Device KIT Use to measure blood sugar twice a day 1 kit 0   dapagliflozin propanediol (FARXIGA) 10 MG TABS tablet Take 1 tablet (10 mg total) by mouth daily before breakfast. 30 tablet 1   diltiazem (CARDIZEM CD) 120 MG 24 hr capsule TAKE 1 CAPSULE (120 MG TOTAL) BY MOUTH DAILY. 90 capsule 1   diltiazem (CARDIZEM CD) 120 MG 24 hr capsule TAKE 1 CAPSULE BY MOUTH DAILY. 30 capsule 0   glucose blood (TRUE METRIX BLOOD GLUCOSE TEST) test strip Use as instructed 100 each 12   ibuprofen (ADVIL) 800 MG tablet Take 1 tablet (800 mg total) by mouth 3 (three) times daily. 21 tablet 0   Liniments (PAIN RELIEF EX) Apply 1 application topically 4 (four) times daily as needed (pain). (Patient  not taking: Reported on 11/02/2020)     metFORMIN (GLUCOPHAGE) 1000 MG tablet TAKE 1 TABLET (1,000 MG TOTAL) BY MOUTH 2 (TWO) TIMES DAILY WITH A MEAL. 120 tablet 3   methocarbamol (ROBAXIN) 500 MG tablet Take 1 tablet (500 mg total) by mouth 2 (two) times daily. 20 tablet 0   No facility-administered medications prior to visit.    Allergies  Allergen Reactions   Metoprolol Other (See Comments)    "Patient slept for whole day after starting medication this week.  Patient does not want to take this med.  Not sure if med causes hypotension, but could not get out of bed for any reason"   Other Other (See Comments)    ANESTHESIA-CAUSES [place], 2008.     Latex Itching    Swell up    ROS Review of Systems    Objective:    Physical Exam  LMP 12/20/2014  Wt Readings from Last 3  Encounters:  10/02/21 174 lb 2.6 oz (79 kg)  08/01/20 173 lb (78.5 kg)  06/09/20 182 lb 1.6 oz (82.6 kg)     Health Maintenance Due  Topic Date Due   OPHTHALMOLOGY EXAM  Never done   TETANUS/TDAP  Never done   MAMMOGRAM  Never done   Zoster Vaccines- Shingrix (1 of 2) Never done   PAP SMEAR-Modifier  01/24/2016   COVID-19 Vaccine (3 - Booster for Moderna series) 07/16/2020   HEMOGLOBIN A1C  01/30/2021   INFLUENZA VACCINE  03/27/2021   FOOT EXAM  08/01/2021   URINE MICROALBUMIN  08/01/2021   COLON CANCER SCREENING ANNUAL FOBT  08/01/2021    There are no preventive care reminders to display for this patient.  Lab Results  Component Value Date   TSH 1.267 04/08/2014   Lab Results  Component Value Date   WBC 9.9 10/02/2021   HGB 13.2 10/02/2021   HCT 37.4 10/02/2021   MCV 82.9 10/02/2021   PLT 237 10/02/2021   Lab Results  Component Value Date   NA 131 (L) 10/02/2021   K 3.4 (L) 10/02/2021   CO2 22 10/02/2021   GLUCOSE 466 (H) 10/02/2021   BUN 9 10/02/2021   CREATININE 0.78 10/02/2021   BILITOT 0.4 08/30/2020   ALKPHOS 38 08/30/2020   AST 16 08/30/2020   ALT 15 08/30/2020   PROT 7.1 08/30/2020   ALBUMIN 3.6 08/30/2020   CALCIUM 8.8 (L) 10/02/2021   ANIONGAP 11 10/02/2021   Lab Results  Component Value Date   CHOL 136 04/07/2019   Lab Results  Component Value Date   HDL 48 04/07/2019   Lab Results  Component Value Date   LDLCALC 62 04/07/2019   Lab Results  Component Value Date   TRIG 129 04/07/2019   Lab Results  Component Value Date   CHOLHDL 2.8 04/07/2019   Lab Results  Component Value Date   HGBA1C 9.0 (A) 08/01/2020      Assessment & Plan:   Problem List Items Addressed This Visit   None   No orders of the defined types were placed in this encounter.   Follow-up: No follow-ups on file.    Asencion Noble, MD

## 2021-11-07 ENCOUNTER — Other Ambulatory Visit: Payer: Self-pay

## 2021-11-07 ENCOUNTER — Other Ambulatory Visit: Payer: Self-pay | Admitting: Pharmacist

## 2021-11-07 ENCOUNTER — Ambulatory Visit: Payer: Self-pay | Attending: Critical Care Medicine | Admitting: Critical Care Medicine

## 2021-11-07 ENCOUNTER — Encounter: Payer: Self-pay | Admitting: Critical Care Medicine

## 2021-11-07 VITALS — BP 133/68 | HR 88 | Wt 164.8 lb

## 2021-11-07 DIAGNOSIS — E1142 Type 2 diabetes mellitus with diabetic polyneuropathy: Secondary | ICD-10-CM

## 2021-11-07 DIAGNOSIS — M25531 Pain in right wrist: Secondary | ICD-10-CM

## 2021-11-07 DIAGNOSIS — I480 Paroxysmal atrial fibrillation: Secondary | ICD-10-CM

## 2021-11-07 DIAGNOSIS — M25532 Pain in left wrist: Secondary | ICD-10-CM

## 2021-11-07 DIAGNOSIS — F17200 Nicotine dependence, unspecified, uncomplicated: Secondary | ICD-10-CM

## 2021-11-07 DIAGNOSIS — M1990 Unspecified osteoarthritis, unspecified site: Secondary | ICD-10-CM

## 2021-11-07 DIAGNOSIS — M6530 Trigger finger, unspecified finger: Secondary | ICD-10-CM

## 2021-11-07 DIAGNOSIS — R296 Repeated falls: Secondary | ICD-10-CM

## 2021-11-07 DIAGNOSIS — R1011 Right upper quadrant pain: Secondary | ICD-10-CM

## 2021-11-07 DIAGNOSIS — Z1211 Encounter for screening for malignant neoplasm of colon: Secondary | ICD-10-CM

## 2021-11-07 DIAGNOSIS — E1165 Type 2 diabetes mellitus with hyperglycemia: Secondary | ICD-10-CM

## 2021-11-07 DIAGNOSIS — IMO0001 Reserved for inherently not codable concepts without codable children: Secondary | ICD-10-CM

## 2021-11-07 DIAGNOSIS — Z1231 Encounter for screening mammogram for malignant neoplasm of breast: Secondary | ICD-10-CM

## 2021-11-07 DIAGNOSIS — E010 Iodine-deficiency related diffuse (endemic) goiter: Secondary | ICD-10-CM

## 2021-11-07 DIAGNOSIS — E782 Mixed hyperlipidemia: Secondary | ICD-10-CM

## 2021-11-07 DIAGNOSIS — M25559 Pain in unspecified hip: Secondary | ICD-10-CM | POA: Insufficient documentation

## 2021-11-07 DIAGNOSIS — I10 Essential (primary) hypertension: Secondary | ICD-10-CM

## 2021-11-07 MED ORDER — METFORMIN HCL 1000 MG PO TABS
ORAL_TABLET | Freq: Two times a day (BID) | ORAL | 3 refills | Status: DC
  Filled 2021-11-07: qty 120, fill #0

## 2021-11-07 MED ORDER — ATORVASTATIN CALCIUM 40 MG PO TABS
ORAL_TABLET | Freq: Every day | ORAL | 1 refills | Status: DC
  Filled 2021-11-07: qty 90, fill #0
  Filled 2022-01-19: qty 30, 30d supply, fill #0
  Filled 2022-05-15 – 2022-05-31 (×2): qty 30, 30d supply, fill #1

## 2021-11-07 MED ORDER — DILTIAZEM HCL ER COATED BEADS 120 MG PO CP24
ORAL_CAPSULE | Freq: Every day | ORAL | 2 refills | Status: DC
  Filled 2021-11-07: qty 60, fill #0
  Filled 2022-01-19: qty 30, 30d supply, fill #0
  Filled 2022-05-15 – 2022-05-31 (×2): qty 30, 30d supply, fill #1

## 2021-11-07 MED ORDER — TRUEPLUS 5-BEVEL PEN NEEDLES 32G X 4 MM MISC
2 refills | Status: DC
  Filled 2021-11-07: qty 100, 50d supply, fill #0

## 2021-11-07 MED ORDER — BASAGLAR KWIKPEN 100 UNIT/ML ~~LOC~~ SOPN
35.0000 [IU] | PEN_INJECTOR | Freq: Every day | SUBCUTANEOUS | 1 refills | Status: DC
Start: 2021-11-07 — End: 2021-12-12
  Filled 2021-11-07: qty 12, 34d supply, fill #0

## 2021-11-07 NOTE — Assessment & Plan Note (Signed)
Assess with metabolic panel and ultrasound of abdomen ?

## 2021-11-07 NOTE — Assessment & Plan Note (Signed)
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 ?I offered physical therapy the patient declined ? ?Improved control of diabetes essential ? ?Patient was finally consented and agreed to begin insulin we will begin insulin glargine 35 units daily.  We gave her very small needles.  She will resume metformin at 1000 mg twice daily ? ?We will obtain urine for microalbumin ? ?We will obtain hemoglobin A1c with a blood draw ? ?Will refer to endocrinology ?

## 2021-11-07 NOTE — Assessment & Plan Note (Signed)
? ? ??   Current smoking consumption amount: 10 cigarettes daily ? ?? Dicsussion on advise to quit smoking and smoking impacts: Cardiovascular health impacts ? ?? Patient's willingness to quit: Willing to quit ? ?? Methods to quit smoking discussed: Behavioral modification nicotine replacement ? ?? Medication management of smoking session drugs discussed: Nicotine patches ? ?? Resources provided:  AVS  ? ?? Setting quit date not established ? ?? Follow-up arranged 1 month ? ? ?Time spent counseling the patient: 5 minutes ? ?

## 2021-11-07 NOTE — Patient Instructions (Addendum)
Reduce tobacco intake see attachment ? ?Follow healthy lifestyle management with improved diet see handout we gave you today ? ?Begin insulin glargine 35 units daily ? ?Resume metformin twice daily ? ?Resume atorvastatin daily for cholesterol ? ?Referral to orthopedics for wrist hip and knee pain was sent ? ?Ultrasound of thyroid and abdomen will be obtained ? ?Complete screening labs obtained at this visit including a urine to check for protein in the urine ? ?Keep yourself well-hydrated with water ? ?Return to see Lurena Joiner our clinical pharmacist in 3 weeks Dr. Joya Gaskins in 6 weeks ? ?A mammogram needs to be obtained we will use the patient's scholarship program for this ? ?Return to see one of our providers for a Pap smear in the next 2 to 3 months ? ?Insurance card application filled and reassessed ?

## 2021-11-07 NOTE — Assessment & Plan Note (Signed)
Blood pressure well controlled at this time continue diltiazem which is controlling her rate and blood pressure ?

## 2021-11-07 NOTE — Telephone Encounter (Signed)
I return Pt call, and schedule a financial appt ?

## 2021-11-07 NOTE — Assessment & Plan Note (Signed)
Currently in sinus rhythm this is not an active problem ?

## 2021-11-07 NOTE — Assessment & Plan Note (Signed)
Frequent falls with neuropathy needs to use cane more often ?

## 2021-11-07 NOTE — Assessment & Plan Note (Signed)
Referral to orthopedics made 

## 2021-11-07 NOTE — Assessment & Plan Note (Signed)
Thyroid is enlarged will obtain thyroid ultrasound thyroid function ?

## 2021-11-07 NOTE — Assessment & Plan Note (Signed)
Referral made to orthopedics

## 2021-11-08 ENCOUNTER — Other Ambulatory Visit: Payer: Self-pay

## 2021-11-08 LAB — BASIC METABOLIC PANEL
BUN/Creatinine Ratio: 12 (ref 9–23)
BUN: 9 mg/dL (ref 6–24)
CO2: 24 mmol/L (ref 20–29)
Calcium: 10.1 mg/dL (ref 8.7–10.2)
Chloride: 101 mmol/L (ref 96–106)
Creatinine, Ser: 0.75 mg/dL (ref 0.57–1.00)
Glucose: 338 mg/dL — ABNORMAL HIGH (ref 70–99)
Potassium: 4.1 mmol/L (ref 3.5–5.2)
Sodium: 140 mmol/L (ref 134–144)
eGFR: 92 mL/min/{1.73_m2} (ref >59)

## 2021-11-08 LAB — HEMOGLOBIN A1C
Est. average glucose Bld gHb Est-mCnc: 355 mg/dL
Hgb A1c MFr Bld: 14 % — ABNORMAL HIGH (ref 4.8–5.6)

## 2021-11-08 LAB — THYROID PANEL WITH TSH
Free Thyroxine Index: 2.6 (ref 1.2–4.9)
T3 Uptake Ratio: 28 % (ref 24–39)
T4, Total: 9.2 ug/dL (ref 4.5–12.0)
TSH: 1.64 u[IU]/mL (ref 0.450–4.500)

## 2021-11-08 LAB — LIPID PANEL
Chol/HDL Ratio: 2.7 ratio (ref 0.0–4.4)
Cholesterol, Total: 159 mg/dL (ref 100–199)
HDL: 59 mg/dL (ref >39)
LDL Chol Calc (NIH): 77 mg/dL (ref 0–99)
Triglycerides: 129 mg/dL (ref 0–149)
VLDL Cholesterol Cal: 23 mg/dL (ref 5–40)

## 2021-11-09 ENCOUNTER — Telehealth: Payer: Self-pay

## 2021-11-09 NOTE — Telephone Encounter (Signed)
-----   Message from Elsie Stain, MD sent at 11/08/2021  4:25 PM EDT ----- ?Let pt know A1C 14 , not surprising given how high blood sugar remains.  Cholesterol is at goal, thyroid is normal kidney normal ? ?Take medications as prescribed at [facility] ?

## 2021-11-09 NOTE — Telephone Encounter (Signed)
Pt was called and vm was left, Information has been sent to nurse pool.   

## 2021-11-13 ENCOUNTER — Ambulatory Visit: Payer: Self-pay | Attending: Critical Care Medicine

## 2021-11-13 ENCOUNTER — Other Ambulatory Visit: Payer: Self-pay

## 2021-11-30 ENCOUNTER — Ambulatory Visit: Payer: Self-pay | Admitting: Pharmacist

## 2021-12-05 ENCOUNTER — Other Ambulatory Visit (HOSPITAL_BASED_OUTPATIENT_CLINIC_OR_DEPARTMENT_OTHER): Payer: Self-pay | Admitting: Orthopaedic Surgery

## 2021-12-05 DIAGNOSIS — M25532 Pain in left wrist: Secondary | ICD-10-CM

## 2021-12-06 ENCOUNTER — Ambulatory Visit (HOSPITAL_BASED_OUTPATIENT_CLINIC_OR_DEPARTMENT_OTHER): Payer: Self-pay | Admitting: Orthopaedic Surgery

## 2021-12-12 ENCOUNTER — Other Ambulatory Visit: Payer: Self-pay

## 2021-12-12 ENCOUNTER — Ambulatory Visit: Payer: Self-pay | Attending: Critical Care Medicine | Admitting: Pharmacist

## 2021-12-12 ENCOUNTER — Encounter: Payer: Self-pay | Admitting: Pharmacist

## 2021-12-12 DIAGNOSIS — E1142 Type 2 diabetes mellitus with diabetic polyneuropathy: Secondary | ICD-10-CM

## 2021-12-12 DIAGNOSIS — E1165 Type 2 diabetes mellitus with hyperglycemia: Secondary | ICD-10-CM

## 2021-12-12 MED ORDER — METFORMIN HCL ER 500 MG PO TB24
1000.0000 mg | ORAL_TABLET | Freq: Two times a day (BID) | ORAL | 2 refills | Status: DC
  Filled 2021-12-12 – 2022-01-19 (×2): qty 120, 30d supply, fill #0
  Filled 2022-05-15 – 2022-05-31 (×2): qty 120, 30d supply, fill #1

## 2021-12-12 MED ORDER — BASAGLAR KWIKPEN 100 UNIT/ML ~~LOC~~ SOPN
45.0000 [IU] | PEN_INJECTOR | Freq: Every day | SUBCUTANEOUS | 2 refills | Status: DC
  Filled 2021-12-12: qty 15, 33d supply, fill #0
  Filled 2022-01-19: qty 15, 34d supply, fill #0
  Filled 2022-05-15 – 2022-05-31 (×2): qty 15, 34d supply, fill #1

## 2021-12-12 NOTE — Progress Notes (Signed)
? ? ?  S:    ? ?No chief complaint on file. ? ?Anna Golden is a 61 y.o. female who presents for diabetes evaluation, education, and management. PMH is significant for T2DM, HTN, obesity, tobacco use, HLD. Patient was referred and last seen by Primary Care Provider, Dr. Joya Gaskins, on 11/07/2021.  ? ?Today, patient arrives in good spirits and presents without assistance. . ? ?Patient reports Diabetes is longstanding. Her A1c at her PCP visit a month ago was found to be 14%. She was started on insulin. Today, she denies any hx of ACS, stroke, CHF, or CKD. Denies any hx of pancreatitis, no thyroid cancer.   ? ?Family/Social History:  ?Fhx: heart disease, DM ?Tobacco: current 0.25 PPD smoker  ?Alcohol: none reported  ? ?Partial medication adherence reported.  ?Current diabetes medications include: Basaglar 35u daily, metformin 1000 mg BID (stopped after starting insulin - was unsure if she should continue this) ? ?Insurance coverage: none, self-pay ? ?Patient denies hypoglycemic events. ? ?Reported home fasting blood sugars: 300-400s despite starting insulin   ?Reported 2 hour post-meal/random blood sugars: none. ? ?Patient denies nocturia (nighttime urination).  ?Patient reports improvement neuropathy (nerve pain). ?Patient denies visual changes. ?Patient reports self foot exams.  ? ?Patient reported dietary habits:  ?-Has increased her intake of vegetables and baked proteins (unbreaded) ?-Admits to still drinking Pepsi ? ?Patient-reported exercise habits:  ?-None. Will walk around her apartment complex 3 days a week. ? ? ?O: ? ?Lab Results  ?Component Value Date  ? HGBA1C 14.0 (H) 11/07/2021  ? ?There were no vitals filed for this visit. ? ?Lipid Panel  ?   ?Component Value Date/Time  ? CHOL 159 11/07/2021 1206  ? TRIG 129 11/07/2021 1206  ? HDL 59 11/07/2021 1206  ? CHOLHDL 2.7 11/07/2021 1206  ? CHOLHDL 2.5 04/08/2014 1059  ? VLDL 18 04/08/2014 1059  ? [place] 77 11/07/2021 1206  ? ? ?Clinical Atherosclerotic  Cardiovascular Disease (ASCVD): No  ?The 10-year ASCVD risk score (Arnett DK, et al., 2019) is: 22.6% ?  Values used to calculate the score: ?    Age: 3 years ?    Sex: Female ?    Is Non-Hispanic African American: Yes ?    Diabetic: Yes ?    Tobacco smoker: Yes ?    Systolic Blood Pressure: 354 mmHg ?    Is BP treated: Yes ?    HDL Cholesterol: 59 mg/dL ?    Total Cholesterol: 159 mg/dL  ? ? ?A/P: ?Diabetes longstanding currently uncontrolled. Patient is able to verbalize appropriate hypoglycemia management plan. Medication adherence appears suboptimal - will have her resume metformin and increase Basaglar dose. ?-Increased dose of Basaglar to 45u daily.  ?- Changed  metformin to metformin XR 500 mg. Pt instructed to take 2 tablets ('1000mg'$ ) BID.   ?-Patient educated on purpose, proper use, and potential adverse effects of metformin XR.  ?-Extensively discussed pathophysiology of diabetes, recommended lifestyle interventions, dietary effects on blood sugar control.  ?-Counseled on s/sx of and management of hypoglycemia.  ?-Next A1c anticipated 01/2022.  ? ?Written patient instructions provided. Patient verbalized understanding of treatment plan. Total time in face to face counseling 30 minutes.   ? ?Follow up pharmacist clinic visit in 3-4 weeks. ? ?Benard Halsted, PharmD, BCACP, CPP ?Clinical Pharmacist ?[facility] ?[phone] ? ? ?

## 2021-12-13 ENCOUNTER — Other Ambulatory Visit: Payer: Self-pay

## 2021-12-19 ENCOUNTER — Other Ambulatory Visit: Payer: Self-pay

## 2022-01-05 ENCOUNTER — Ambulatory Visit: Payer: Self-pay | Admitting: Pharmacist

## 2022-01-14 NOTE — Progress Notes (Deleted)
Established Patient Office Visit  Subjective:  Patient ID: Anna Golden, female    DOB: [birth year]  Age: 61 y.o. MRN: [id number]  CC: Primary care to reestablish  HPI 11/07/21 [name] presents for primary care to reestablish with prior history of hypertension and type 2 diabetes.  This patient has difficulty with using needles for insulin and been trying to manage her orally.  We tried on Farxiga but she did not tolerate this well.  She is only been taking metformin and she misses many doses.  Have not seen her since March 2022.  She is due a variety of primary care screenings  On arrival blood sugar 326 blood pressure 133/68.  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  She does not follow a healthy diet.  She fell February 6 because of weakness and was found to be volume depleted.  She was given a liter of saline had a series of x-rays which were negative.  She still has some soreness in the right hip and in the shoulders.  As stated she cannot tolerate Wilder Glade because of side effects.  She is complaining of right upper quadrant abdominal pain and thyromegaly.  She is still smoking a pack every 2 days of cigarettes.  She does not drink alcohol.  Patient also complains of bilateral wrist pain and weakness in the hands.  She also has frequent falls at home as well.  She states she has difficulty walking because of neuropathy in the feet.  The patient has a cane at home but she rarely uses this.  Patient's had physical therapy when she lived in [place] but she declines to receive this again.  She does not have any insurance she is working on [employer].  01/14/22 Tdap eye pap foot fobt urine alb mammo  Hypertension (Chronic)    Blood pressure well controlled at this time continue diltiazem which is controlling her rate and blood pressure      Relevant Medications   atorvastatin (LIPITOR) 40 MG tablet   diltiazem (CARDIZEM CD) 120 MG 24 hr capsule    Other Relevant Orders   Thyroid Panel With TSH   RESOLVED: PAF (paroxysmal atrial fibrillation) (HCC)    Currently in sinus rhythm this is not an active problem      Relevant Medications   atorvastatin (LIPITOR) 40 MG tablet   diltiazem (CARDIZEM CD) 120 MG 24 hr capsule     Endocrine   Poorly controlled type 2 diabetes mellitus with peripheral neuropathy ([place]) - Primary    Poorly controlled type 2 diabetes with severe peripheral neuropathy resulting in weakness in the hands trigger finger frequent falls balance issues  I offered physical therapy the patient declined  Improved control of diabetes essential  Patient was finally consented and agreed to begin insulin we will begin insulin glargine 35 units daily.  We gave her very small needles.  She will resume metformin at 1000 mg twice daily  We will obtain urine for microalbumin  We will obtain hemoglobin A1c with a blood draw  Will refer to endocrinology      Relevant Medications   atorvastatin (LIPITOR) 40 MG tablet   Insulin Glargine (BASAGLAR KWIKPEN) 100 UNIT/ML   metFORMIN (GLUCOPHAGE) 1000 MG tablet   Other Relevant Orders   Microalbumin / creatinine urine ratio   Basic Metabolic Panel   Lipid panel   Hemoglobin A1c   Ambulatory referral to Endocrinology   Thyromegaly  Thyroid is enlarged will obtain thyroid ultrasound thyroid function      Relevant Orders   US THYROID     Musculoskeletal and Integument   Arthritis    Referral made to orthopedics      Trigger finger    Referral made to orthopedics      Relevant Orders   Ambulatory referral to Orthopedic Surgery     Other   Smoking       Current smoking consumption amount: 10 cigarettes daily  Dicsussion on advise to quit smoking and smoking impacts: Cardiovascular health impacts  Patient's willingness to quit: Willing to quit  Methods to quit smoking discussed: Behavioral modification nicotine replacement  Medication management of smoking  session drugs discussed: Nicotine patches  Resources provided:  AVS   Setting quit date not established  Follow-up arranged 1 month   Time spent counseling the patient: 5 minutes       Mixed hyperlipidemia   Relevant Medications   atorvastatin (LIPITOR) 40 MG tablet   diltiazem (CARDIZEM CD) 120 MG 24 hr capsule   Pain in both wrists   Relevant Orders   Ambulatory referral to Orthopedic Surgery   Hip pain    Referral to orthopedics made      Relevant Orders   Ambulatory referral to Orthopedic Surgery   RUQ abdominal pain    Assess with metabolic panel and ultrasound of abdomen      Relevant Orders   US Abdomen Complete   Frequent falls    Frequent falls with neuropathy needs to use cane more often      Other Visit Diagnoses     Colon cancer screening       Relevant Orders   Fecal occult blood, imunochemical   Encounter for screening mammogram for malignant neoplasm of breast       Relevant Orders   MM DIGITAL SCREENING BILATERAL   Past Medical History:  Diagnosis Date   Acute asthma exacerbation 03/07/2014   Arthritis    Asthma    DKA (diabetic ketoacidoses) 03/07/2014   DM (diabetes mellitus) ([place]) 04/24/2013   Dysmenorrhea 01/23/2013   Fatty liver disease, nonalcoholic    Hypertension    Leiomyoma of uterus, unspecified 01/23/2013   Leukocytosis    Menorrhagia with regular cycle 01/23/2013   Obesity    PAF (paroxysmal atrial fibrillation) ([place])    a. Dx 03/2013, spont converted to NSR.   Sickle cell trait ([place])    Smoking 03/18/2014    Past Surgical History:  Procedure Laterality Date   HERNIA REPAIR      Family History  Problem Relation Age of Onset   Heart disease Mother    Diabetes Mother    Sickle cell anemia Mother    Cancer Father        colon    Social History   Socioeconomic History   Marital status: Legally Separated    Spouse name: Not on file   Number of children: Not on file   Years of education: Not on file   Highest  education level: Not on file  Occupational History   Not on file  Tobacco Use   Smoking status: Every Day    Packs/day: 0.25    Types: Cigarettes   Smokeless tobacco: Never  Vaping Use   Vaping Use: Never used  Substance and Sexual Activity   Alcohol use: No   Drug use: No   Sexual activity: Yes  Other Topics Concern   Not on file  Social History Narrative   Not on file   Social Determinants of Health   Financial Resource Strain: Not on file  Food Insecurity: Not on file  Transportation Needs: Not on file  Physical Activity: Not on file  Stress: Not on file  Social Connections: Not on file  Intimate Partner Violence: Not on file    Outpatient Medications Prior to Visit  Medication Sig Dispense Refill   acetaminophen (TYLENOL) 650 MG CR tablet Take 650 mg by mouth every 8 (eight) hours as needed for pain. (Patient not taking: Reported on 11/07/2021)     aspirin EC 81 MG tablet Take 1 tablet (81 mg total) by mouth daily. Swallow whole. 100 tablet 1   atorvastatin (LIPITOR) 40 MG tablet TAKE 1 TABLET (40 MG TOTAL) BY MOUTH DAILY. 90 tablet 1   Blood Glucose Monitoring Suppl (TRUE METRIX METER) w/Device KIT Use to measure blood sugar twice a day 1 kit 0   diltiazem (CARDIZEM CD) 120 MG 24 hr capsule TAKE 1 CAPSULE BY MOUTH DAILY. 60 capsule 2   glucose blood (TRUE METRIX BLOOD GLUCOSE TEST) test strip Use as instructed 100 each 12   ibuprofen (ADVIL) 800 MG tablet Take 1 tablet (800 mg total) by mouth 3 (three) times daily. (Patient not taking: Reported on 11/07/2021) 21 tablet 0   Insulin Glargine (BASAGLAR KWIKPEN) 100 UNIT/ML Inject 45 Units into the skin daily. 15 mL 2   Insulin Pen Needle (TRUEPLUS 5-BEVEL PEN NEEDLES) 32G X 4 MM MISC Use to inject insulin once daily. 100 each 2   Liniments (PAIN RELIEF EX) Apply 1 application topically 4 (four) times daily as needed (pain). (Patient not taking: Reported on 11/02/2020)     metFORMIN (GLUCOPHAGE-XR) 500 MG 24 hr tablet Take 2  tablets (1,000 mg total) by mouth 2 (two) times daily. 120 tablet 2   No facility-administered medications prior to visit.    Allergies  Allergen Reactions   Metoprolol Other (See Comments)    "Patient slept for whole day after starting medication this week.  Patient does not want to take this med.  Not sure if med causes hypotension, but could not get out of bed for any reason"   Other Other (See Comments)    ANESTHESIA-CAUSES [place], 2008.     Latex Itching    Swell up    ROS Review of Systems  Constitutional:  Positive for fatigue.  HENT: Negative.  Negative for ear pain, postnasal drip, rhinorrhea, sinus pressure, sore throat, trouble swallowing and voice change.   Eyes: Negative.   Respiratory: Negative.  Negative for apnea, cough, choking, chest tightness, shortness of breath, wheezing and stridor.   Cardiovascular: Negative.  Negative for chest pain, palpitations and leg swelling.  Gastrointestinal:  Positive for abdominal distention and abdominal pain. Negative for anal bleeding, blood in stool, constipation, diarrhea, nausea and vomiting.  Endocrine: Positive for polydipsia, polyphagia and polyuria.  Genitourinary:  Positive for enuresis.  Musculoskeletal:  Positive for gait problem. Negative for arthralgias and myalgias.  Skin: Negative.  Negative for rash.  Allergic/Immunologic: Negative.  Negative for environmental allergies and food allergies.  Neurological:  Positive for syncope, weakness and numbness. Negative for dizziness, tremors, seizures, facial asymmetry, speech difficulty, light-headedness and headaches.  Hematological: Negative.  Negative for adenopathy. Does not bruise/bleed easily.  Psychiatric/Behavioral: Negative.  Negative for agitation and sleep disturbance. The patient is not nervous/anxious.      Objective:    Physical Exam Vitals reviewed.  Constitutional:      Appearance: Normal appearance. She is  well-developed. She is obese. She is not diaphoretic.  HENT:     Head: Normocephalic and atraumatic.     Nose: No nasal deformity, septal deviation, mucosal edema or rhinorrhea.     Right Sinus: No maxillary sinus tenderness or frontal sinus tenderness.     Left Sinus: No maxillary sinus tenderness or frontal sinus tenderness.     Mouth/Throat:     Pharynx: No oropharyngeal exudate.  Eyes:     General: No scleral icterus.    Conjunctiva/sclera: Conjunctivae normal.     Pupils: Pupils are equal, round, and reactive to light.  Neck:     Thyroid: Thyromegaly present.     Vascular: No carotid bruit or JVD.     Trachea: Trachea normal. No tracheal tenderness or tracheal deviation.  Cardiovascular:     Rate and Rhythm: Normal rate and regular rhythm.     Chest Wall: PMI is not displaced.     Pulses: Normal pulses. No decreased pulses.     Heart sounds: Normal heart sounds, S1 normal and S2 normal. Heart sounds not distant. No murmur heard. No systolic murmur is present.  No diastolic murmur is present.    No friction rub. No gallop. No S3 or S4 sounds.  Pulmonary:     Effort: No tachypnea, accessory muscle usage or respiratory distress.     Breath sounds: No stridor. No decreased breath sounds, wheezing, rhonchi or rales.  Chest:     Chest wall: No tenderness.  Abdominal:     General: Bowel sounds are normal. There is no distension.     Palpations: Abdomen is soft. Abdomen is not rigid.     Tenderness: There is no abdominal tenderness. There is no guarding or rebound.  Musculoskeletal:        General: Normal range of motion.     Cervical back: Normal range of motion and neck supple. No edema, erythema or rigidity. No muscular tenderness. Normal range of motion.  Lymphadenopathy:     Head:     Right side of head: No submental or submandibular adenopathy.     Left side of head: No submental or submandibular adenopathy.     Cervical: No cervical adenopathy.  Skin:    General: Skin is  warm and dry.     Coloration: Skin is not pale.     Findings: No rash.     Nails: There is no clubbing.  Neurological:     Mental Status: She is alert and oriented to person, place, and time.     Cranial Nerves: Cranial nerves 2-12 are intact.     Sensory: No sensory deficit.     Motor: Motor function is intact.     Coordination: Coordination abnormal.     Comments: Weakness in both hands  Psychiatric:        Speech: Speech normal.        Behavior: Behavior normal.    LMP 12/20/2014  Wt Readings from Last 3 Encounters:  11/07/21 164 lb 12.8 oz (74.8 kg)  10/02/21 174 lb 2.6 oz (79 kg)  08/01/20 173 lb (78.5 kg)     Health Maintenance Due  Topic Date Due   OPHTHALMOLOGY EXAM  Never done   TETANUS/TDAP  Never done   MAMMOGRAM  Never done   Zoster Vaccines- Shingrix (1 of 2) Never done   PAP SMEAR-Modifier  01/24/2016   COVID-19 Vaccine (3 - Booster for Moderna series)  07/16/2020   FOOT EXAM  08/01/2021   URINE MICROALBUMIN  08/01/2021   COLON CANCER SCREENING ANNUAL FOBT  08/01/2021    There are no preventive care reminders to display for this patient.  Lab Results  Component Value Date   TSH 1.640 11/07/2021   Lab Results  Component Value Date   WBC 9.9 10/02/2021   HGB 13.2 10/02/2021   HCT 37.4 10/02/2021   MCV 82.9 10/02/2021   PLT 237 10/02/2021   Lab Results  Component Value Date   NA 140 11/07/2021   K 4.1 11/07/2021   CO2 24 11/07/2021   GLUCOSE 338 (H) 11/07/2021   BUN 9 11/07/2021   CREATININE 0.75 11/07/2021   BILITOT 0.4 08/30/2020   ALKPHOS 38 08/30/2020   AST 16 08/30/2020   ALT 15 08/30/2020   PROT 7.1 08/30/2020   ALBUMIN 3.6 08/30/2020   CALCIUM 10.1 11/07/2021   ANIONGAP 11 10/02/2021   EGFR 92 11/07/2021   Lab Results  Component Value Date   CHOL 159 11/07/2021   Lab Results  Component Value Date   HDL 59 11/07/2021   Lab Results  Component Value Date   LDLCALC 77 11/07/2021   Lab Results  Component Value Date   TRIG  129 11/07/2021   Lab Results  Component Value Date   CHOLHDL 2.7 11/07/2021   Lab Results  Component Value Date   HGBA1C 14.0 (H) 11/07/2021      Assessment & Plan:   Problem List Items Addressed This Visit   None  No orders of the defined types were placed in this encounter. 38 minutes spent complex decision making is high Mammogram is ordered and fecal occult kit issued for colon cancer screening Follow-up: No follow-ups on file.    Asencion Noble, MD

## 2022-01-15 ENCOUNTER — Ambulatory Visit: Payer: Self-pay | Admitting: Critical Care Medicine

## 2022-01-19 ENCOUNTER — Other Ambulatory Visit: Payer: Self-pay

## 2022-01-25 ENCOUNTER — Other Ambulatory Visit: Payer: Self-pay

## 2022-02-12 ENCOUNTER — Emergency Department (HOSPITAL_COMMUNITY)
Admission: EM | Admit: 2022-02-12 | Discharge: 2022-02-12 | Discharge status: Against Medical Advice | Payer: Self-pay | Attending: Emergency Medicine | Admitting: Emergency Medicine

## 2022-02-12 ENCOUNTER — Other Ambulatory Visit: Payer: Self-pay

## 2022-02-12 DIAGNOSIS — K122 Cellulitis and abscess of mouth: Secondary | ICD-10-CM | POA: Insufficient documentation

## 2022-02-12 DIAGNOSIS — Z5321 Procedure and treatment not carried out due to patient leaving prior to being seen by health care provider: Secondary | ICD-10-CM | POA: Insufficient documentation

## 2022-02-12 NOTE — ED Provider Triage Note (Signed)
Emergency Medicine Provider Triage Evaluation Note  Anna Golden , a 61 y.o. female  was evaluated in triage.  Pt complains of multiple abscesses in her mouth.  She has noticed swelling in her roof of her mouth which moved to her left lower jaw and now on her right lower jaw.  Reports associated discoloration of her lower gumline.  Denies fevers.  Review of Systems  Positive: Abscess Negative: Fever  Physical Exam  LMP 12/20/2014  Gen:   Awake, no distress   Resp:  Normal effort  MSK:   Moves extremities without difficulty  Other:  Swelling noted in R lower gumline with normal phonation, no signs of distress, no trismus or drooling  Medical Decision Making  Medically screening exam initiated at 5:46 PM.  Appropriate orders placed.  Anna Golden was informed that the remainder of the evaluation will be completed by another provider, this initial triage assessment does not replace that evaluation, and the importance of remaining in the ED until their evaluation is complete.     Delia Heady, PA-C 02/12/22 1747

## 2022-02-12 NOTE — ED Triage Notes (Signed)
Pt came in via POV d/t abcess in her mouth. Treated at home with cleaning her mouth with salt water rinses with no improvement. Originally the top-Left of her mouth was sore then the bottom-Left, now its on the bottom & her mouth is looking red (per pt). There is an abscess seen to the inner right area below her teeth that is white to its surface & pt states it has her entire lower jaw sore & Rt side of her neck. Her mouth is too sore to chew food, denies any fevers or recent oral injuries. Pt also states that she feels like she may have woke up in the night after having a seizure (per pt), has Hx of seizures but does not take anything for them .

## 2022-02-12 NOTE — ED Notes (Signed)
Pt left AMA °

## 2022-03-14 ENCOUNTER — Emergency Department (HOSPITAL_COMMUNITY)
Admission: EM | Admit: 2022-03-14 | Discharge: 2022-03-14 | Disposition: A | Discharge status: Home / Self Care | Payer: Self-pay | Attending: Emergency Medicine | Admitting: Emergency Medicine

## 2022-03-14 ENCOUNTER — Emergency Department (HOSPITAL_COMMUNITY): Payer: Self-pay

## 2022-03-14 ENCOUNTER — Encounter (HOSPITAL_COMMUNITY): Payer: Self-pay

## 2022-03-14 ENCOUNTER — Other Ambulatory Visit: Payer: Self-pay

## 2022-03-14 DIAGNOSIS — Z7982 Long term (current) use of aspirin: Secondary | ICD-10-CM | POA: Insufficient documentation

## 2022-03-14 DIAGNOSIS — Z79899 Other long term (current) drug therapy: Secondary | ICD-10-CM | POA: Insufficient documentation

## 2022-03-14 DIAGNOSIS — T189XXA Foreign body of alimentary tract, part unspecified, initial encounter: Secondary | ICD-10-CM | POA: Insufficient documentation

## 2022-03-14 DIAGNOSIS — I10 Essential (primary) hypertension: Secondary | ICD-10-CM | POA: Insufficient documentation

## 2022-03-14 DIAGNOSIS — X58XXXA Exposure to other specified factors, initial encounter: Secondary | ICD-10-CM | POA: Insufficient documentation

## 2022-03-14 DIAGNOSIS — Z794 Long term (current) use of insulin: Secondary | ICD-10-CM | POA: Insufficient documentation

## 2022-03-14 DIAGNOSIS — I480 Paroxysmal atrial fibrillation: Secondary | ICD-10-CM | POA: Insufficient documentation

## 2022-03-14 DIAGNOSIS — Z91040 Latex allergy status: Secondary | ICD-10-CM | POA: Insufficient documentation

## 2022-03-14 LAB — BASIC METABOLIC PANEL
Anion gap: 10 (ref 5–15)
BUN: 5 mg/dL — ABNORMAL LOW (ref 6–20)
CO2: 25 mmol/L (ref 22–32)
Calcium: 9 mg/dL (ref 8.9–10.3)
Chloride: 103 mmol/L (ref 98–111)
Creatinine, Ser: 0.54 mg/dL (ref 0.44–1.00)
GFR, Estimated: >60 mL/min (ref >60)
Glucose, Bld: 373 mg/dL — ABNORMAL HIGH (ref 70–99)
Potassium: 3.3 mmol/L — ABNORMAL LOW (ref 3.5–5.1)
Sodium: 138 mmol/L (ref 135–145)

## 2022-03-14 LAB — CBC
HCT: 40.1 % (ref 36.0–46.0)
Hemoglobin: 13.9 g/dL (ref 12.0–15.0)
MCH: 29.6 pg (ref 26.0–34.0)
MCHC: 34.7 g/dL (ref 30.0–36.0)
MCV: 85.5 fL (ref 80.0–100.0)
Platelets: 238 10*3/uL (ref 150–400)
RBC: 4.69 MIL/uL (ref 3.87–5.11)
RDW: 12.7 % (ref 11.5–15.5)
WBC: 12.2 10*3/uL — ABNORMAL HIGH (ref 4.0–10.5)
nRBC: 0 % (ref 0.0–0.2)

## 2022-03-14 LAB — TROPONIN I (HIGH SENSITIVITY)
Troponin I (High Sensitivity): 2 ng/L (ref <18)
Troponin I (High Sensitivity): 3 ng/L (ref <18)

## 2022-03-14 MED ORDER — LIDOCAINE VISCOUS HCL 2 % MT SOLN
15.0000 mL | Freq: Once | OROMUCOSAL | Status: AC
  Administered 2022-03-14: 15 mL via ORAL
  Filled 2022-03-14: qty 15

## 2022-03-14 MED ORDER — ALUM & MAG HYDROXIDE-SIMETH 200-200-20 MG/5ML PO SUSP
30.0000 mL | Freq: Once | ORAL | Status: AC
Start: 2022-03-14 — End: 2022-03-14
  Administered 2022-03-14: 30 mL via ORAL
  Filled 2022-03-14: qty 30

## 2022-03-14 NOTE — ED Provider Notes (Signed)
[facility] DEPT Provider Note   CSN: [id number] Arrival date & time: 03/14/22  1616     History  No chief complaint on file.   Anna Golden is a 61 y.o. female.  61 year old female with a past medical history of hypertension, PAF sent to the ED with a chief complaint of foreign body stuck in her throat.  Patient reports having chicken and rice on Saturday, felt like she had a piece of bone get stuck on her throat, she tried to make herself vomit.  She has tried taking some liquids, frequent swallowing without any improvement in her symptoms.  No prior episodes like these in the past.  She reports the pain is mostly along the right side of her neck, where she feels something "shifting forward and backwards every time I swallow".  She has had some decrease in oral intake, states that she is only able to tolerate her saliva along with liquids. Endorsing pain along her chest and neck with any type of swallowing. No abdominal pain, nausea, or vomiting.   The history is provided by the patient.       Home Medications Prior to Admission medications   Medication Sig Start Date End Date Taking? Authorizing Provider  acetaminophen (TYLENOL) 650 MG CR tablet Take 650 mg by mouth every 8 (eight) hours as needed for pain. Patient not taking: Reported on 11/07/2021    [provider]  aspirin EC 81 MG tablet Take 1 tablet (81 mg total) by mouth daily. Swallow whole. 11/02/20   Elsie Stain, MD  atorvastatin (LIPITOR) 40 MG tablet TAKE 1 TABLET (40 MG TOTAL) BY MOUTH ONCE DAILY. 11/07/21 11/07/22  Elsie Stain, MD  Blood Glucose Monitoring Suppl (TRUE METRIX METER) w/Device KIT Use to measure blood sugar twice a day 08/01/20   Elsie Stain, MD  diltiazem (CARDIZEM CD) 120 MG 24 hr capsule TAKE 1 CAPSULE BY MOUTH DAILY. 11/07/21 11/07/22  Elsie Stain, MD  glucose blood (TRUE METRIX BLOOD GLUCOSE TEST) test strip Use as instructed 08/01/20    Elsie Stain, MD  ibuprofen (ADVIL) 800 MG tablet Take 1 tablet (800 mg total) by mouth 3 (three) times daily. Patient not taking: Reported on 11/07/2021 10/02/21   Prosperi, Christian H, PA-C  Insulin Glargine [facility]) 100 UNIT/ML Inject 45 Units into the skin once daily. 12/12/21   Elsie Stain, MD  Insulin Pen Needle (TRUEPLUS 5-BEVEL PEN NEEDLES) 32G X 4 MM MISC Use to inject insulin once daily. 11/07/21   Elsie Stain, MD  Liniments (PAIN RELIEF EX) Apply 1 application topically 4 (four) times daily as needed (pain). Patient not taking: Reported on 11/02/2020    [provider]  metFORMIN (GLUCOPHAGE-XR) 500 MG 24 hr tablet Take 2 tablets (1,000 mg total) by mouth 2 (two) times daily. 12/12/21   Elsie Stain, MD  gabapentin (NEURONTIN) 300 MG capsule Take 1 capsule (300 mg total) by mouth 3 (three) times daily. Patient not taking: No sig reported 08/01/20 11/02/20  Elsie Stain, MD  insulin aspart protamine- aspart (NOVOLOG MIX 70/30) (70-30) 100 UNIT/ML injection Inject 0.08 mLs (8 Units total) into the skin 2 (two) times daily with a meal. Patient not taking: Reported on 01/15/2015 04/26/14 01/26/15  Lorayne Marek, MD  insulin lispro (HUMALOG) 100 UNIT/ML KwikPen 5 units twice a day with two largest meals of the day Patient not taking: Reported on 11/02/2020 08/01/20 11/02/20  Elsie Stain, MD  potassium chloride (KLOR-CON) 10 MEQ tablet Take 1 tablet (10 mEq total) by mouth daily for 21 days. Patient not taking: Reported on 11/02/2020 08/01/20 11/02/20  Elsie Stain, MD      Allergies    Metoprolol, Other, and Latex    Review of Systems   Review of Systems  Constitutional:  Negative for fever.  HENT:  Negative for sore throat.   Respiratory:  Negative for shortness of breath.   Cardiovascular:  Positive for chest pain.  Gastrointestinal:  Negative for abdominal pain, anal bleeding, nausea and vomiting.  Genitourinary:  Negative for flank pain.   Musculoskeletal:  Positive for neck pain.  Neurological:  Negative for light-headedness and headaches.  All other systems reviewed and are negative.   Physical Exam Updated Vital Signs BP (!) 144/92   Pulse 98   Temp 98.1 F (36.7 C)   Resp 20   Ht 5' 3"  (1.6 m)   Wt 85.7 kg   LMP 12/20/2014   SpO2 99%   BMI 33.48 kg/m  Physical Exam Vitals and nursing note reviewed.  Constitutional:      Appearance: Normal appearance.  HENT:     Head: Normocephalic and atraumatic.     Nose: Nose normal.     Mouth/Throat:     Mouth: Mucous membranes are moist.     Pharynx: No oropharyngeal exudate or posterior oropharyngeal erythema.  Eyes:     Pupils: Pupils are equal, round, and reactive to light.  Cardiovascular:     Rate and Rhythm: Normal rate.  Pulmonary:     Effort: Pulmonary effort is normal.  Abdominal:     General: Abdomen is flat.  Musculoskeletal:     Cervical back: Normal range of motion and neck supple.  Skin:    General: Skin is warm and dry.     Coloration: Skin is not jaundiced.  Neurological:     Mental Status: She is alert and oriented to person, place, and time.     ED Results / Procedures / Treatments   Labs (all labs ordered are listed, but only abnormal results are displayed) Labs Reviewed  BASIC METABOLIC PANEL - Abnormal; Notable for the following components:      Result Value   Potassium 3.3 (*)    Glucose, Bld 373 (*)    BUN 5 (*)    All other components within normal limits  CBC - Abnormal; Notable for the following components:   WBC 12.2 (*)    All other components within normal limits  TROPONIN I (HIGH SENSITIVITY)  TROPONIN I (HIGH SENSITIVITY)    EKG None  Radiology CT Soft Tissue Neck Wo Contrast  Result Date: 03/14/2022 CLINICAL DATA:  Believes she swallowed a chicken bone EXAM: CT NECK WITHOUT CONTRAST TECHNIQUE: Multidetector CT imaging of the neck was performed following the standard protocol without intravenous contrast.  RADIATION DOSE REDUCTION: This exam was performed according to the departmental dose-optimization program which includes automated exposure control, adjustment of the mA and/or kV according to patient size and/or use of iterative reconstruction technique. COMPARISON:  No prior neck CT, correlation is made with CT cervical spine 10/02/2021 FINDINGS: Pharynx and larynx: Normal. No mass or swelling. No evidence of foreign body. Salivary glands: No inflammation, mass, or stone. Thyroid: 1.9 cm hypoattenuating lesion in the right thyroid lobe. Lymph nodes: None enlarged or abnormal density. Vascular: Normal noncontrast appearance of the vasculature. Limited intracranial: Negative. Visualized orbits: Status post bilateral lens replacements. Otherwise unremarkable. Mastoids and visualized paranasal  sinuses: Small mucous retention cyst in the right maxillary sinus. Otherwise clear paranasal sinuses. The mastoids are well aerated. Skeleton: No acute osseous abnormality. Mild degenerative changes in the cervical spine. Upper chest: No focal pulmonary opacity or pleural effusion. Other: None. IMPRESSION: 1. No evidence of foreign body. 2. 1.9 cm hypoattenuating lesion in the right thyroid lobe. If this has not previously been evaluated, a non-emergent ultrasound of the thyroid is recommended. (Reference: J Am Coll Radiol. 2015 Feb;12(2): 143-50) Electronically Signed   By: Merilyn Baba M.D.   On: 03/14/2022 19:46   DG Neck Soft Tissue  Result Date: 03/14/2022 CLINICAL DATA:  chicken bone sensation of the throat. Patient believes she swallowed a chicken bone on Saturday and has been having difficulty swallowing since then. EXAM: NECK SOFT TISSUES - 1+ VIEW COMPARISON:  None Available. FINDINGS: There is no evidence of retropharyngeal soft tissue swelling or epiglottic enlargement. There is some irregularity with heterogeneous attenuation seen at the region of the thyroid bone at the level of upper aspect of the C5  vertebra. There are apparent small gas pockets seen at the region of the heterogeneous attenuation. IMPRESSION: There is some irregularity with heterogeneous attenuation seen at the region of the thyroid bone in the lateral projection at the level of the C5 vertebra. Foreign body can not be entirely excluded. Suggest ENT consultation and endoscopic evaluation/CT of the soft tissue of the neck. Electronically Signed   By: Frazier Richards M.D.   On: 03/14/2022 18:02   DG Chest 2 View  Result Date: 03/14/2022 CLINICAL DATA:  Chest pain EXAM: CHEST - 2 VIEW COMPARISON:  06/09/2020 FINDINGS: Cardiac and mediastinal contours are within normal limits. No focal pulmonary opacity. No pleural effusion or pneumothorax. No acute osseous abnormality. IMPRESSION: No acute cardiopulmonary process. Electronically Signed   By: Merilyn Baba M.D.   On: 03/14/2022 17:42    Procedures Procedures    Medications Ordered in ED Medications  alum & mag hydroxide-simeth (MAALOX/MYLANTA) 200-200-20 MG/5ML suspension 30 mL (30 mLs Oral Given 03/14/22 2045)    And  lidocaine (XYLOCAINE) 2 % viscous mouth solution 15 mL (15 mLs Oral Given 03/14/22 2045)    ED Course/ Medical Decision Making/ A&P                           Medical Decision Making Amount and/or Complexity of Data Reviewed Radiology: ordered.  Risk OTC drugs. Prescription drug management.   This patient presents to the ED for concern of foreign body, this involves a number of treatment options, and is a complaint that carries with it a high risk of complications and morbidity.  The differential diagnosis includes food impaction, esophageal tear.    Co morbidities: Discussed in HPI   Brief History:  Patient here with 4 days of sore throat after swallowing chicken bone on Saturday.  No improvement despite trying liquids, only clear food.  Try to make herself vomit without improvement in symptoms.  EMR reviewed including pt PMHx, past surgical  history and past visits to ER.   See HPI for more details   Lab Tests:  I ordered and independently interpreted labs.  The pertinent results include:    Labs notable for CBC with a slight leukocytosis.  Hemoglobin is within normal limits.  BMP without any electrolyte derangements and from some slight hypokalemia.  Glucose is elevated with ongoing history of diabetes.   Imaging Studies:  Multiple studies were obtained such as  chest x-ray, neck soft tissue, CT soft tissue neck without any evidence of foreign body at this time.  These results were reviewed with gastroenterology on-call.  Medicines ordered:  I ordered medication including Maalox  for symptomatic  Reevaluation of the patient after these medicines showed that the patient stayed the same I have reviewed the patients home medicines and have made adjustments as needed  Consults:  8:32 PM I requested consultation with Gastroenterology Dr. Alessandra Bevels ,  and discussed lab and imaging findings as well as pertinent plan - they recommend: Starting patient on PPI, given prescription for Carafate along with following up outpatient if they do not improve.  Perhaps a ENT referral would be helpful as well  Reevaluation:  After the interventions noted above I re-evaluated patient and found that they have :improved   Social Determinants of Health:  The patient's social determinants of health were a factor in the care of this patient    Problem List / ED Course:  Patient here with 4 days of foreign body sensation in her throat after eating chicken bones and rice on Saturday.  Has tried multiple attempts at removing this without any improvement in symptoms.  She is tolerating her secretions, speaking in full sentences without any difficulty breathing and stable vital signs.  X-ray, CT soft tissue neck did not show any acute foreign body, however some suspicion for noted on the right side of her thyroid.  I discussed these results with  gastroenterology who recommended Carafate along with PPI.  Patient is mentating okay, given Maalox while in the ED.  I did also discuss case with ENT Dr. Fredric Dine, I appreciate her assistance who will attempt to scope patient in the office tomorrow, I discussed this with patient along with outpatient follow-up in the next 24 hours.  She is agreeable to following up outpatient.  She is not showing any signs of distress, no hypoxia.  Patient stable for outpatient treatment.   Dispostion:  After consideration of the diagnostic results and the patients response to treatment, I feel that the patent would benefit from patient scope by ENT.  Strict return precautions provided at length.   Portions of this note were generated with Lobby[employer]. Dictation errors may occur despite best attempts at proofreading.   Final Clinical Impression(s) / ED Diagnoses Final diagnoses:  Foreign body in digestive tract, initial encounter    Rx / DC Orders ED Discharge Orders     None         Janeece Fitting, Hershal Coria 03/14/22 2108    Margette Fast, MD 03/21/22 [phone]

## 2022-03-14 NOTE — ED Provider Triage Note (Signed)
Emergency Medicine Provider Triage Evaluation Note  Anna Golden , a 61 y.o. female  was evaluated in triage.  Pt complains of dysphagia. Pt report she believes she may have swallowed a chicken bone 5 days ago. Endorse neck discomfort, felt like she wants to regurgitate, also has hx of afib and report midsternal cp, and heart palpitation.  Endorse throat discomfort.  Was eating chicken and rice.    Review of Systems  Positive: As above Negative: As above  Physical Exam  BP (!) 148/82 (BP Location: Left Arm)   Pulse 92   Temp 98.6 F (37 C) (Oral)   Resp 18   Ht '5\' 3"'$  (1.6 m)   Wt 85.7 kg   LMP 12/20/2014   SpO2 100%   BMI 33.48 kg/m  Gen:   Awake, no distress   Resp:  Normal effort  MSK:   Moves extremities without difficulty  Other:    Medical Decision Making  Medically screening exam initiated at 4:43 PM.  Appropriate orders placed.  Anna Golden was informed that the remainder of the evaluation will be completed by another provider, this initial triage assessment does not replace that evaluation, and the importance of remaining in the ED until their evaluation is complete.     Domenic Moras, PA-C 03/14/22 1650

## 2022-03-14 NOTE — Discharge Instructions (Addendum)
Please call Dr. Dorathy Kinsman office in the morning in order to have an appointment for tomorrow to have a scope.  He will need to tell the office that you were evaluated in the emergency department and you were told to call.  If you experience any difficulty breathing, difficulty tolerating your saliva you will need to call 911.

## 2022-03-14 NOTE — ED Triage Notes (Signed)
PT states she believes she swallowed a chicken bone on Saturday. States she has been having difficulty swallowing since then and has had an episode of difficulty breathing. Airway currently patent. NAD noted at this time.

## 2022-03-19 NOTE — Progress Notes (Deleted)
Established Patient Office Visit  Subjective:  Patient ID: Anna Golden, female    DOB: 1960/09/20  Age: 61 y.o. MRN: [id number]  CC: Primary care to reestablish  HPI 11/13/21 Anna Golden presents for primary care to reestablish with prior history of hypertension and type 2 diabetes.  This patient has difficulty with using needles for insulin and been trying to manage her orally.  We tried on Farxiga but she did not tolerate this well.  She is only been taking metformin and she misses many doses.  Have not seen her since March 2022.  She is due a variety of primary care screenings  On arrival blood sugar 326 blood pressure 133/68.  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  She does not follow a healthy diet.  She fell February 6 because of weakness and was found to be volume depleted.  She was given a liter of saline had a series of x-rays which were negative.  She still has some soreness in the right hip and in the shoulders.  As stated she cannot tolerate Wilder Glade because of side effects.  She is complaining of right upper quadrant abdominal pain and thyromegaly.  She is still smoking a pack every 2 days of cigarettes.  She does not drink alcohol.  Patient also complains of bilateral wrist pain and weakness in the hands.  She also has frequent falls at home as well.  She states she has difficulty walking because of neuropathy in the feet.  The patient has a cane at home but she rarely uses this.  Patient's had physical therapy when she lived in [place] but she declines to receive this again.  She does not have any insurance she is working on [employer].  7/25 Luke 11/2021 Diabetes longstanding currently uncontrolled. Patient is able to verbalize appropriate hypoglycemia management plan. Medication adherence appears suboptimal - will have her resume metformin and increase Basaglar dose. -Increased dose of Basaglar to 45u daily.  - Changed  metformin to  metformin XR 500 mg. Pt instructed to take 2 tablets (1045m) BID.   -Patient educated on purpose, proper use, and potential adverse effects of metformin XR.  -Extensively discussed pathophysiology of diabetes, recommended lifestyle interventions, dietary effects on blood sugar control.  -Counseled on s/sx of and management of hypoglycemia.  -Next A1c anticipated 01/2022.       Cardiovascular and Mediastinum   Hypertension (Chronic)    Blood pressure well controlled at this time continue diltiazem which is controlling her rate and blood pressure      Relevant Medications   atorvastatin (LIPITOR) 40 MG tablet   diltiazem (CARDIZEM CD) 120 MG 24 hr capsule   Other Relevant Orders   Thyroid Panel With TSH   RESOLVED: PAF (paroxysmal atrial fibrillation) (HCC)    Currently in sinus rhythm this is not an active problem      Relevant Medications   atorvastatin (LIPITOR) 40 MG tablet   diltiazem (CARDIZEM CD) 120 MG 24 hr capsule     Endocrine   Poorly controlled type 2 diabetes mellitus with peripheral neuropathy ([facility] - Primary    Poorly controlled type 2 diabetes with severe peripheral neuropathy resulting in weakness in the hands trigger finger frequent falls balance issues  I offered physical therapy the patient declined  Improved control of diabetes essential  Patient was finally consented and agreed to begin insulin we will begin insulin glargine 35 units daily.  We gave her  very small needles.  She will resume metformin at 1000 mg twice daily  We will obtain urine for microalbumin  We will obtain hemoglobin A1c with a blood draw  Will refer to endocrinology      Relevant Medications   atorvastatin (LIPITOR) 40 MG tablet   Insulin Glargine (BASAGLAR KWIKPEN) 100 UNIT/ML   metFORMIN (GLUCOPHAGE) 1000 MG tablet   Other Relevant Orders   Microalbumin / creatinine urine ratio   Basic Metabolic Panel   Lipid panel   Hemoglobin A1c   Ambulatory referral to Endocrinology    Thyromegaly    Thyroid is enlarged will obtain thyroid ultrasound thyroid function      Relevant Orders   US THYROID     Musculoskeletal and Integument   Arthritis    Referral made to orthopedics      Trigger finger    Referral made to orthopedics      Relevant Orders   Ambulatory referral to Orthopedic Surgery     Other   Smoking       Current smoking consumption amount: 10 cigarettes daily  Dicsussion on advise to quit smoking and smoking impacts: Cardiovascular health impacts  Patient's willingness to quit: Willing to quit  Methods to quit smoking discussed: Behavioral modification nicotine replacement  Medication management of smoking session drugs discussed: Nicotine patches  Resources provided:  AVS   Setting quit date not established  Follow-up arranged 1 month   Time spent counseling the patient: 5 minutes       Mixed hyperlipidemia   Relevant Medications   atorvastatin (LIPITOR) 40 MG tablet   diltiazem (CARDIZEM CD) 120 MG 24 hr capsule   Pain in both wrists   Relevant Orders   Ambulatory referral to Orthopedic Surgery   Hip pain    Referral to orthopedics made      Relevant Orders   Ambulatory referral to Orthopedic Surgery   RUQ abdominal pain    Assess with metabolic panel and ultrasound of abdomen      Relevant Orders   US Abdomen Complete   Frequent falls    Frequent falls with neuropathy needs to use cane more often      Past Medical History:  Diagnosis Date   Acute asthma exacerbation 03/07/2014   Arthritis    Asthma    DKA (diabetic ketoacidoses) 03/07/2014   DM (diabetes mellitus) ([place]) 04/24/2013   Dysmenorrhea 01/23/2013   Fatty liver disease, nonalcoholic    Hypertension    Leiomyoma of uterus, unspecified 01/23/2013   Leukocytosis    Menorrhagia with regular cycle 01/23/2013   Obesity    PAF (paroxysmal atrial fibrillation) ([place])    a. Dx 03/2013, spont converted to NSR.   Sickle cell trait ([place])    Smoking  03/18/2014    Past Surgical History:  Procedure Laterality Date   HERNIA REPAIR      Family History  Problem Relation Age of Onset   Heart disease Mother    Diabetes Mother    Sickle cell anemia Mother    Cancer Father        colon    Social History   Socioeconomic History   Marital status: Legally Separated    Spouse name: Not on file   Number of children: Not on file   Years of education: Not on file   Highest education level: Not on file  Occupational History   Not on file  Tobacco Use   Smoking status: Every Day    Packs/day:  0.25    Types: Cigarettes   Smokeless tobacco: Never  Vaping Use   Vaping Use: Never used  Substance and Sexual Activity   Alcohol use: No   Drug use: No   Sexual activity: Yes  Other Topics Concern   Not on file  Social History Narrative   Not on file   Social Determinants of Health   Financial Resource Strain: Not on file  Food Insecurity: Not on file  Transportation Needs: Not on file  Physical Activity: Not on file  Stress: Not on file  Social Connections: Not on file  Intimate Partner Violence: Not on file    Outpatient Medications Prior to Visit  Medication Sig Dispense Refill   acetaminophen (TYLENOL) 650 MG CR tablet Take 650 mg by mouth every 8 (eight) hours as needed for pain. (Patient not taking: Reported on 11/07/2021)     aspirin EC 81 MG tablet Take 1 tablet (81 mg total) by mouth daily. Swallow whole. 100 tablet 1   atorvastatin (LIPITOR) 40 MG tablet TAKE 1 TABLET (40 MG TOTAL) BY MOUTH ONCE DAILY. 90 tablet 1   Blood Glucose Monitoring Suppl (TRUE METRIX METER) w/Device KIT Use to measure blood sugar twice a day 1 kit 0   diltiazem (CARDIZEM CD) 120 MG 24 hr capsule TAKE 1 CAPSULE BY MOUTH DAILY. 60 capsule 2   glucose blood (TRUE METRIX BLOOD GLUCOSE TEST) test strip Use as instructed 100 each 12   ibuprofen (ADVIL) 800 MG tablet Take 1 tablet (800 mg total) by mouth 3 (three) times daily. (Patient not taking:  Reported on 11/07/2021) 21 tablet 0   Insulin Glargine (BASAGLAR KWIKPEN) 100 UNIT/ML Inject 45 Units into the skin once daily. 15 mL 2   Insulin Pen Needle (TRUEPLUS 5-BEVEL PEN NEEDLES) 32G X 4 MM MISC Use to inject insulin once daily. 100 each 2   Liniments (PAIN RELIEF EX) Apply 1 application topically 4 (four) times daily as needed (pain). (Patient not taking: Reported on 11/02/2020)     metFORMIN (GLUCOPHAGE-XR) 500 MG 24 hr tablet Take 2 tablets (1,000 mg total) by mouth 2 (two) times daily. 120 tablet 2   No facility-administered medications prior to visit.    Allergies  Allergen Reactions   Metoprolol Other (See Comments)    "Patient slept for whole day after starting medication this week.  Patient does not want to take this med.  Not sure if med causes hypotension, but could not get out of bed for any reason"   Other Other (See Comments)    ANESTHESIA-CAUSES [place], 2008.     Latex Itching    Swell up    ROS Review of Systems  Constitutional:  Positive for fatigue.  HENT: Negative.  Negative for ear pain, postnasal drip, rhinorrhea, sinus pressure, sore throat, trouble swallowing and voice change.   Eyes: Negative.   Respiratory: Negative.  Negative for apnea, cough, choking, chest tightness, shortness of breath, wheezing and stridor.   Cardiovascular: Negative.  Negative for chest pain, palpitations and leg swelling.  Gastrointestinal:  Positive for abdominal distention and abdominal pain. Negative for anal bleeding, blood in stool, constipation, diarrhea, nausea and vomiting.  Endocrine: Positive for polydipsia, polyphagia and polyuria.  Genitourinary:  Positive for enuresis.  Musculoskeletal:  Positive for gait problem. Negative for arthralgias and myalgias.  Skin: Negative.  Negative for rash.  Allergic/Immunologic: Negative.  Negative for environmental allergies and food allergies.  Neurological:  Positive for syncope, weakness and  numbness. Negative for dizziness, tremors, seizures, facial asymmetry, speech difficulty, light-headedness and headaches.  Hematological: Negative.  Negative for adenopathy. Does not bruise/bleed easily.  Psychiatric/Behavioral: Negative.  Negative for agitation and sleep disturbance. The patient is not nervous/anxious.       Objective:    Physical Exam Vitals reviewed.  Constitutional:      Appearance: Normal appearance. She is well-developed. She is obese. She is not diaphoretic.  HENT:     Head: Normocephalic and atraumatic.     Nose: No nasal deformity, septal deviation, mucosal edema or rhinorrhea.     Right Sinus: No maxillary sinus tenderness or frontal sinus tenderness.     Left Sinus: No maxillary sinus tenderness or frontal sinus tenderness.     Mouth/Throat:     Pharynx: No oropharyngeal exudate.  Eyes:     General: No scleral icterus.    Conjunctiva/sclera: Conjunctivae normal.     Pupils: Pupils are equal, round, and reactive to light.  Neck:     Thyroid: Thyromegaly present.     Vascular: No carotid bruit or JVD.     Trachea: Trachea normal. No tracheal tenderness or tracheal deviation.  Cardiovascular:     Rate and Rhythm: Normal rate and regular rhythm.     Chest Wall: PMI is not displaced.     Pulses: Normal pulses. No decreased pulses.     Heart sounds: Normal heart sounds, S1 normal and S2 normal. Heart sounds not distant. No murmur heard.    No systolic murmur is present.     No diastolic murmur is present.     No friction rub. No gallop. No S3 or S4 sounds.  Pulmonary:     Effort: No tachypnea, accessory muscle usage or respiratory distress.     Breath sounds: No stridor. No decreased breath sounds, wheezing, rhonchi or rales.  Chest:     Chest wall: No tenderness.  Abdominal:     General: Bowel sounds are normal. There is no distension.     Palpations: Abdomen is soft. Abdomen is not rigid.     Tenderness: There is no abdominal tenderness. There is no  guarding or rebound.  Musculoskeletal:        General: Normal range of motion.     Cervical back: Normal range of motion and neck supple. No edema, erythema or rigidity. No muscular tenderness. Normal range of motion.  Lymphadenopathy:     Head:     Right side of head: No submental or submandibular adenopathy.     Left side of head: No submental or submandibular adenopathy.     Cervical: No cervical adenopathy.  Skin:    General: Skin is warm and dry.     Coloration: Skin is not pale.     Findings: No rash.     Nails: There is no clubbing.  Neurological:     Mental Status: She is alert and oriented to person, place, and time.     Cranial Nerves: Cranial nerves 2-12 are intact.     Sensory: No sensory deficit.     Motor: Motor function is intact.     Coordination: Coordination abnormal.     Comments: Weakness in both hands  Psychiatric:        Speech: Speech normal.        Behavior: Behavior normal.     LMP 12/20/2014  Wt Readings from Last 3 Encounters:  03/14/22 189 lb (85.7 kg)  11/07/21 164 lb 12.8 oz (74.8 kg)  10/02/21 174  lb 2.6 oz (79 kg)     Health Maintenance Due  Topic Date Due   OPHTHALMOLOGY EXAM  Never done   TETANUS/TDAP  Never done   MAMMOGRAM  Never done   Zoster Vaccines- Shingrix (1 of 2) Never done   PAP SMEAR-Modifier  01/24/2016   COVID-19 Vaccine (3 - Moderna series) 07/16/2020   FOOT EXAM  08/01/2021   URINE MICROALBUMIN  08/01/2021   COLON CANCER SCREENING ANNUAL FOBT  08/01/2021    There are no preventive care reminders to display for this patient.  Lab Results  Component Value Date   TSH 1.640 11/07/2021   Lab Results  Component Value Date   WBC 12.2 (H) 03/14/2022   HGB 13.9 03/14/2022   HCT 40.1 03/14/2022   MCV 85.5 03/14/2022   PLT 238 03/14/2022   Lab Results  Component Value Date   NA 138 03/14/2022   K 3.3 (L) 03/14/2022   CO2 25 03/14/2022   GLUCOSE 373 (H) 03/14/2022   BUN 5 (L) 03/14/2022   CREATININE 0.54  03/14/2022   BILITOT 0.4 08/30/2020   ALKPHOS 38 08/30/2020   AST 16 08/30/2020   ALT 15 08/30/2020   PROT 7.1 08/30/2020   ALBUMIN 3.6 08/30/2020   CALCIUM 9.0 03/14/2022   ANIONGAP 10 03/14/2022   EGFR 92 11/07/2021   Lab Results  Component Value Date   CHOL 159 11/07/2021   Lab Results  Component Value Date   HDL 59 11/07/2021   Lab Results  Component Value Date   LDLCALC 77 11/07/2021   Lab Results  Component Value Date   TRIG 129 11/07/2021   Lab Results  Component Value Date   CHOLHDL 2.7 11/07/2021   Lab Results  Component Value Date   HGBA1C 14.0 (H) 11/07/2021      Assessment & Plan:   Problem List Items Addressed This Visit   None  No orders of the defined types were placed in this encounter. 38 minutes spent complex decision making is high Mammogram is ordered and fecal occult kit issued for colon cancer screening Follow-up: No follow-ups on file.    Asencion Noble, MD

## 2022-03-20 ENCOUNTER — Ambulatory Visit: Payer: Self-pay | Admitting: Critical Care Medicine

## 2022-05-15 ENCOUNTER — Other Ambulatory Visit: Payer: Self-pay

## 2022-05-22 ENCOUNTER — Other Ambulatory Visit: Payer: Self-pay

## 2022-05-31 ENCOUNTER — Other Ambulatory Visit: Payer: Self-pay

## 2022-07-23 ENCOUNTER — Ambulatory Visit: Payer: Self-pay | Admitting: Internal Medicine

## 2022-07-23 NOTE — Progress Notes (Deleted)
Name: [name]  MRN/ DOB: [id number], [birth year]   Age/ Sex: 61 y.o., female    PCP: Elsie Stain, MD   Reason for Endocrinology Evaluation: Type 2 Diabetes Mellitus     Date of Initial Endocrinology Visit: 07/23/2022     PATIENT IDENTIFIER: Anna Golden is a 61 y.o. female with a past medical history of ***. The patient presented for initial endocrinology clinic visit on 07/23/2022 for consultative assistance with her diabetes management.    HPI: Anna Golden was    Diagnosed with DM *** Prior Medications tried/Intolerance: *** Currently checking blood sugars *** x / day,  before breakfast and ***.  Hypoglycemia episodes : ***               Symptoms: ***                 Frequency: ***/  Hemoglobin A1c has ranged from *** in ***, peaking at *** in ***. Patient required assistance for hypoglycemia:  Patient has required hospitalization within the last 1 year from hyper or hypoglycemia:   In terms of diet, the patient ***   HOME DIABETES REGIMEN: Metformin 500 mg XR Basaglar   Statin: Yes ACE-I/ARB: no Prior Diabetic Education: {Yes/No:11203}   METER DOWNLOAD SUMMARY: Date range evaluated: *** Fingerstick Blood Glucose Tests = *** Average Number Tests/Day = *** Overall Mean FS Glucose = *** Standard Deviation = ***  BG Ranges: Low = *** High = ***   Hypoglycemic Events/30 Days: BG < 50 = *** Episodes of symptomatic severe hypoglycemia = ***   DIABETIC COMPLICATIONS: Microvascular complications:  Neuropathy Denies: *** Last eye exam: Completed   Macrovascular complications:  *** Denies: CAD, PVD, CVA   PAST HISTORY: Past Medical History:  Past Medical History:  Diagnosis Date   Acute asthma exacerbation 03/07/2014   Arthritis    Asthma    DKA (diabetic ketoacidoses) 03/07/2014   DM (diabetes mellitus) ([place]) 04/24/2013   Dysmenorrhea 01/23/2013   Fatty liver disease, nonalcoholic    Hypertension    Leiomyoma of uterus, unspecified  01/23/2013   Leukocytosis    Menorrhagia with regular cycle 01/23/2013   Obesity    PAF (paroxysmal atrial fibrillation) ([place])    a. Dx 03/2013, spont converted to NSR.   Sickle cell trait ([place])    Smoking 03/18/2014   Past Surgical History:  Past Surgical History:  Procedure Laterality Date   HERNIA REPAIR      Social History:  reports that she has been smoking cigarettes. She has been smoking an average of .25 packs per day. She has never used smokeless tobacco. She reports that she does not drink alcohol and does not use drugs. Family History:  Family History  Problem Relation Age of Onset   Heart disease Mother    Diabetes Mother    Sickle cell anemia Mother    Cancer Father        colon     HOME MEDICATIONS: Allergies as of 07/23/2022       Reactions   Metoprolol Other (See Comments)   "Patient slept for whole day after starting medication this week.  Patient does not want to take this med.  Not sure if med causes hypotension, but could not get out of bed for any reason"   Other Other (See Comments)   ANESTHESIA-CAUSES [place], 2008.     Latex Itching   Swell up  Medication List        Accurate as of July 23, 2022 10:41 AM. If you have any questions, ask your nurse or doctor.          acetaminophen 650 MG CR tablet Commonly known as: TYLENOL Take 650 mg by mouth every 8 (eight) hours as needed for pain.   aspirin EC 81 MG tablet Take 1 tablet (81 mg total) by mouth daily. Swallow whole.   atorvastatin 40 MG tablet Commonly known as: LIPITOR TAKE 1 TABLET (40 MG TOTAL) BY MOUTH ONCE DAILY.   Basaglar KwikPen 100 UNIT/ML Inject 45 Units into the skin once daily.   diltiazem 120 MG 24 hr capsule Commonly known as: CARDIZEM CD TAKE 1 CAPSULE BY MOUTH DAILY.   ibuprofen 800 MG tablet Commonly known as: ADVIL Take 1 tablet (800 mg total) by mouth 3 (three) times daily.   metFORMIN 500 MG 24 hr  tablet Commonly known as: GLUCOPHAGE-XR Take 2 tablets (1,000 mg total) by mouth 2 (two) times daily.   PAIN RELIEF EX Apply 1 application topically 4 (four) times daily as needed (pain).   True Metrix Blood Glucose Test test strip Generic drug: glucose blood Use as instructed   True Metrix Meter w/Device Kit Use to measure blood sugar twice a day   TRUEplus 5-Bevel Pen Needles 32G X 4 MM Misc Generic drug: Insulin Pen Needle Use to inject insulin once daily.         ALLERGIES: Allergies  Allergen Reactions   Metoprolol Other (See Comments)    "Patient slept for whole day after starting medication this week.  Patient does not want to take this med.  Not sure if med causes hypotension, but could not get out of bed for any reason"   Other Other (See Comments)    ANESTHESIA-CAUSES [place], 2008.     Latex Itching    Swell up     REVIEW OF SYSTEMS: A comprehensive ROS was conducted with the patient and is negative except as per HPI and below:  ROS    OBJECTIVE:   VITAL SIGNS: LMP 12/20/2014    PHYSICAL EXAM:  General: Pt appears well and is in NAD  Neck: General: Supple without adenopathy or carotid bruits. Thyroid: Thyroid size normal.  No goiter or nodules appreciated.   Lungs: Clear with good BS bilat with no rales, rhonchi, or wheezes  Heart: RRR   Abdomen:  soft, nontender  Extremities:  Lower extremities - No pretibial edema. No lesions.  Skin: Normal texture and temperature to palpation. No rash noted.  Neuro: MS is good with appropriate affect, pt is alert and Ox3    DM foot exam:    DATA REVIEWED:  Lab Results  Component Value Date   HGBA1C 14.0 (H) 11/07/2021   HGBA1C 9.0 (A) 08/01/2020   HGBA1C 13.6 (A) 07/28/2019   Lab Results  Component Value Date   LDLCALC 77 11/07/2021   CREATININE 0.54 03/14/2022   Lab Results  Component Value Date   MICRALBCREAT 42 (H) 08/01/2020    Lab Results  Component  Value Date   CHOL 159 11/07/2021   HDL 59 11/07/2021   LDLCALC 77 11/07/2021   TRIG 129 11/07/2021   CHOLHDL 2.7 11/07/2021        ASSESSMENT / PLAN / RECOMMENDATIONS:   1) Type *** Diabetes Mellitus, ***controlled, With*** complications - Most recent A1c of *** %. Goal A1c < *** %.  ***  Plan:  GENERAL: ***  MEDICATIONS: ***  EDUCATION / INSTRUCTIONS: BG monitoring instructions: Patient is instructed to check her blood sugars *** times a day, ***. Call [facility] if: BG persistently < 70  I reviewed the Rule of 15 for the treatment of hypoglycemia in detail with the patient. Literature supplied.   2) Diabetic complications:  Eye: Does *** have known diabetic retinopathy.  Neuro/ Feet: Does *** have known diabetic peripheral neuropathy. Renal: Patient does *** have known baseline CKD. She is *** on an ACEI/ARB at present.  3) Lipids: Patient is *** on a statin.    4) Hypertension: ***  at goal of < 140/90 mmHg.       Signed electronically by: Mack Guise, MD  [facility]  [facility] [address] [place], [place] [address] Phone: [phone] FAX: [phone]   CC: Elsie Stain, MD [address] [place] [address] Phone: [phone]  Fax: [phone]    Return to Endocrinology clinic as below: Future Appointments  Date Time Provider [place]  07/23/2022  2:40 PM Remi Lopata, Melanie Crazier, MD [id number] None

## 2022-07-26 ENCOUNTER — Encounter (HOSPITAL_COMMUNITY): Payer: Self-pay

## 2022-07-26 ENCOUNTER — Emergency Department (HOSPITAL_COMMUNITY)
Admission: EM | Admit: 2022-07-26 | Discharge: 2022-07-27 | Disposition: A | Discharge status: Home / Self Care | Payer: Self-pay | Attending: Emergency Medicine | Admitting: Emergency Medicine

## 2022-07-26 ENCOUNTER — Emergency Department (HOSPITAL_COMMUNITY): Payer: Self-pay

## 2022-07-26 ENCOUNTER — Other Ambulatory Visit: Payer: Self-pay

## 2022-07-26 DIAGNOSIS — J209 Acute bronchitis, unspecified: Secondary | ICD-10-CM | POA: Insufficient documentation

## 2022-07-26 DIAGNOSIS — Z91040 Latex allergy status: Secondary | ICD-10-CM | POA: Insufficient documentation

## 2022-07-26 DIAGNOSIS — Z20822 Contact with and (suspected) exposure to covid-19: Secondary | ICD-10-CM | POA: Insufficient documentation

## 2022-07-26 DIAGNOSIS — J45909 Unspecified asthma, uncomplicated: Secondary | ICD-10-CM | POA: Insufficient documentation

## 2022-07-26 DIAGNOSIS — Z7984 Long term (current) use of oral hypoglycemic drugs: Secondary | ICD-10-CM | POA: Insufficient documentation

## 2022-07-26 DIAGNOSIS — Z7982 Long term (current) use of aspirin: Secondary | ICD-10-CM | POA: Insufficient documentation

## 2022-07-26 DIAGNOSIS — R0789 Other chest pain: Secondary | ICD-10-CM

## 2022-07-26 DIAGNOSIS — E119 Type 2 diabetes mellitus without complications: Secondary | ICD-10-CM | POA: Insufficient documentation

## 2022-07-26 DIAGNOSIS — Z794 Long term (current) use of insulin: Secondary | ICD-10-CM | POA: Insufficient documentation

## 2022-07-26 DIAGNOSIS — F17210 Nicotine dependence, cigarettes, uncomplicated: Secondary | ICD-10-CM | POA: Insufficient documentation

## 2022-07-26 DIAGNOSIS — I10 Essential (primary) hypertension: Secondary | ICD-10-CM | POA: Insufficient documentation

## 2022-07-26 LAB — BASIC METABOLIC PANEL
Anion gap: 7 (ref 5–15)
BUN: 15 mg/dL (ref 6–20)
CO2: 26 mmol/L (ref 22–32)
Calcium: 8.8 mg/dL — ABNORMAL LOW (ref 8.9–10.3)
Chloride: 106 mmol/L (ref 98–111)
Creatinine, Ser: 0.79 mg/dL (ref 0.44–1.00)
GFR, Estimated: >60 mL/min (ref >60)
Glucose, Bld: 350 mg/dL — ABNORMAL HIGH (ref 70–99)
Potassium: 3.5 mmol/L (ref 3.5–5.1)
Sodium: 139 mmol/L (ref 135–145)

## 2022-07-26 LAB — CBC WITH DIFFERENTIAL/PLATELET
Abs Immature Granulocytes: 0.04 10*3/uL (ref 0.00–0.07)
Basophils Absolute: 0.1 10*3/uL (ref 0.0–0.1)
Basophils Relative: 1 %
Eosinophils Absolute: 0.3 10*3/uL (ref 0.0–0.5)
Eosinophils Relative: 2 %
HCT: 38 % (ref 36.0–46.0)
Hemoglobin: 13.1 g/dL (ref 12.0–15.0)
Immature Granulocytes: 0 %
Lymphocytes Relative: 31 %
Lymphs Abs: 3.5 10*3/uL (ref 0.7–4.0)
MCH: 29.4 pg (ref 26.0–34.0)
MCHC: 34.5 g/dL (ref 30.0–36.0)
MCV: 85.4 fL (ref 80.0–100.0)
Monocytes Absolute: 0.7 10*3/uL (ref 0.1–1.0)
Monocytes Relative: 6 %
Neutro Abs: 6.9 10*3/uL (ref 1.7–7.7)
Neutrophils Relative %: 60 %
Platelets: 220 10*3/uL (ref 150–400)
RBC: 4.45 MIL/uL (ref 3.87–5.11)
RDW: 12.6 % (ref 11.5–15.5)
WBC: 11.4 10*3/uL — ABNORMAL HIGH (ref 4.0–10.5)
nRBC: 0 % (ref 0.0–0.2)

## 2022-07-26 LAB — RESP PANEL BY RT-PCR (FLU A&B, COVID) ARPGX2
Influenza A by PCR: NEGATIVE
Influenza B by PCR: NEGATIVE
SARS Coronavirus 2 by RT PCR: NEGATIVE

## 2022-07-26 NOTE — ED Provider Triage Note (Signed)
Emergency Medicine Provider Triage Evaluation Note  ANJULI GEMMILL , a 61 y.o. female  was evaluated in triage.  Patient reports of a couple weeks of a cough occasionally productive of phlegm.  Over the past week she has started to have some back pain that radiates around to her flank.  No history of kidney stone.  No urinary symptoms.  No known sick contacts, history of DVT/PE, recent travel or surgery.  Review of Systems  Positive:  Negative:   Physical Exam  LMP 12/20/2014  Gen:   Awake, no distress   Resp:  Normal effort  MSK:   Moves extremities without difficulty  Other:  Negative CVA bilaterally.  Minimal Rales in right lower lung field Medical Decision Making  Medically screening exam initiated at 7:42 PM.  Appropriate orders placed.  STEFANIA GOULART was informed that the remainder of the evaluation will be completed by another provider, this initial triage assessment does not replace that evaluation, and the importance of remaining in the ED until their evaluation is complete.     Rhae Hammock, [place] 07/26/22 1943

## 2022-07-26 NOTE — ED Triage Notes (Signed)
Pt states that she has had a cold for 3 weeks and is now having left sided pain and back pain. Pt reports coughing up yellow mucus.

## 2022-07-27 ENCOUNTER — Other Ambulatory Visit: Payer: Self-pay

## 2022-07-27 LAB — URINALYSIS, ROUTINE W REFLEX MICROSCOPIC
Bilirubin Urine: NEGATIVE
Glucose, UA: >=500 mg/dL — AB
Hgb urine dipstick: NEGATIVE
Ketones, ur: NEGATIVE mg/dL
Leukocytes,Ua: NEGATIVE
Nitrite: NEGATIVE
Protein, ur: NEGATIVE mg/dL
Specific Gravity, Urine: 1.017 (ref 1.005–1.030)
pH: 6 (ref 5.0–8.0)

## 2022-07-27 MED ORDER — HYDROCOD POLI-CHLORPHE POLI ER 10-8 MG/5ML PO SUER
5.0000 mL | Freq: Once | ORAL | Status: AC
  Administered 2022-07-27: 5 mL via ORAL
  Filled 2022-07-27: qty 5

## 2022-07-27 MED ORDER — IBUPROFEN 200 MG PO TABS
600.0000 mg | ORAL_TABLET | Freq: Once | ORAL | Status: AC
  Administered 2022-07-27: 600 mg via ORAL
  Filled 2022-07-27: qty 3

## 2022-07-27 MED ORDER — IBUPROFEN 800 MG PO TABS
800.0000 mg | ORAL_TABLET | Freq: Three times a day (TID) | ORAL | 0 refills | Status: DC | PRN
  Filled 2022-07-27: qty 21, 7d supply, fill #0

## 2022-07-27 MED ORDER — ALBUTEROL SULFATE HFA 108 (90 BASE) MCG/ACT IN AERS
2.0000 | INHALATION_SPRAY | RESPIRATORY_TRACT | Status: DC | PRN
  Administered 2022-07-27: 2 via RESPIRATORY_TRACT
  Filled 2022-07-27: qty 6.7

## 2022-07-27 MED ORDER — DOXYCYCLINE HYCLATE 100 MG PO TABS
100.0000 mg | ORAL_TABLET | Freq: Once | ORAL | Status: AC
  Administered 2022-07-27: 100 mg via ORAL
  Filled 2022-07-27: qty 1

## 2022-07-27 MED ORDER — DOXYCYCLINE HYCLATE 100 MG PO CAPS: 100.0000 mg | ORAL_CAPSULE | Freq: Two times a day (BID) | ORAL | 0 refills | Status: DC

## 2022-07-27 MED ORDER — AEROCHAMBER PLUS FLO-VU MISC
1.0000 | Freq: Once | Status: AC
  Administered 2022-07-27: 1
  Filled 2022-07-27 (×5): qty 1

## 2022-07-27 MED ORDER — DOXYCYCLINE HYCLATE 100 MG PO TABS
100.0000 mg | ORAL_TABLET | Freq: Two times a day (BID) | ORAL | 0 refills | Status: DC
  Filled 2022-07-27: qty 14, 7d supply, fill #0

## 2022-07-27 MED ORDER — HYDROCOD POLI-CHLORPHE POLI ER 10-8 MG/5ML PO SUER
5.0000 mL | Freq: Two times a day (BID) | ORAL | 0 refills | Status: DC | PRN
Start: 2022-07-27 — End: 2022-12-24
  Filled 2022-07-27: qty 70, 7d supply, fill #0

## 2022-07-27 NOTE — ED Provider Notes (Signed)
[facility] DEPT Provider Note: Georgena Spurling, MD, FACEP  CSN: [id number] MRN: [id number] ARRIVAL: 07/26/22 at [place]: [id number]   CHIEF COMPLAINT  Cough and Back Pain   HISTORY OF PRESENT ILLNESS  07/27/22 3:44 AM Anna Golden is a 61 y.o. female who developed a cold (nasal congestion, cough) about 3 weeks ago.  The cough has persisted and she is now coughing up yellow mucus.  She is also having trouble taking a deep breath partly because of pain in her left posterolateral chest wall.  This pain is sharp and worse with breathing, movement and palpation.  She rates it as a 7 out of 10.   Past Medical History:  Diagnosis Date   Acute asthma exacerbation 03/07/2014   Arthritis    Asthma    DKA (diabetic ketoacidoses) 03/07/2014   DM (diabetes mellitus) ([place]) 04/24/2013   Dysmenorrhea 01/23/2013   Fatty liver disease, nonalcoholic    Hypertension    Leiomyoma of uterus, unspecified 01/23/2013   Leukocytosis    Menorrhagia with regular cycle 01/23/2013   Obesity    PAF (paroxysmal atrial fibrillation) ([place])    a. Dx 03/2013, spont converted to NSR.   Sickle cell trait ([place])    Smoking 03/18/2014    Past Surgical History:  Procedure Laterality Date   HERNIA REPAIR      Family History  Problem Relation Age of Onset   Heart disease Mother    Diabetes Mother    Sickle cell anemia Mother    Cancer Father        colon    Social History   Tobacco Use   Smoking status: Every Day    Packs/day: 0.25    Types: Cigarettes   Smokeless tobacco: Never  Vaping Use   Vaping Use: Never used  Substance Use Topics   Alcohol use: No   Drug use: No    Prior to Admission medications   Medication Sig Start Date End Date Taking? Authorizing Provider  aspirin EC 81 MG tablet Take 1 tablet (81 mg total) by mouth daily. Swallow whole. 11/02/20   Elsie Stain, MD  atorvastatin (LIPITOR) 40 MG tablet TAKE 1 TABLET (40 MG TOTAL) BY MOUTH ONCE DAILY. 11/07/21 11/07/22  Elsie Stain, MD  Blood Glucose Monitoring Suppl (TRUE METRIX METER) w/Device KIT Use to measure blood sugar twice a day 08/01/20   Elsie Stain, MD  chlorpheniramine-HYDROcodone (TUSSIONEX) 10-8 MG/5ML Take 5 mLs by mouth every 12 (twelve) hours as needed for cough. 07/27/22  Yes Lourene Hoston, MD  diltiazem (CARDIZEM CD) 120 MG 24 hr capsule TAKE 1 CAPSULE BY MOUTH DAILY. 11/07/21 11/07/22  Elsie Stain, MD  doxycycline (VIBRAMYCIN) 100 MG capsule Take 1 capsule (100 mg total) by mouth 2 (two) times daily. One po bid x 7 days 07/27/22  Yes Joseph Johns, MD  glucose blood (TRUE METRIX BLOOD GLUCOSE TEST) test strip Use as instructed 08/01/20   Elsie Stain, MD  ibuprofen (ADVIL) 800 MG tablet Take 1 tablet (800 mg total) by mouth every 8 (eight) hours as needed (For chest wall pain). 07/27/22  Yes Jazmene Racz, MD  Insulin Glargine (BASAGLAR KWIKPEN) 100 UNIT/ML Inject 45 Units into the skin once daily. 12/12/21   Elsie Stain, MD  Insulin Pen Needle (TRUEPLUS 5-BEVEL PEN NEEDLES) 32G X 4 MM MISC Use to inject insulin once daily. 11/07/21   Elsie Stain, MD  metFORMIN (GLUCOPHAGE-XR) 500 MG 24 hr tablet Take 2 tablets (  1,000 mg total) by mouth 2 (two) times daily. 12/12/21   Elsie Stain, MD  gabapentin (NEURONTIN) 300 MG capsule Take 1 capsule (300 mg total) by mouth 3 (three) times daily. Patient not taking: No sig reported 08/01/20 11/02/20  Elsie Stain, MD  insulin aspart protamine- aspart (NOVOLOG MIX 70/30) (70-30) 100 UNIT/ML injection Inject 0.08 mLs (8 Units total) into the skin 2 (two) times daily with a meal. Patient not taking: Reported on 01/15/2015 04/26/14 01/26/15  Lorayne Marek, MD  insulin lispro (HUMALOG) 100 UNIT/ML KwikPen 5 units twice a day with two largest meals of the day Patient not taking: Reported on 11/02/2020 08/01/20 11/02/20  Elsie Stain, MD  potassium chloride (KLOR-CON) 10 MEQ tablet Take 1 tablet (10 mEq total) by mouth daily for 21 days. Patient  not taking: Reported on 11/02/2020 08/01/20 11/02/20  Elsie Stain, MD    Allergies Metoprolol, Other, and Latex   REVIEW OF SYSTEMS  Negative except as noted here or in the History of Present Illness.   PHYSICAL EXAMINATION  Initial Vital Signs Blood pressure 135/74, pulse 96, temperature 98.8 F (37.1 C), temperature source Oral, resp. rate 16, height _0  (1.6 m), weight 81.6 kg, last menstrual period 12/20/2014, SpO2 100 %.  Examination General: Well-developed, well-nourished female in no acute distress; appearance consistent with age of record HENT: normocephalic; atraumatic Eyes: Normal appearance Neck: supple Heart: regular rate and rhythm Lungs: clear to auscultation bilaterally; shallow breaths with pain on breathing Chest: posterolateral rib tenderness Abdomen: soft; nondistended; nontender; bowel sounds present Extremities: No deformity; full range of motion; pulses normal Neurologic: Awake, alert and oriented; motor function intact in all extremities and symmetric; no facial droop Skin: Warm and dry Psychiatric: Normal mood and affect   RESULTS  Summary of this visit's results, reviewed and interpreted by myself:   EKG Interpretation  Date/Time:    Ventricular Rate:    PR Interval:    QRS Duration:   QT Interval:    QTC Calculation:   R Axis:     Text Interpretation:         Laboratory Studies: Results for orders placed or performed during the hospital encounter of 07/26/22 (from the past 24 hour(s))  Basic metabolic panel     Status: Abnormal   Collection Time: 07/26/22  8:10 PM  Result Value Ref Range   Sodium 139 135 - 145 mmol/L   Potassium 3.5 3.5 - 5.1 mmol/L   Chloride 106 98 - 111 mmol/L   CO2 26 22 - 32 mmol/L   Glucose, Bld 350 (H) 70 - 99 mg/dL   BUN 15 6 - 20 mg/dL   Creatinine, Ser 0.79 0.44 - 1.00 mg/dL   Calcium 8.8 (L) 8.9 - 10.3 mg/dL   GFR, Estimated >60 >60 mL/min   Anion gap 7 5 - 15  CBC with Differential     Status:  Abnormal   Collection Time: 07/26/22  8:10 PM  Result Value Ref Range   WBC 11.4 (H) 4.0 - 10.5 K/uL   RBC 4.45 3.87 - 5.11 MIL/uL   Hemoglobin 13.1 12.0 - 15.0 g/dL   HCT 38.0 36.0 - 46.0 %   MCV 85.4 80.0 - 100.0 fL   MCH 29.4 26.0 - 34.0 pg   MCHC 34.5 30.0 - 36.0 g/dL   RDW 12.6 11.5 - 15.5 %   Platelets 220 150 - 400 K/uL   nRBC 0.0 0.0 - 0.2 %   Neutrophils Relative % 60 %  Neutro Abs 6.9 1.7 - 7.7 K/uL   Lymphocytes Relative 31 %   Lymphs Abs 3.5 0.7 - 4.0 K/uL   Monocytes Relative 6 %   Monocytes Absolute 0.7 0.1 - 1.0 K/uL   Eosinophils Relative 2 %   Eosinophils Absolute 0.3 0.0 - 0.5 K/uL   Basophils Relative 1 %   Basophils Absolute 0.1 0.0 - 0.1 K/uL   Immature Granulocytes 0 %   Abs Immature Granulocytes 0.04 0.00 - 0.07 K/uL  Resp Panel by RT-PCR (Flu A&B, Covid) Anterior Nasal Swab     Status: None   Collection Time: 07/26/22  8:10 PM   Specimen: Anterior Nasal Swab  Result Value Ref Range   SARS Coronavirus 2 by RT PCR NEGATIVE NEGATIVE   Influenza A by PCR NEGATIVE NEGATIVE   Influenza B by PCR NEGATIVE NEGATIVE   Imaging Studies: DG Chest 2 View  Result Date: 07/26/2022 CLINICAL DATA:  Shortness of breath EXAM: CHEST - 2 VIEW COMPARISON:  03/14/2022 FINDINGS: The heart size and mediastinal contours are within normal limits. Both lungs are clear. The visualized skeletal structures are unremarkable. IMPRESSION: No active cardiopulmonary disease. Electronically Signed   By: Inez Catalina M.D.   On: 07/26/2022 20:06    ED COURSE and MDM  Nursing notes, initial and subsequent vitals signs, including pulse oximetry, reviewed and interpreted by myself.  Vitals:   07/26/22 1941 07/26/22 1943 07/27/22 0351  BP: 135/74  121/78  Pulse: 96  78  Resp: 16  16  Temp: 98.8 F (37.1 C)  97.8 F (36.6 C)  TempSrc: Oral  Oral  SpO2: 100%  100%  Weight:  81.6 kg   Height:  _0  (1.6 m)    Medications  albuterol (VENTOLIN HFA) 108 (90 Base) MCG/ACT inhaler 2  puff (has no administration in time range)  aerochamber plus with mask device 1 each (has no administration in time range)  ibuprofen (ADVIL) tablet 600 mg (has no administration in time range)  chlorpheniramine-HYDROcodone (TUSSIONEX) 10-8 MG/5ML suspension 5 mL (has no administration in time range)  doxycycline (VIBRA-TABS) tablet 100 mg (has no administration in time range)    The patient's pain is less likely due to chest wall inflammation.  There may be a pleuritic component as well as a musculoskeletal component.  We will treat with an NSAID.  Because she has persistent productive cough I think it is reasonable to treat her for a nascent bacterial bronchitis.  We will also treat her with Tussionex which may also help benefit her pain.  We will provide her an albuterol inhaler and AeroChamber and instruct her in their use.  She has used an inhaler in the past but does not currently have 1.  PROCEDURES  Procedures   ED DIAGNOSES     ICD-10-CM   1. Acute bronchitis with bronchospasm  J20.9     2. Chest wall pain  R07.89          Shanon Rosser, MD 07/27/22 [phone]

## 2022-08-01 ENCOUNTER — Ambulatory Visit: Payer: Self-pay | Admitting: *Deleted

## 2022-08-01 ENCOUNTER — Other Ambulatory Visit: Payer: Self-pay

## 2022-08-01 ENCOUNTER — Emergency Department (HOSPITAL_COMMUNITY)
Admission: EM | Admit: 2022-08-01 | Discharge: 2022-08-02 | Discharge status: Against Medical Advice | Payer: Medicaid Other | Attending: Emergency Medicine | Admitting: Emergency Medicine

## 2022-08-01 ENCOUNTER — Emergency Department (HOSPITAL_COMMUNITY): Payer: Medicaid Other

## 2022-08-01 DIAGNOSIS — R1032 Left lower quadrant pain: Secondary | ICD-10-CM | POA: Insufficient documentation

## 2022-08-01 DIAGNOSIS — Z5321 Procedure and treatment not carried out due to patient leaving prior to being seen by health care provider: Secondary | ICD-10-CM | POA: Diagnosis not present

## 2022-08-01 DIAGNOSIS — J209 Acute bronchitis, unspecified: Secondary | ICD-10-CM | POA: Diagnosis not present

## 2022-08-01 LAB — COMPREHENSIVE METABOLIC PANEL
ALT: 19 U/L (ref 0–44)
AST: 20 U/L (ref 15–41)
Albumin: 3.4 g/dL — ABNORMAL LOW (ref 3.5–5.0)
Alkaline Phosphatase: 44 U/L (ref 38–126)
Anion gap: 11 (ref 5–15)
BUN: 5 mg/dL — ABNORMAL LOW (ref 6–20)
CO2: 24 mmol/L (ref 22–32)
Calcium: 9.2 mg/dL (ref 8.9–10.3)
Chloride: 103 mmol/L (ref 98–111)
Creatinine, Ser: 0.7 mg/dL (ref 0.44–1.00)
GFR, Estimated: >60 mL/min (ref >60)
Glucose, Bld: 330 mg/dL — ABNORMAL HIGH (ref 70–99)
Potassium: 3.5 mmol/L (ref 3.5–5.1)
Sodium: 138 mmol/L (ref 135–145)
Total Bilirubin: 0.4 mg/dL (ref 0.3–1.2)
Total Protein: 6.8 g/dL (ref 6.5–8.1)

## 2022-08-01 LAB — CBC
HCT: 37.5 % (ref 36.0–46.0)
Hemoglobin: 12.9 g/dL (ref 12.0–15.0)
MCH: 29.9 pg (ref 26.0–34.0)
MCHC: 34.4 g/dL (ref 30.0–36.0)
MCV: 87 fL (ref 80.0–100.0)
Platelets: 221 10*3/uL (ref 150–400)
RBC: 4.31 MIL/uL (ref 3.87–5.11)
RDW: 12.8 % (ref 11.5–15.5)
WBC: 10.2 10*3/uL (ref 4.0–10.5)
nRBC: 0 % (ref 0.0–0.2)

## 2022-08-01 LAB — LIPASE, BLOOD: Lipase: 33 U/L (ref 11–51)

## 2022-08-01 MED ORDER — IOHEXOL 350 MG/ML SOLN
75.0000 mL | Freq: Once | INTRAVENOUS | Status: AC | PRN
  Administered 2022-08-01: 75 mL via INTRAVENOUS

## 2022-08-01 NOTE — ED Provider Triage Note (Signed)
Emergency Medicine Provider Triage Evaluation Note  Anna Golden , a 61 y.o. female  was evaluated in triage.  Pt complains of flank pain. Pain to the L side of her flank/abdomen x 2 weeks, worse when she lays on the affected side.  No fever, chills.  Did have some cold sxs recently.  Seen in the ER a week ago for same, and was diagnosed with bronchitis.  Gave abx but developed allergy to it.  Still having pain  Review of Systems  Positive: As above Negative: As above  Physical Exam  BP 127/75 (BP Location: Right Arm)   Pulse 77   Temp 98.3 F (36.8 C)   Resp 18   LMP 12/20/2014   SpO2 98%  Gen:   Awake, no distress   Resp:  Normal effort  MSK:   Moves extremities without difficulty  Other:    Medical Decision Making  Medically screening exam initiated at 7:26 PM.  Appropriate orders placed.  Anna Golden was informed that the remainder of the evaluation will be completed by another provider, this initial triage assessment does not replace that evaluation, and the importance of remaining in the ED until their evaluation is complete.     Domenic Moras, PA-C 08/01/22 1931

## 2022-08-01 NOTE — ED Triage Notes (Signed)
Patient reports left lateral abdominal pain radiating to lower back onset last week , denies emesis or diarrhea , no fever or chills .

## 2022-08-01 NOTE — Telephone Encounter (Signed)
  Chief Complaint: left side pain , left chest, breast area  Symptoms: worsening left chest breast area. Reports constant severe pain with mobility and reports can hardly move or walk. Has been taking antibiotics and reports noted eye swelling . Difficulty sleeping and can not sleep on left side  Frequency: on going since 07/26/22 but now worsening pain  Pertinent Negatives: Patient denies chest pain no difficulty breathing. No fever reported no N/V Disposition: '[x]'$ ED /'[]'$ Urgent Care (no appt availability in office) / '[]'$ Appointment(In office/virtual)/ '[]'$  [facility]/ '[]'$ Home Care/ '[]'$ Refused Recommended Disposition /'[]'$ [facility]/ '[]'$  Follow-up with PCP Additional Notes:   Recommended ED due to worsen sx.    Reason for Disposition  [1] SEVERE pain (e.g., excruciating, scale 8-10) AND [2] not improved after pain medicine  Answer Assessment - Initial Assessment Questions 1. LOCATION: "Where does it hurt?" (e.g., left, right)     Under left breast area and above left breast left side  2. ONSET: "When did the pain start?"     Since 07/26/22 and now constant pain 3. SEVERITY: "How bad is the pain?" (e.g., Scale 1-10; mild, moderate, or severe)   - MILD (1-3): doesn't interfere with normal activities    - MODERATE (4-7): interferes with normal activities or awakens from sleep    - SEVERE (8-10): excruciating pain and patient unable to do normal activities (stays in bed)       Severe at level 10 pain with walking or any movement  4. PATTERN: "Does the pain come and go, or is it constant?"      Constant  5. CAUSE: "What do you think is causing the pain?"     Last seen in ED 07/26/22 for same issue 6. OTHER SYMPTOMS:  "Do you have any other symptoms?" (e.g., fever, abdomen pain, vomiting, leg weakness, burning with urination, blood in urine)     Left flank pain left chest / breast area, taking antibiotics and reported eye swelling noted  7. PREGNANCY:  "Is there any chance you  are pregnant?" "When was your last menstrual period?"     na  Protocols used: Flank Pain-A-AH

## 2022-08-02 NOTE — ED Notes (Signed)
Patient stated that the wait time is too long and that she is leaving. This RN advised the patient to stay and be seen by one of our EDP, however patient stated she was leaving.

## 2022-08-03 NOTE — Telephone Encounter (Signed)
Pt was seen in the ED.  FYI

## 2022-10-01 ENCOUNTER — Ambulatory Visit: Payer: Medicaid Other

## 2022-10-03 ENCOUNTER — Ambulatory Visit: Payer: Medicaid Other | Admitting: Internal Medicine

## 2022-10-23 ENCOUNTER — Ambulatory Visit: Payer: Self-pay | Admitting: Internal Medicine

## 2022-11-05 NOTE — Progress Notes (Deleted)
Established Patient Office Visit  Subjective:  Patient ID: Anna Golden, female    DOB: 27-Aug-1961  Age: 62 y.o. MRN: [id number]  CC: Primary care to reestablish  HPI 10/2021 Anna Golden presents for primary care to reestablish with prior history of hypertension and type 2 diabetes.  This patient has difficulty with using needles for insulin and been trying to manage her orally.  We tried on Farxiga but she did not tolerate this well.  She is only been taking metformin and she misses many doses.  Have not seen her since March 2022.  She is due a variety of primary care screenings  On arrival blood sugar 326 blood pressure 133/68.  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  She does not follow a healthy diet.  She fell February 6 because of weakness and was found to be volume depleted.  She was given a liter of saline had a series of x-rays which were negative.  She still has some soreness in the right hip and in the shoulders.  As stated she cannot tolerate Wilder Glade because of side effects.  She is complaining of right upper quadrant abdominal pain and thyromegaly.  She is still smoking a pack every 2 days of cigarettes.  She does not drink alcohol.  Patient also complains of bilateral wrist pain and weakness in the hands.  She also has frequent falls at home as well.  She states she has difficulty walking because of neuropathy in the feet.  The patient has a cane at home but she rarely uses this.  Patient's had physical therapy when she lived in [place] but she declines to receive this again.  She does not have any insurance she is working on [employer].  11/06/22  Past Medical History:  Diagnosis Date   Acute asthma exacerbation 03/07/2014   Arthritis    Asthma    DKA (diabetic ketoacidoses) 03/07/2014   DM (diabetes mellitus) ([place]) 04/24/2013   Dysmenorrhea 01/23/2013   Fatty liver disease, nonalcoholic    Hypertension    Leiomyoma of uterus, unspecified  01/23/2013   Leukocytosis    Menorrhagia with regular cycle 01/23/2013   Obesity    PAF (paroxysmal atrial fibrillation) ([place])    a. Dx 03/2013, spont converted to NSR.   Sickle cell trait ([place])    Smoking 03/18/2014    Past Surgical History:  Procedure Laterality Date   HERNIA REPAIR      Family History  Problem Relation Age of Onset   Heart disease Mother    Diabetes Mother    Sickle cell anemia Mother    Cancer Father        colon    Social History   Socioeconomic History   Marital status: Legally Separated    Spouse name: Not on file   Number of children: Not on file   Years of education: Not on file   Highest education level: Not on file  Occupational History   Not on file  Tobacco Use   Smoking status: Every Day    Packs/day: 0.25    Types: Cigarettes   Smokeless tobacco: Never  Vaping Use   Vaping Use: Never used  Substance and Sexual Activity   Alcohol use: No   Drug use: No   Sexual activity: Yes  Other Topics Concern   Not on file  Social History Narrative   Not on file   Social Determinants of Health   Financial Resource  Strain: Not on file  Food Insecurity: Not on file  Transportation Needs: Not on file  Physical Activity: Not on file  Stress: Not on file  Social Connections: Not on file  Intimate Partner Violence: Not on file    Outpatient Medications Prior to Visit  Medication Sig Dispense Refill   aspirin EC 81 MG tablet Take 1 tablet (81 mg total) by mouth daily. Swallow whole. 100 tablet 1   atorvastatin (LIPITOR) 40 MG tablet TAKE 1 TABLET (40 MG TOTAL) BY MOUTH ONCE DAILY. 90 tablet 1   Blood Glucose Monitoring Suppl (TRUE METRIX METER) w/Device KIT Use to measure blood sugar twice a day 1 kit 0   chlorpheniramine-HYDROcodone (TUSSIONEX) 10-8 MG/5ML Take 5 mLs by mouth every 12 (twelve) hours as needed for cough. 70 mL 0   diltiazem (CARDIZEM CD) 120 MG 24 hr capsule TAKE 1 CAPSULE BY MOUTH DAILY. 60 capsule 2   doxycycline  (VIBRA-TABS) 100 MG tablet Take 1 tablet (100 mg total) by mouth 2 (two) times daily. 14 tablet 0   glucose blood (TRUE METRIX BLOOD GLUCOSE TEST) test strip Use as instructed 100 each 12   ibuprofen (ADVIL) 800 MG tablet Take 1 tablet (800 mg total) by mouth every 8 (eight) hours as needed (For chest wall pain). 21 tablet 0   Insulin Glargine (BASAGLAR KWIKPEN) 100 UNIT/ML Inject 45 Units into the skin once daily. 15 mL 2   Insulin Pen Needle (TRUEPLUS 5-BEVEL PEN NEEDLES) 32G X 4 MM MISC Use to inject insulin once daily. 100 each 2   metFORMIN (GLUCOPHAGE-XR) 500 MG 24 hr tablet Take 2 tablets (1,000 mg total) by mouth 2 (two) times daily. 120 tablet 2   No facility-administered medications prior to visit.    Allergies  Allergen Reactions   Metoprolol Other (See Comments)    "Patient slept for whole day after starting medication this week.  Patient does not want to take this med.  Not sure if med causes hypotension, but could not get out of bed for any reason"   Other Other (See Comments)    ANESTHESIA-CAUSES [place], 2008.     Latex Itching    Swell up    ROS Review of Systems  Constitutional:  Positive for fatigue.  HENT: Negative.  Negative for ear pain, postnasal drip, rhinorrhea, sinus pressure, sore throat, trouble swallowing and voice change.   Eyes: Negative.   Respiratory: Negative.  Negative for apnea, cough, choking, chest tightness, shortness of breath, wheezing and stridor.   Cardiovascular: Negative.  Negative for chest pain, palpitations and leg swelling.  Gastrointestinal:  Positive for abdominal distention and abdominal pain. Negative for anal bleeding, blood in stool, constipation, diarrhea, nausea and vomiting.  Endocrine: Positive for polydipsia, polyphagia and polyuria.  Genitourinary:  Positive for enuresis.  Musculoskeletal:  Positive for gait problem. Negative for arthralgias and myalgias.  Skin: Negative.  Negative for rash.   Allergic/Immunologic: Negative.  Negative for environmental allergies and food allergies.  Neurological:  Positive for syncope, weakness and numbness. Negative for dizziness, tremors, seizures, facial asymmetry, speech difficulty, light-headedness and headaches.  Hematological: Negative.  Negative for adenopathy. Does not bruise/bleed easily.  Psychiatric/Behavioral: Negative.  Negative for agitation and sleep disturbance. The patient is not nervous/anxious.       Objective:    Physical Exam Vitals reviewed.  Constitutional:      Appearance: Normal appearance. She is well-developed. She is obese. She is not diaphoretic.  HENT:  Head: Normocephalic and atraumatic.     Nose: No nasal deformity, septal deviation, mucosal edema or rhinorrhea.     Right Sinus: No maxillary sinus tenderness or frontal sinus tenderness.     Left Sinus: No maxillary sinus tenderness or frontal sinus tenderness.     Mouth/Throat:     Pharynx: No oropharyngeal exudate.  Eyes:     General: No scleral icterus.    Conjunctiva/sclera: Conjunctivae normal.     Pupils: Pupils are equal, round, and reactive to light.  Neck:     Thyroid: Thyromegaly present.     Vascular: No carotid bruit or JVD.     Trachea: Trachea normal. No tracheal tenderness or tracheal deviation.  Cardiovascular:     Rate and Rhythm: Normal rate and regular rhythm.     Chest Wall: PMI is not displaced.     Pulses: Normal pulses. No decreased pulses.     Heart sounds: Normal heart sounds, S1 normal and S2 normal. Heart sounds not distant. No murmur heard.    No systolic murmur is present.     No diastolic murmur is present.     No friction rub. No gallop. No S3 or S4 sounds.  Pulmonary:     Effort: No tachypnea, accessory muscle usage or respiratory distress.     Breath sounds: No stridor. No decreased breath sounds, wheezing, rhonchi or rales.  Chest:     Chest wall: No tenderness.  Abdominal:     General: Bowel sounds are normal.  There is no distension.     Palpations: Abdomen is soft. Abdomen is not rigid.     Tenderness: There is no abdominal tenderness. There is no guarding or rebound.  Musculoskeletal:        General: Normal range of motion.     Cervical back: Normal range of motion and neck supple. No edema, erythema or rigidity. No muscular tenderness. Normal range of motion.  Lymphadenopathy:     Head:     Right side of head: No submental or submandibular adenopathy.     Left side of head: No submental or submandibular adenopathy.     Cervical: No cervical adenopathy.  Skin:    General: Skin is warm and dry.     Coloration: Skin is not pale.     Findings: No rash.     Nails: There is no clubbing.  Neurological:     Mental Status: She is alert and oriented to person, place, and time.     Cranial Nerves: Cranial nerves 2-12 are intact.     Sensory: No sensory deficit.     Motor: Motor function is intact.     Coordination: Coordination abnormal.     Comments: Weakness in both hands  Psychiatric:        Speech: Speech normal.        Behavior: Behavior normal.     LMP 12/20/2014  Wt Readings from Last 3 Encounters:  07/26/22 180 lb (81.6 kg)  03/14/22 189 lb (85.7 kg)  11/07/21 164 lb 12.8 oz (74.8 kg)     Health Maintenance Due  Topic Date Due   OPHTHALMOLOGY EXAM  Never done   DTaP/Tdap/Td (1 - Tdap) Never done   MAMMOGRAM  Never done   Zoster Vaccines- Shingrix (1 of 2) Never done   PAP SMEAR-Modifier  01/24/2016   Diabetic kidney evaluation - Urine ACR  08/01/2021   FOOT EXAM  08/01/2021   COLON CANCER SCREENING ANNUAL FOBT  08/01/2021   INFLUENZA  VACCINE  03/27/2022   COVID-19 Vaccine (3 - 2023-24 season) 04/27/2022   HEMOGLOBIN A1C  05/10/2022    There are no preventive care reminders to display for this patient.  Lab Results  Component Value Date   TSH 1.640 11/07/2021   Lab Results  Component Value Date   WBC 10.2 08/01/2022   HGB 12.9 08/01/2022   HCT 37.5 08/01/2022    MCV 87.0 08/01/2022   PLT 221 08/01/2022   Lab Results  Component Value Date   NA 138 08/01/2022   K 3.5 08/01/2022   CO2 24 08/01/2022   GLUCOSE 330 (H) 08/01/2022   BUN 5 (L) 08/01/2022   CREATININE 0.70 08/01/2022   BILITOT 0.4 08/01/2022   ALKPHOS 44 08/01/2022   AST 20 08/01/2022   ALT 19 08/01/2022   PROT 6.8 08/01/2022   ALBUMIN 3.4 (L) 08/01/2022   CALCIUM 9.2 08/01/2022   ANIONGAP 11 08/01/2022   EGFR 92 11/07/2021   Lab Results  Component Value Date   CHOL 159 11/07/2021   Lab Results  Component Value Date   HDL 59 11/07/2021   Lab Results  Component Value Date   LDLCALC 77 11/07/2021   Lab Results  Component Value Date   TRIG 129 11/07/2021   Lab Results  Component Value Date   CHOLHDL 2.7 11/07/2021   Lab Results  Component Value Date   HGBA1C 14.0 (H) 11/07/2021      Assessment & Plan:   Problem List Items Addressed This Visit   None  No orders of the defined types were placed in this encounter. 38 minutes spent complex decision making is high Mammogram is ordered and fecal occult kit issued for colon cancer screening Follow-up: No follow-ups on file.    Asencion Noble, MD

## 2022-11-06 ENCOUNTER — Ambulatory Visit: Payer: Medicaid Other | Admitting: Critical Care Medicine

## 2022-11-19 ENCOUNTER — Ambulatory Visit: Payer: Medicaid Other | Attending: Internal Medicine | Admitting: Internal Medicine

## 2022-11-19 NOTE — Progress Notes (Deleted)
Cardiology Office Note:    Date:  11/19/2022   ID:  Tava Mohs, DOB 1961-03-11, MRN [id number]  PCP:  Elsie Stain, MD   [place] Providers Cardiologist:  None { Click to update primary MD,subspecialty MD or APP then REFRESH:1}    Referring MD: Elsie Stain, MD   No chief complaint on file. ***  History of Present Illness:    Anna Golden is a 62 y.o. female with a hx of HTN, and DM with prior DKA. Former Smoker ***, HLD, with prior hx of AF (isolated episode 03/2013 with self conversion and no further events) who presents with CP. Distant patient of Dr. Johnsie Cancel. Has aortic atherosclerosis and femoral calcifications without over PAD.  Patient notes that she is feeling ***.   Was last feeling well ***. Able to ***  Has had no chest pain, chest pressure, chest tightness, chest stinging ***.  Discomfort occurs with ***, worsens with ***, and improves with ***.    Patient exertion notable for *** with *** and feels no symptoms.    No shortness of breath, DOE ***.  No PND or orthopnea***.  No weight gain***, leg swelling ***, or abdominal swelling***.  No syncope or near syncope ***. Notes *** no palpitations or funny heart beats.     Patient reports prior cardiac testing including ***  No history of ***pre-eclampsia, gestation HTN or gestational DM.  No Fen-Phen or drug use***.  Ambulatory BP ***.   Past Medical History:  Diagnosis Date   Acute asthma exacerbation 03/07/2014   Arthritis    Asthma    DKA (diabetic ketoacidoses) 03/07/2014   DM (diabetes mellitus) ([place]) 04/24/2013   Dysmenorrhea 01/23/2013   Fatty liver disease, nonalcoholic    Hypertension    Leiomyoma of uterus, unspecified 01/23/2013   Leukocytosis    Menorrhagia with regular cycle 01/23/2013   Obesity    PAF (paroxysmal atrial fibrillation) ([place])    a. Dx 03/2013, spont converted to NSR.   Sickle cell trait ([place])    Smoking 03/18/2014    Past Surgical History:  Procedure  Laterality Date   HERNIA REPAIR      Current Medications: No outpatient medications have been marked as taking for the 11/19/22 encounter (Appointment) with Werner Lean, MD.     Allergies:   Metoprolol, Other, and Latex   Social History   Socioeconomic History   Marital status: Legally Separated    Spouse name: Not on file   Number of children: Not on file   Years of education: Not on file   Highest education level: Not on file  Occupational History   Not on file  Tobacco Use   Smoking status: Every Day    Packs/day: .25    Types: Cigarettes   Smokeless tobacco: Never  Vaping Use   Vaping Use: Never used  Substance and Sexual Activity   Alcohol use: No   Drug use: No   Sexual activity: Yes  Other Topics Concern   Not on file  Social History Narrative   Not on file   Social Determinants of Health   Financial Resource Strain: Not on file  Food Insecurity: Not on file  Transportation Needs: Not on file  Physical Activity: Not on file  Stress: Not on file  Social Connections: Not on file     Family History: The patient's ***family history includes Cancer in her father; Diabetes in her mother; Heart disease in her mother; Sickle cell anemia in her  mother.  ROS:   Please see the history of present illness.    *** All other systems reviewed and are negative.  EKGs/Labs/Other Studies Reviewed:    The following studies were reviewed today: ***  EKG:  EKG is *** ordered today.  The ekg ordered today demonstrates *** 11/19/22: ***   Recent Labs: 08/01/2022: ALT 19; BUN 5; Creatinine, Ser 0.70; Hemoglobin 12.9; Platelets 221; Potassium 3.5; Sodium 138  Recent Lipid Panel    Component Value Date/Time   CHOL 159 11/07/2021 1206   TRIG 129 11/07/2021 1206   HDL 59 11/07/2021 1206   CHOLHDL 2.7 11/07/2021 1206   CHOLHDL 2.5 04/08/2014 1059   VLDL 18 04/08/2014 1059   LDLCALC 77 11/07/2021 1206     Risk Assessment/Calculations:   {Does this  patient have ATRIAL FIBRILLATION?[phone]}  No BP recorded.  {Refresh Note OR Click here to enter BP  :1}***         Physical Exam:    VS:  LMP 12/20/2014     Wt Readings from Last 3 Encounters:  07/26/22 180 lb (81.6 kg)  03/14/22 189 lb (85.7 kg)  11/07/21 164 lb 12.8 oz (74.8 kg)     GEN: *** Well nourished, well developed in no acute distress HEENT: Normal NECK: No JVD; No carotid bruits LYMPHATICS: No lymphadenopathy CARDIAC: ***RRR, no murmurs, rubs, gallops RESPIRATORY:  Clear to auscultation without rales, wheezing or rhonchi  ABDOMEN: Soft, non-tender, non-distended MUSCULOSKELETAL:  No edema; No deformity  SKIN: Warm and dry NEUROLOGIC:  Alert and oriented x 3 PSYCHIATRIC:  Normal affect   ASSESSMENT:    No diagnosis found. PLAN:    Precordial Pain - The patient presents with cardiac/possibly cardiac/non-cardiac pain *** - *** Criteria to defer EKG  stress include without evidence of accessory pathway, ventricular pacing, digoxin use, LBBB, or baseline ST changes.  The 10-year ASCVD risk score (Arnett DK, et al., 2019) is: 16%   Values used to calculate the score:     Age: 74 years     Sex: Female     Is Non-Hispanic African American: Yes     Diabetic: Yes     Tobacco smoker: Yes     Systolic Blood Pressure: [id number]     Is BP treated: No     HDL Cholesterol: 59 mg/dL     Total Cholesterol: 159 mg/dL  - Additional Blood Work:  Lipids ***  - *** ASA 81 mg QD, current statin, and beta blocker therapies ***  - Sublingual nitroglycerin as need for chest pain. *** - Would recommend an echocardiogram to assess LVEF and exclude WMA.   - Would recommend CCTA with possible FFR as needed to exclude obstructive CAD and to assess for non-obstructive CAD requiring secondary prevention - BMI in *** so high tube current may be necessary, *** no hx of AF ivabradine - resting heart rate is ***50-60 bpm, given BP room with ddd Metoprolol 25 mg PO 90 min prior to  scan - resting heart rate is ***60-65 bpm, given BP room with add Metoprolol  50 mg PO 90 min prior to scan - resting heart rate is ***65-80 bpm, given BP room with add Metoprolol  100 mg PO 90 min prior to scan - - resting heart rate is ***> 80bpm, given BP room with  add ivabradine 15 mg PO 120 min prior to scan  - GFR is *** necessitating contrast limit of ***  - Would recommend exercise/pharmacological ***nuclear medicine stress test  -  PET study - (NPO at midnight/hold beta blocker in AM); discussed risks, benefits, and alternatives of the diagnostic procedure including chest pain, arrhythmia, and death.  Patient amenable for testing.       {Are you ordering a CV Procedure (e.g. stress test, cath, DCCV, TEE, etc)?   Press F2        :[id number]    Medication Adjustments/Labs and Tests Ordered: Current medicines are reviewed at length with the patient today.  Concerns regarding medicines are outlined above.  No orders of the defined types were placed in this encounter.  No orders of the defined types were placed in this encounter.   There are no Patient Instructions on file for this visit.   Signed, Werner Lean, MD  11/19/2022 8:10 AM    [place]

## 2022-12-24 ENCOUNTER — Other Ambulatory Visit (HOSPITAL_COMMUNITY): Payer: Self-pay

## 2022-12-24 ENCOUNTER — Other Ambulatory Visit: Payer: Self-pay | Admitting: Pharmacist

## 2022-12-24 ENCOUNTER — Other Ambulatory Visit: Payer: Self-pay

## 2022-12-24 ENCOUNTER — Ambulatory Visit: Payer: Medicaid Other | Attending: Critical Care Medicine | Admitting: Pharmacist

## 2022-12-24 DIAGNOSIS — Z79899 Other long term (current) drug therapy: Secondary | ICD-10-CM

## 2022-12-24 DIAGNOSIS — E1142 Type 2 diabetes mellitus with diabetic polyneuropathy: Secondary | ICD-10-CM

## 2022-12-24 DIAGNOSIS — E782 Mixed hyperlipidemia: Secondary | ICD-10-CM

## 2022-12-24 DIAGNOSIS — E1165 Type 2 diabetes mellitus with hyperglycemia: Secondary | ICD-10-CM

## 2022-12-24 MED ORDER — LANTUS SOLOSTAR 100 UNIT/ML ~~LOC~~ SOPN
45.0000 [IU] | PEN_INJECTOR | Freq: Every day | SUBCUTANEOUS | 0 refills | Status: DC
  Filled 2022-12-24: qty 30, 66d supply, fill #0

## 2022-12-24 MED ORDER — TRUE METRIX BLOOD GLUCOSE TEST VI STRP
ORAL_STRIP | 0 refills | Status: DC
  Filled 2022-12-24: qty 50, 25d supply, fill #0

## 2022-12-24 MED ORDER — BASAGLAR KWIKPEN 100 UNIT/ML ~~LOC~~ SOPN
45.0000 [IU] | PEN_INJECTOR | Freq: Every day | SUBCUTANEOUS | 0 refills | Status: DC
  Filled 2022-12-24: qty 12, 26d supply, fill #0

## 2022-12-24 MED ORDER — METFORMIN HCL ER 500 MG PO TB24
1000.0000 mg | ORAL_TABLET | Freq: Two times a day (BID) | ORAL | 0 refills | Status: DC
  Filled 2022-12-24: qty 360, 90d supply, fill #0

## 2022-12-24 MED ORDER — DILTIAZEM HCL ER COATED BEADS 120 MG PO CP24
120.0000 mg | ORAL_CAPSULE | Freq: Every day | ORAL | 0 refills | Status: DC
  Filled 2022-12-24: qty 90, 90d supply, fill #0

## 2022-12-24 MED ORDER — ACCU-CHEK GUIDE W/DEVICE KIT
PACK | 0 refills | Status: AC
  Filled 2022-12-24: qty 1, 30d supply, fill #0

## 2022-12-24 MED ORDER — ATORVASTATIN CALCIUM 40 MG PO TABS
40.0000 mg | ORAL_TABLET | Freq: Every day | ORAL | 0 refills | Status: DC
  Filled 2022-12-24: qty 90, 90d supply, fill #0

## 2022-12-24 MED ORDER — ACCU-CHEK SOFTCLIX LANCETS MISC
2 refills | Status: DC
  Filled 2022-12-24: qty 100, 30d supply, fill #0

## 2022-12-24 MED ORDER — TRUEPLUS 5-BEVEL PEN NEEDLES 32G X 4 MM MISC
0 refills | Status: DC
  Filled 2022-12-24: qty 100, 34d supply, fill #0

## 2022-12-24 MED ORDER — ALBUTEROL SULFATE HFA 108 (90 BASE) MCG/ACT IN AERS
2.0000 | INHALATION_SPRAY | Freq: Four times a day (QID) | RESPIRATORY_TRACT | 0 refills | Status: DC | PRN
  Filled 2022-12-24: qty 18, 25d supply, fill #0

## 2022-12-24 NOTE — Progress Notes (Signed)
12/24/2022 Name: Anna Golden MRN: [id number] DOB: [birth year]  No chief complaint on file.   Anna Golden is a 62 y.o. year old female who was referred for medication management by their primary care provider, Storm Frisk, MD. They presented for a face to face visit today.   They were referred to the pharmacist by a quality report for assistance in managing diabetes   Patient is participating in a Managed Medicaid Plan:  Yes  Subjective:  Care Team: Primary Care Provider: Storm Frisk, MD ; Next Scheduled Visit: re-establishing with [facility] 02/13/2023.  Medication Access/Adherence  Current Pharmacy:  [facility] [address], [address] [place] [address] Phone: [phone] Fax: [phone]  Patient reports affordability concerns with their medications: No  Patient reports access/transportation concerns to their pharmacy: No  Patient reports adherence concerns with their medications:  No     Diabetes:  Current medications:  Medications tried in the past:   Current glucose readings: not checking. Does not have a meter at home.   Patient denies hypoglycemic s/sx including dizziness, shakiness, sweating. Patient reports hyperglycemic symptoms including polyuria, polydipsia, polyphagia, neuropathy, blurred vision.  Current meal patterns:  - Admits to non-adherence to a diabetic diet.   Current physical activity: none  Current medication access support: uses MM and our pharmacy here at the [facility]   Objective:  Lab Results  Component Value Date   HGBA1C 14.0 (H) 11/07/2021    Lab Results  Component Value Date   CREATININE 0.70 08/01/2022   BUN 5 (L) 08/01/2022   NA 138 08/01/2022   K 3.5 08/01/2022   CL 103 08/01/2022   CO2 24 08/01/2022    Lab Results  Component Value Date   CHOL 159 11/07/2021   HDL 59 11/07/2021   LDLCALC 77 11/07/2021   TRIG 129 11/07/2021   CHOLHDL 2.7  11/07/2021    Medications Reviewed Today     Reviewed by Mickle Mallory, RN (Registered Nurse) on 03/14/22 at 1627  Med List Status: <None>   Medication Order Taking? Sig Documenting Provider Last Dose Status Informant  acetaminophen (TYLENOL) 650 MG CR tablet [id number]  Take 650 mg by mouth every 8 (eight) hours as needed for pain.  Patient not taking: Reported on 11/07/2021   [provider]  Active Self  aspirin EC 81 MG tablet [id number]  Take 1 tablet (81 mg total) by mouth daily. Swallow whole. Storm Frisk, MD  Active   atorvastatin (LIPITOR) 40 MG tablet [id number]  TAKE 1 TABLET (40 MG TOTAL) BY MOUTH ONCE DAILY. Storm Frisk, MD  Active   Blood Glucose Monitoring Suppl (TRUE METRIX METER) w/Device KIT [id number]  Use to measure blood sugar twice a day Storm Frisk, MD  Active   diltiazem [facility] CD) 120 MG 24 hr capsule [id number]  TAKE 1 CAPSULE BY MOUTH DAILY. Storm Frisk, MD  Active   glucose blood (TRUE METRIX BLOOD GLUCOSE TEST) test strip [id number]  Use as instructed Storm Frisk, MD  Active   ibuprofen (ADVIL) 800 MG tablet [id number]  Take 1 tablet (800 mg total) by mouth 3 (three) times daily.  Patient not taking: Reported on 11/07/2021   Olene Floss, PA-C  Active   Insulin Glargine [facility] KWIKPEN) 100 UNIT/ML [id number]  Inject 45 Units into the skin once daily. Storm Frisk, MD  Active   Insulin Pen Needle (TRUEPLUS 5-BEVEL PEN NEEDLES) 32G X 4  MM MISC [id number]  Use to inject insulin once daily. Storm Frisk, MD  Active   Liniments (PAIN RELIEF EX) [id number]  Apply 1 application topically 4 (four) times daily as needed (pain).  Patient not taking: Reported on 11/02/2020   [provider]  Active Self  metFORMIN (GLUCOPHAGE-XR) 500 MG 24 hr tablet [id number]  Take 2 tablets (1,000 mg total) by mouth 2 (two) times daily. Storm Frisk, MD  Active              Assessment/Plan:   Diabetes: - Currently  uncontrolled - Reviewed long term cardiovascular and renal outcomes of uncontrolled blood sugar - Reviewed goal A1c, goal fasting, and goal 2 hour post prandial glucose - Reviewed dietary modifications including MyPlate - Reviewed lifestyle modifications including: 150 minutes/week of aerobic exercise - Recommend to resume medications. She has not been seen in some time. Soonest available appt was in June. We do have recent labs from ED in 12/23. Safe to resume metformin, statin, insulin at this time.  - Recommend to check glucose daily. Supplies sent.  - Refills sent for other med classes including antihypertensives, statin, and testing supplies.  Follow Up Plan: June with Zelda   Butch Penny, PharmD, Patsy [place], CPP Clinical Pharmacist [facility] [phone]

## 2023-01-10 ENCOUNTER — Other Ambulatory Visit: Payer: Self-pay

## 2023-01-10 ENCOUNTER — Encounter (HOSPITAL_COMMUNITY): Payer: Self-pay | Admitting: Emergency Medicine

## 2023-01-10 ENCOUNTER — Ambulatory Visit (INDEPENDENT_AMBULATORY_CARE_PROVIDER_SITE_OTHER): Payer: Medicaid Other

## 2023-01-10 ENCOUNTER — Ambulatory Visit (HOSPITAL_COMMUNITY)
Admission: EM | Admit: 2023-01-10 | Discharge: 2023-01-10 | Disposition: A | Discharge status: Home / Self Care | Payer: Medicaid Other | Attending: Emergency Medicine | Admitting: Emergency Medicine

## 2023-01-10 DIAGNOSIS — E1165 Type 2 diabetes mellitus with hyperglycemia: Secondary | ICD-10-CM | POA: Diagnosis not present

## 2023-01-10 DIAGNOSIS — R109 Unspecified abdominal pain: Secondary | ICD-10-CM

## 2023-01-10 LAB — CBC
HCT: 35.9 % — ABNORMAL LOW (ref 36.0–46.0)
Hemoglobin: 12.3 g/dL (ref 12.0–15.0)
MCH: 29.1 pg (ref 26.0–34.0)
MCHC: 34.3 g/dL (ref 30.0–36.0)
MCV: 85.1 fL (ref 80.0–100.0)
Platelets: 242 10*3/uL (ref 150–400)
RBC: 4.22 MIL/uL (ref 3.87–5.11)
RDW: 12.5 % (ref 11.5–15.5)
WBC: 8.9 10*3/uL (ref 4.0–10.5)
nRBC: 0 % (ref 0.0–0.2)

## 2023-01-10 LAB — POCT URINALYSIS DIP (MANUAL ENTRY)
Bilirubin, UA: NEGATIVE
Blood, UA: NEGATIVE
Glucose, UA: >=1000 mg/dL — AB
Ketones, POC UA: NEGATIVE mg/dL
Leukocytes, UA: NEGATIVE
Nitrite, UA: NEGATIVE
Protein Ur, POC: NEGATIVE mg/dL
Spec Grav, UA: 1.015 (ref 1.010–1.025)
Urobilinogen, UA: 0.2 E.U./dL
pH, UA: 6 (ref 5.0–8.0)

## 2023-01-10 LAB — COMPREHENSIVE METABOLIC PANEL
ALT: 17 U/L (ref 0–44)
AST: 18 U/L (ref 15–41)
Albumin: 3.3 g/dL — ABNORMAL LOW (ref 3.5–5.0)
Alkaline Phosphatase: 43 U/L (ref 38–126)
Anion gap: 12 (ref 5–15)
BUN: 7 mg/dL (ref 6–20)
CO2: 24 mmol/L (ref 22–32)
Calcium: 8.9 mg/dL (ref 8.9–10.3)
Chloride: 101 mmol/L (ref 98–111)
Creatinine, Ser: 0.76 mg/dL (ref 0.44–1.00)
GFR, Estimated: >60 mL/min (ref >60)
Glucose, Bld: 381 mg/dL — ABNORMAL HIGH (ref 70–99)
Potassium: 3.6 mmol/L (ref 3.5–5.1)
Sodium: 137 mmol/L (ref 135–145)
Total Bilirubin: 0.4 mg/dL (ref 0.3–1.2)
Total Protein: 6.7 g/dL (ref 6.5–8.1)

## 2023-01-10 LAB — LIPASE, BLOOD: Lipase: 29 U/L (ref 11–51)

## 2023-01-10 LAB — POCT FASTING CBG KUC MANUAL ENTRY: POCT Glucose (KUC): 391 mg/dL — AB (ref 70–99)

## 2023-01-10 MED ORDER — NAPROXEN 500 MG PO TABS
500.0000 mg | ORAL_TABLET | Freq: Two times a day (BID) | ORAL | 0 refills | Status: DC
  Filled 2023-01-10: qty 20, 10d supply, fill #0

## 2023-01-10 NOTE — ED Provider Notes (Signed)
HPI  SUBJECTIVE:  Anna Golden is a 62 y.o. female who presents with 3 days of constant nonmigratory, nonradiating sharp, achy, sore left upper quadrant/left back and flank pain.  Occasional cough and shortness of breath that gets better with using her inhaler.  Reports nausea in the morning.  No vomiting.  No trauma to the area.  She denies recent heavy or repetitive lifting.  No wheezing, urinary complaints, abdominal distention, rash.  She had a normal bowel movement yesterday, with no change in her pain.  No chest pressure, pain, heaviness.  It is not associated with p.o. intake, fasting, urination.  She states that the car ride over here was painful.  States that her glucose has been running within 200s which is within normal limits for her.  She tried heat and sitting up with improvement in her symptoms.  Symptoms are worse with bending forward and with torso rotation.  She states this is identical to the symptoms that she was evaluated for in the ED in December.  She does not remember the final diagnosis.  Patient has a past medical history of poorly controlled diabetes, DKA, hypertension, uterine leiomyoma, paroxysmal atrial fibrillation on aspirin only, sickle cell trait, smoking, asthma, status post hernia repair.  Last hemoglobin A1c was 14.0 in March 23.  She was restarted on antihypertensives, statin, insulin on 4/29.  [facility]  She was seen in the ED in December 23 with left-sided flank/back/abdominal pain, glucose was 330, CBC, CMP lipase, abdominal CT normal.  I do not see a note with the final diagnosis.  Past Medical History:  Diagnosis Date   Acute asthma exacerbation 03/07/2014   Arthritis    Asthma    DKA (diabetic ketoacidoses) 03/07/2014   DM (diabetes mellitus) ([facility]) 04/24/2013   Dysmenorrhea 01/23/2013   Fatty liver disease, nonalcoholic    Hypertension    Leiomyoma of uterus, unspecified 01/23/2013   Leukocytosis    Menorrhagia with regular  cycle 01/23/2013   Obesity    PAF (paroxysmal atrial fibrillation) ([facility])    a. Dx 03/2013, spont converted to NSR.   Sickle cell trait ([facility])    Smoking 03/18/2014    Past Surgical History:  Procedure Laterality Date   HERNIA REPAIR      Family History  Problem Relation Age of Onset   Heart disease Mother    Diabetes Mother    Sickle cell anemia Mother    Cancer Father        colon    Social History   Tobacco Use   Smoking status: Every Day    Packs/day: .25    Types: Cigarettes   Smokeless tobacco: Never  Vaping Use   Vaping Use: Never used  Substance Use Topics   Alcohol use: No   Drug use: No    No current facility-administered medications for this encounter.  Current Outpatient Medications:    insulin glargine (LANTUS SOLOSTAR) 100 UNIT/ML Solostar Pen, Inject 45 Units into the skin daily., Disp: 45 mL, Rfl: 0   naproxen (NAPROSYN) 500 MG tablet, Take 1 tablet (500 mg total) by mouth 2 (two) times daily., Disp: 20 tablet, Rfl: 0   Accu-Chek Softclix Lancets lancets, Use to check blood sugar twice daily., Disp: 100 each, Rfl: 2   albuterol (PROVENTIL HFA) 108 (90 Base) MCG/ACT inhaler, Inhale 2 puffs into the lungs every 6 (six) hours as needed for wheezing or shortness of breath., Disp: 18 g, Rfl: 0   aspirin EC 81  MG tablet, Take 1 tablet (81 mg total) by mouth daily. Swallow whole., Disp: 100 tablet, Rfl: 1   atorvastatin (LIPITOR) 40 MG tablet, Take 1 tablet (40 mg total) by mouth daily., Disp: 90 tablet, Rfl: 0   Blood Glucose Monitoring Suppl (ACCU-CHEK GUIDE) w/Device KIT, Testing twice daily., Disp: 1 kit, Rfl: 0   diltiazem (CARDIZEM CD) 120 MG 24 hr capsule, Take 1 capsule (120 mg total) by mouth daily., Disp: 180 capsule, Rfl: 0   glucose blood (TRUE METRIX BLOOD GLUCOSE TEST) test strip, Use as instructed, Disp: 100 each, Rfl: 0   Insulin Pen Needle (TRUEPLUS 5-BEVEL PEN NEEDLES) 32G X 4 MM MISC, Use to inject insulin once daily., Disp: 100 each, Rfl: 0    metFORMIN (GLUCOPHAGE-XR) 500 MG 24 hr tablet, Take 2 tablets (1,000 mg total) by mouth 2 (two) times daily., Disp: 360 tablet, Rfl: 0  Allergies  Allergen Reactions   Metoprolol Other (See Comments)    "Patient slept for whole day after starting medication this week.  Patient does not want to take this med.  Not sure if med causes hypotension, but could not get out of bed for any reason"   Other Other (See Comments)    ANESTHESIA-CAUSES CARDIAC ARREST AND LUNGS COLLAPSED IN 1988, 2008.     Latex Itching    Swell up     ROS  As noted in HPI.   Physical Exam  BP 116/75 (BP Location: Left Arm)   Pulse 81   Temp 98.1 F (36.7 C) (Oral)   Resp 17   LMP 12/20/2014   SpO2 96%   Constitutional: Well developed, well nourished, no acute distress Eyes:  EOMI, conjunctiva normal bilaterally HENT: Normocephalic, atraumatic,mucus membranes moist Respiratory: Normal inspiratory effort, lungs clear bilaterally, good air movement Cardiovascular: Normal rate, regular rhythm, no murmurs rubs or gallops GI: Vertical scar inferior to the umbilicus.  Positive C-section scar.  Positive left flank tenderness.  No guarding, rebound.  No right lower quadrant, left lower quadrant tenderness.  No splenomegaly.  Active bowel sounds.  Nondistended.  Soft.  Tap table test negative. Back: Questionable left CVAT skin: No rash, skin intact Musculoskeletal: no deformities Neurologic: Alert & oriented x 3, no focal neuro deficits Psychiatric: Speech and behavior appropriate   ED Course   Medications - No data to display  Orders Placed This Encounter  Procedures   DG Abd Acute W/Chest    Standing Status:   Standing    Number of Occurrences:   1    Order Specific Question:   Reason for Exam (SYMPTOM  OR DIAGNOSIS REQUIRED)    Answer:   Left flank-back pain.  Rule out nephrolithiasis, obstruction, perforation   CBC    Standing Status:   Standing    Number of Occurrences:   1   Comprehensive  metabolic panel    Standing Status:   Standing    Number of Occurrences:   1   Lipase, blood    Standing Status:   Standing    Number of Occurrences:   1   POC CBG monitoring    Standing Status:   Standing    Number of Occurrences:   1   POC urinalysis dipstick    Standing Status:   Standing    Number of Occurrences:   1    Results for orders placed or performed during the hospital encounter of 01/10/23 (from the past 24 hour(s))  CBC     Status: Abnormal  Collection Time: 01/10/23  1:56 PM  Result Value Ref Range   WBC 8.9 4.0 - 10.5 K/uL   RBC 4.22 3.87 - 5.11 MIL/uL   Hemoglobin 12.3 12.0 - 15.0 g/dL   HCT 40.9 (L) 81.1 - 91.4 %   MCV 85.1 80.0 - 100.0 fL   MCH 29.1 26.0 - 34.0 pg   MCHC 34.3 30.0 - 36.0 g/dL   RDW 78.2 95.6 - 21.3 %   Platelets 242 150 - 400 K/uL   nRBC 0.0 0.0 - 0.2 %  Comprehensive metabolic panel     Status: Abnormal   Collection Time: 01/10/23  1:56 PM  Result Value Ref Range   Sodium 137 135 - 145 mmol/L   Potassium 3.6 3.5 - 5.1 mmol/L   Chloride 101 98 - 111 mmol/L   CO2 24 22 - 32 mmol/L   Glucose, Bld 381 (H) 70 - 99 mg/dL   BUN 7 6 - 20 mg/dL   Creatinine, Ser 0.86 0.44 - 1.00 mg/dL   Calcium 8.9 8.9 - 57.8 mg/dL   Total Protein 6.7 6.5 - 8.1 g/dL   Albumin 3.3 (L) 3.5 - 5.0 g/dL   AST 18 15 - 41 U/L   ALT 17 0 - 44 U/L   Alkaline Phosphatase 43 38 - 126 U/L   Total Bilirubin 0.4 0.3 - 1.2 mg/dL   GFR, Estimated >46 >96 mL/min   Anion gap 12 5 - 15  Lipase, blood     Status: None   Collection Time: 01/10/23  1:56 PM  Result Value Ref Range   Lipase 29 11 - 51 U/L  POC CBG monitoring     Status: Abnormal   Collection Time: 01/10/23  2:12 PM  Result Value Ref Range   POCT Glucose (KUC) 391 (A) 70 - 99 mg/dL  POC urinalysis dipstick     Status: Abnormal   Collection Time: 01/10/23  2:40 PM  Result Value Ref Range   Color, UA yellow yellow   Clarity, UA clear clear   Glucose, UA >=1,000 (A) negative mg/dL   Bilirubin, UA  negative negative   Ketones, POC UA negative negative mg/dL   Spec Grav, UA 2.952 8.413 - 1.025   Blood, UA negative negative   pH, UA 6.0 5.0 - 8.0   Protein Ur, POC negative negative mg/dL   Urobilinogen, UA 0.2 0.2 or 1.0 E.U./dL   Nitrite, UA Negative Negative   Leukocytes, UA Negative Negative   DG Abd Acute W/Chest  Result Date: 01/10/2023 CLINICAL DATA:  Left flank and back pain EXAM: DG ABDOMEN ACUTE WITH 1 VIEW CHEST COMPARISON:  07/26/2022 FINDINGS: Supine and upright frontal views of the abdomen and pelvis as well as an upright frontal view of the chest are obtained. The cardiac silhouette is unremarkable. No airspace disease, effusion, or pneumothorax. No acute bony abnormalities. The bowel gas pattern is unremarkable without obstruction or ileus. No masses or abnormal calcifications. No free gas in the greater peritoneal sac. No acute bony abnormalities. IMPRESSION: 1. Unremarkable abdominal series. Electronically Signed   By: Sharlet Salina M.D.   On: 01/10/2023 14:41    ED Clinical Impression  1. Left flank pain   2. Abdominal pain, unspecified abdominal location   3. Type 2 diabetes mellitus with hyperglycemia, unspecified whether long term insulin use [facility])      ED Assessment/Plan      Outside records, labs reviewed.  As noted in HPI.  In the differential is DKA, UTI, nephrolithiasis,  pyelonephritis, obstruction, mesenteric ischemia, pancreatitis, perforated viscus, diverticulitis, musculoskeletal pain.  Doubt GYN pathology such as ovarian torsion or infection.  Patient's abdomen is benign here although she states that going over bumps in the road were painful.  Tap table test negative.  Will evaluate for UTI, nephrolithiasis, obstruction, perforated viscus and pancreatitis here.  Doubt mesenteric ischemia as it is not affected with eating/activity.  Her heart is regular.  Offered patient transfer to the emergency department for comprehensive workup and imaging, but  patient would like to do as much of her workup as we can hear, with the understanding that I will contact her at [phone] if any of her labs are abnormal and she will go to the emergency department.  She will also go to the emergency department if she gets worse, is not better in 24 hours, or for other concerns.  Fingerstick 391.  She has over thousand glucose in her urine, but no ketones.  Reviewed imaging independently.  No pneumonia, effusion, pneumothorax perforation, free air, radiopaque stone, air-fluid levels.  See radiology report for details.  While she is hyperglycemic, there is no evidence of ketoacidosis with the absence of ketones in her urine.  Her abdominal series is negative for perforation, pulmonary process, radiopaque stone.    CMP significant for hyperglycemia.  No anion gap.  Normal kidney function, LFTs, CBC, lipase.   Labs, plain films reassuring.  Unsure to the cause of her symptoms but there does not appear to be an emergency at this time.  This could be musculoskeletal.   Will send home with Naprosyn/Tylenol, work note for today and tomorrow.  Will call patient if any of her labs come back abnormal and she will go to the ER for comprehensive evaluation, she will go to the ER if she gets worse or if not better in 24 hours.  Discussed labs, imaging, MDM, treatment plan, and plan for follow-up with patient. Discussed sn/sx that should prompt return to the ED. patient agrees with plan.   Meds ordered this encounter  Medications   naproxen (NAPROSYN) 500 MG tablet    Sig: Take 1 tablet (500 mg total) by mouth 2 (two) times daily.    Dispense:  20 tablet    Refill:  0      *This clinic note was created using Scientist, clinical (histocompatibility and immunogenetics). Therefore, there may be occasional mistakes despite careful proofreading.  ?    Domenick Gong, MD 01/10/23 [phone]

## 2023-01-10 NOTE — Discharge Instructions (Signed)
You do not have a kidney stone or urinary tract infection.  You do not appear to have a perforation or obstruction at this time.  You do so do not appear to have pneumonia.  You may take Naprosyn with 1000 mg of Tylenol twice a day as needed for pain.  I am going to contact you if any of your labs come back abnormal and you will need to go to the emergency department.  Your sugar was high here today, at 391.  Go to the emergency department if you get worse, or if you are not better in 24 hours.

## 2023-01-10 NOTE — ED Triage Notes (Signed)
Pt c/o left side pain and left back pains for little while. Denies falls or injury.Reports nausea. Denies vomiting, urinary or bowel problems. Uses inhaler that helps. Hasn't taken medications for pain

## 2023-02-13 ENCOUNTER — Encounter: Payer: Self-pay | Admitting: Nurse Practitioner

## 2023-02-13 ENCOUNTER — Other Ambulatory Visit: Payer: Self-pay

## 2023-02-13 ENCOUNTER — Ambulatory Visit: Payer: Medicaid Other | Attending: Nurse Practitioner | Admitting: Nurse Practitioner

## 2023-02-13 VITALS — BP 115/72 | HR 90 | Ht 63.0 in | Wt 178.6 lb

## 2023-02-13 DIAGNOSIS — Z7984 Long term (current) use of oral hypoglycemic drugs: Secondary | ICD-10-CM

## 2023-02-13 DIAGNOSIS — E7800 Pure hypercholesterolemia, unspecified: Secondary | ICD-10-CM | POA: Diagnosis not present

## 2023-02-13 DIAGNOSIS — M25551 Pain in right hip: Secondary | ICD-10-CM | POA: Diagnosis not present

## 2023-02-13 DIAGNOSIS — R296 Repeated falls: Secondary | ICD-10-CM

## 2023-02-13 DIAGNOSIS — G8929 Other chronic pain: Secondary | ICD-10-CM | POA: Diagnosis not present

## 2023-02-13 DIAGNOSIS — E1142 Type 2 diabetes mellitus with diabetic polyneuropathy: Secondary | ICD-10-CM

## 2023-02-13 DIAGNOSIS — M545 Low back pain, unspecified: Secondary | ICD-10-CM | POA: Diagnosis not present

## 2023-02-13 DIAGNOSIS — M25552 Pain in left hip: Secondary | ICD-10-CM | POA: Diagnosis not present

## 2023-02-13 DIAGNOSIS — E1165 Type 2 diabetes mellitus with hyperglycemia: Secondary | ICD-10-CM

## 2023-02-13 DIAGNOSIS — M25561 Pain in right knee: Secondary | ICD-10-CM | POA: Diagnosis not present

## 2023-02-13 MED ORDER — DICLOFENAC SODIUM 1 % EX GEL
4.0000 g | Freq: Four times a day (QID) | CUTANEOUS | 1 refills | Status: AC
  Filled 2023-02-13: qty 200, 13d supply, fill #0
  Filled 2024-02-04: qty 200, 13d supply, fill #1

## 2023-02-13 NOTE — Patient Instructions (Signed)
[facility] Address: [address] [place], [place], [place] [address] Phone: [phone]

## 2023-02-13 NOTE — Progress Notes (Signed)
Back, hip and lower extremity pain.

## 2023-02-13 NOTE — Progress Notes (Signed)
Assessment & Plan:  [name] was seen today for back pain.  Diagnoses and all orders for this visit:  Chronic bilateral low back pain without sciatica -     Ambulatory referral to Orthopedics -     DG Lumbar Spine Complete; Future -     diclofenac Sodium (VOLTAREN) 1 % GEL; Apply 4 g topically 4 (four) times daily. Joint pain  Chronic pain of right knee -     Ambulatory referral to Orthopedics -     diclofenac Sodium (VOLTAREN) 1 % GEL; Apply 4 g topically 4 (four) times daily. Joint pain  Chronic pain of both hips -     Ambulatory referral to Orthopedics -     diclofenac Sodium (VOLTAREN) 1 % GEL; Apply 4 g topically 4 (four) times daily. Joint pain  Falls frequently -     Ambulatory referral to Physical Therapy  Poorly controlled type 2 diabetes mellitus with peripheral neuropathy (HCC) -     Ambulatory referral to Podiatry -     Hemoglobin A1c -     Microalbumin / creatinine urine ratio  Hypercholesterolemia -     Lipid panel    Patient has been counseled on age-appropriate routine health concerns for screening and prevention. These are reviewed and up-to-date. Referrals have been placed accordingly. Immunizations are up-to-date or declined.    Subjective:   Chief Complaint  Patient presents with   Back Pain   HPI Anna Golden 62 y.o. female presents to office today with concerns of chronic back, hip and leg pain. Taking tylenol arthritis and an OTC topical cream with no relief.    States she has a history of falls due to balance disturbance and low back pain with bilateral LE pain. She does not use the cane she she has been instructed to use daily for ambulating.   has a past medical history of Arthritis, Asthma, Diabetes mellitus, Fatty liver disease, nonalcoholic, Hypertension, Hypokalemia, Leukocytosis, Obesity, PAF (paroxysmal atrial fibrillation with spontaneous conversion to NSR 03-2013) (HCC), Poor social situation, and Sickle cell trait (HCC).     Joint/Muscle Pain: Patient complains of arthralgias for which has been present for  over 1  year. Pain is located in both hip(s), both knee(s) R> L, and her lower back, is described as aching, dull, shooting, and throbbing, and is constant .  Associated symptoms include: decreased range of motion and instability.  Aggravating factors: prolonged sitting, standing, bending. The patient has  tried OTC tylenol arthritis and OTC topical gel .  Related to injury: no although she does state she has a history of multiple falls.  Hip xray  09-2021 Mild symmetrical bilateral hip osteoarthritis.   Lumbar Spine 09-2021 Mild lower lumbar facet hypertrophy, greatest at L4-5 and L5-S1.   Right knee xray 09-2021 Mild to moderate 3 compartmental osteoarthritis, greatest in the patellofemoral compartment.   Review of Systems  Constitutional:  Negative for fever, malaise/fatigue and weight loss.  HENT: Negative.  Negative for nosebleeds.   Eyes: Negative.  Negative for blurred vision, double vision and photophobia.  Respiratory: Negative.  Negative for cough and shortness of breath.   Cardiovascular: Negative.  Negative for chest pain, palpitations and leg swelling.  Gastrointestinal: Negative.  Negative for heartburn, nausea and vomiting.  Musculoskeletal:  Positive for back pain, falls and joint pain. Negative for myalgias.  Neurological: Negative.  Negative for dizziness, focal weakness, seizures and headaches.  Psychiatric/Behavioral: Negative.  Negative for suicidal ideas.     Past Medical History:  Diagnosis Date   Acute asthma exacerbation 03/07/2014   Arthritis    Asthma    DKA (diabetic ketoacidoses) 03/07/2014   DM (diabetes mellitus) (HCC) 04/24/2013   Dysmenorrhea 01/23/2013   Fatty liver disease, nonalcoholic    Hypertension    Leiomyoma of uterus, unspecified 01/23/2013   Leukocytosis    Menorrhagia with regular cycle 01/23/2013   Obesity    PAF (paroxysmal atrial fibrillation) (HCC)     a. Dx 03/2013, spont converted to NSR.   Sickle cell trait (HCC)    Smoking 03/18/2014    Past Surgical History:  Procedure Laterality Date   HERNIA REPAIR      Family History  Problem Relation Age of Onset   Heart disease Mother    Diabetes Mother    Sickle cell anemia Mother    Cancer Father        colon    Social History Reviewed with no changes to be made today.   Outpatient Medications Prior to Visit  Medication Sig Dispense Refill   Accu-Chek Softclix Lancets lancets Use to check blood sugar twice daily. 100 each 2   albuterol (PROVENTIL HFA) 108 (90 Base) MCG/ACT inhaler Inhale 2 puffs into the lungs every 6 (six) hours as needed for wheezing or shortness of breath. 18 g 0   aspirin EC 81 MG tablet Take 1 tablet (81 mg total) by mouth daily. Swallow whole. 100 tablet 1   atorvastatin (LIPITOR) 40 MG tablet Take 1 tablet (40 mg total) by mouth daily. 90 tablet 0   Blood Glucose Monitoring Suppl (ACCU-CHEK GUIDE) w/Device KIT Testing twice daily. 1 kit 0   diltiazem (CARDIZEM CD) 120 MG 24 hr capsule Take 1 capsule (120 mg total) by mouth daily. 180 capsule 0   glucose blood (TRUE METRIX BLOOD GLUCOSE TEST) test strip Use as instructed 100 each 0   insulin glargine (LANTUS SOLOSTAR) 100 UNIT/ML Solostar Pen Inject 45 Units into the skin daily. 45 mL 0   Insulin Pen Needle (TRUEPLUS 5-BEVEL PEN NEEDLES) 32G X 4 MM MISC Use to inject insulin once daily. 100 each 0   naproxen (NAPROSYN) 500 MG tablet Take 1 tablet (500 mg total) by mouth 2 (two) times daily. 20 tablet 0   metFORMIN (GLUCOPHAGE-XR) 500 MG 24 hr tablet Take 2 tablets (1,000 mg total) by mouth 2 (two) times daily. (Patient not taking: Reported on 02/13/2023) 360 tablet 0   No facility-administered medications prior to visit.    Allergies  Allergen Reactions   Metoprolol Other (See Comments)    "Patient slept for whole day after starting medication this week.  Patient does not want to take this med.  Not sure  if med causes hypotension, but could not get out of bed for any reason"   Other Other (See Comments)    ANESTHESIA-CAUSES CARDIAC ARREST AND LUNGS COLLAPSED IN 1988, 2008.     Latex Itching    Swell up       Objective:    BP 115/72   Pulse 90   Wt 178 lb 9.6 oz (81 kg)   LMP 12/20/2014   SpO2 99%   BMI 31.64 kg/m  Wt Readings from Last 3 Encounters:  02/13/23 178 lb 9.6 oz (81 kg)  07/26/22 180 lb (81.6 kg)  03/14/22 189 lb (85.7 kg)    Physical Exam Vitals and nursing note reviewed.  Constitutional:      Appearance: She is well-developed.  HENT:     Head: Normocephalic and atraumatic.  Cardiovascular:     Rate and Rhythm: Normal rate and regular rhythm.     Heart sounds: Normal heart sounds. No murmur heard.    No friction rub. No gallop.  Pulmonary:     Effort: Pulmonary effort is normal. No tachypnea or respiratory distress.     Breath sounds: Normal breath sounds. No decreased breath sounds, wheezing, rhonchi or rales.  Chest:     Chest wall: No tenderness.  Abdominal:     General: Bowel sounds are normal.     Palpations: Abdomen is soft.  Musculoskeletal:        General: Normal range of motion.     Cervical back: Normal range of motion.  Skin:    General: Skin is warm and dry.  Neurological:     Mental Status: She is alert and oriented to person, place, and time.     Coordination: Coordination normal.  Psychiatric:        Behavior: Behavior normal. Behavior is cooperative.        Thought Content: Thought content normal.        Judgment: Judgment normal.          Patient has been counseled extensively about nutrition and exercise as well as the importance of adherence with medications and regular follow-up. The patient was given clear instructions to go to ER or return to medical center if symptoms don't improve, worsen or new problems develop. The patient verbalized understanding.   Follow-up: Return in about 3 months (around 05/16/2023) for with PCP  Dr. Delford Field.Claiborne Rigg, FNP-BC [facility] [place], [place] [phone]   02/13/2023, 7:31 PM

## 2023-02-14 ENCOUNTER — Other Ambulatory Visit: Payer: Self-pay

## 2023-02-14 ENCOUNTER — Other Ambulatory Visit: Payer: Self-pay | Admitting: Nurse Practitioner

## 2023-02-14 DIAGNOSIS — E1142 Type 2 diabetes mellitus with diabetic polyneuropathy: Secondary | ICD-10-CM | POA: Diagnosis not present

## 2023-02-14 DIAGNOSIS — E1165 Type 2 diabetes mellitus with hyperglycemia: Secondary | ICD-10-CM | POA: Diagnosis not present

## 2023-02-14 LAB — LIPID PANEL
Chol/HDL Ratio: 2.1 ratio (ref 0.0–4.4)
Cholesterol, Total: 126 mg/dL (ref 100–199)
HDL: 60 mg/dL (ref >39)
LDL Chol Calc (NIH): 42 mg/dL (ref 0–99)
Triglycerides: 145 mg/dL (ref 0–149)
VLDL Cholesterol Cal: 24 mg/dL (ref 5–40)

## 2023-02-14 LAB — HEMOGLOBIN A1C
Est. average glucose Bld gHb Est-mCnc: 246 mg/dL
Hgb A1c MFr Bld: 10.2 % — ABNORMAL HIGH (ref 4.8–5.6)

## 2023-02-14 MED ORDER — EMPAGLIFLOZIN 10 MG PO TABS
10.0000 mg | ORAL_TABLET | Freq: Every day | ORAL | 1 refills | Status: DC
  Filled 2023-02-14 – 2023-02-15 (×2): qty 30, 30d supply, fill #0

## 2023-02-15 ENCOUNTER — Other Ambulatory Visit: Payer: Self-pay

## 2023-02-15 ENCOUNTER — Telehealth: Payer: Self-pay

## 2023-02-15 LAB — MICROALBUMIN / CREATININE URINE RATIO
Creatinine, Urine: 86 mg/dL
Microalb/Creat Ratio: 18 mg/g creat (ref 0–29)
Microalbumin, Urine: 15.7 ug/mL

## 2023-02-15 NOTE — Telephone Encounter (Signed)
-----   Message from Claiborne Rigg, NP sent at 02/14/2023  2:08 PM EDT ----- A1c still too high at 10. Goal is less than 7. Continue all medications as prescribed. Adding jardiance. Will need to repeat labs in 4 weeks.   Cholesterol levels normal. Continue atorvastatin daily.

## 2023-02-15 NOTE — Telephone Encounter (Signed)
Pt was called and is aware of results, DOB was confirmed.  ?

## 2023-02-18 ENCOUNTER — Ambulatory Visit (INDEPENDENT_AMBULATORY_CARE_PROVIDER_SITE_OTHER): Payer: Medicaid Other | Admitting: Physician Assistant

## 2023-02-18 ENCOUNTER — Ambulatory Visit
Admission: RE | Admit: 2023-02-18 | Discharge: 2023-02-18 | Disposition: A | Discharge status: Home / Self Care | Payer: Medicaid Other | Source: Ambulatory Visit | Attending: Nurse Practitioner | Admitting: Nurse Practitioner

## 2023-02-18 ENCOUNTER — Encounter: Payer: Self-pay | Admitting: Physician Assistant

## 2023-02-18 VITALS — Ht 63.0 in | Wt 178.0 lb

## 2023-02-18 DIAGNOSIS — M5416 Radiculopathy, lumbar region: Secondary | ICD-10-CM

## 2023-02-18 DIAGNOSIS — M545 Low back pain, unspecified: Secondary | ICD-10-CM | POA: Diagnosis not present

## 2023-02-18 DIAGNOSIS — G8929 Other chronic pain: Secondary | ICD-10-CM

## 2023-02-18 NOTE — Progress Notes (Signed)
Office Visit Note   Patient: Anna Golden           Date of Birth: 1960-11-28           MRN: [id number] Visit Date: 02/18/2023              Requested by: Claiborne Rigg, NP [address] [place] [address] [place],  [place] [address] PCP: Storm Frisk, MD   Assessment & Plan: Visit Diagnoses:  1. Lumbar radiculopathy     Plan: Pleasant 62 year old woman with a long history of low back pain.  She said she has had troubles with her back for a while.  A year ago she fell and since then she has had increasing pain in her back and her hips and her knees.  She does feel like she is falling more easily.  She did have a CT of her neck when she did this.  Did not show any significant findings.  Since then she has struggled with her back.  Describes pain in the low back that radiates down into both of her feet with some associated paresthesias.  She cannot take anti-inflammatories because of her blood pressure.  Unfortunately her most hemoglobin A1c was 10.2 which makes her ineligible for Medrol Dosepak's.  She was referred to physical therapy which she has not yet started.  Pain is worsened when she sits down for a while and tries to get up.  She also has some complaints in her both her hips and both of her knees but her back is what she says is moderate to severe.  I recommend an MRI will follow-up with her after this.  She understands she would not be a candidate for injections with her current A1c.  She has been given return precautions  Follow-Up Instructions: After MRI  Orders:  Orders Placed This Encounter  Procedures   MR Lumbar Spine w/o contrast   No orders of the defined types were placed in this encounter.     Procedures: No procedures performed   Clinical Data: No additional findings.   Subjective: Chief Complaint  Patient presents with   Right Leg - Pain   Left Leg - Pain    HPI pleasant 58 year old woman with a 1 year history of low back pain and weakness after a  fall.  Pain radiates into both legs and she feels her legs are a bit weak since then.  Has only had x-rays and a CT of her cervical spine which did not demonstrate any myelopathy.  She gets pain in her hips and her knees.  She has been told she has got arthritis in her knees as well  Review of Systems  All other systems reviewed and are negative.    Objective: Vital Signs: Ht 5\' 3"  (1.6 m)   Wt 178 lb (80.7 kg)   LMP 12/20/2014   BMI 31.53 kg/m   Physical Exam Constitutional:      Appearance: Normal appearance.  Pulmonary:     Effort: Pulmonary effort is normal.  Skin:    General: Skin is warm and dry.  Neurological:     Mental Status: She is alert.     Ortho Exam Examination she walks stooped over which is more comfortable for her.  Extension of her back reproduces pain in her lower back.  She has slight weakness in the left greater than the right side but is able to sustain a straight leg raise.  She has good dorsiflexion plantarflexion of  her ankles and legs has good extension and flexion.  No real pain with manipulation of her knees.  Sensations subjectively she says is altered Specialty Comments:  No specialty comments available.  Imaging: No results found.   PMFS History: Patient Active Problem List   Diagnosis Date Noted   Pain in both wrists 11/07/2021   Trigger finger 11/07/2021   Hip pain 11/07/2021   Thyromegaly 11/07/2021   RUQ abdominal pain 11/07/2021   Frequent falls 11/07/2021   Chronic right-sided low back pain without sciatica 08/01/2020   Mixed hyperlipidemia 08/01/2020   History of atrial fibrillation 08/01/2020   Folliculitis 08/01/2020   Smoking 03/18/2014   Arthritis 03/18/2014   Poorly controlled type 2 diabetes mellitus with peripheral neuropathy (HCC) 04/24/2013   Hypertension 04/24/2013   Obesity 04/24/2013   Leiomyoma of uterus, unspecified 01/23/2013   Dysmenorrhea 01/23/2013   Past Medical History:  Diagnosis Date   Acute asthma  exacerbation 03/07/2014   Arthritis    Asthma    DKA (diabetic ketoacidoses) 03/07/2014   DM (diabetes mellitus) (HCC) 04/24/2013   Dysmenorrhea 01/23/2013   Fatty liver disease, nonalcoholic    Hypertension    Leiomyoma of uterus, unspecified 01/23/2013   Leukocytosis    Menorrhagia with regular cycle 01/23/2013   Obesity    PAF (paroxysmal atrial fibrillation) (HCC)    a. Dx 03/2013, spont converted to NSR.   Sickle cell trait (HCC)    Smoking 03/18/2014    Family History  Problem Relation Age of Onset   Heart disease Mother    Diabetes Mother    Sickle cell anemia Mother    Cancer Father        colon    Past Surgical History:  Procedure Laterality Date   HERNIA REPAIR     Social History   Occupational History   Not on file  Tobacco Use   Smoking status: Every Day    Packs/day: .25    Types: Cigarettes   Smokeless tobacco: Never  Vaping Use   Vaping Use: Never used  Substance and Sexual Activity   Alcohol use: No   Drug use: No   Sexual activity: Yes

## 2023-02-22 ENCOUNTER — Other Ambulatory Visit: Payer: Self-pay

## 2023-03-04 ENCOUNTER — Encounter: Payer: Self-pay | Admitting: Podiatry

## 2023-03-04 ENCOUNTER — Ambulatory Visit: Payer: Medicaid Other | Admitting: Podiatry

## 2023-03-04 DIAGNOSIS — M79675 Pain in left toe(s): Secondary | ICD-10-CM | POA: Diagnosis not present

## 2023-03-04 DIAGNOSIS — E1165 Type 2 diabetes mellitus with hyperglycemia: Secondary | ICD-10-CM

## 2023-03-04 DIAGNOSIS — E1142 Type 2 diabetes mellitus with diabetic polyneuropathy: Secondary | ICD-10-CM

## 2023-03-04 DIAGNOSIS — M2042 Other hammer toe(s) (acquired), left foot: Secondary | ICD-10-CM

## 2023-03-04 DIAGNOSIS — M79674 Pain in right toe(s): Secondary | ICD-10-CM

## 2023-03-04 DIAGNOSIS — B351 Tinea unguium: Secondary | ICD-10-CM | POA: Diagnosis not present

## 2023-03-04 DIAGNOSIS — M2041 Other hammer toe(s) (acquired), right foot: Secondary | ICD-10-CM

## 2023-03-04 NOTE — Progress Notes (Signed)
  Subjective:  Patient ID: Anna Golden, female    DOB: February 17, 1961,   MRN: [id number]  Chief Complaint  Patient presents with   Nail Problem     Diabetic  foot care / foot exam     62 y.o. female presents for concern of thickened elongated and painful nails that are difficult to trim. Requesting to have them trimmed today. Relates burning and tingling in their feet. Patient is diabetic and last A1c was  Lab Results  Component Value Date   HGBA1C 10.2 (H) 02/13/2023   .   PCP:  Storm Frisk, MD    . Denies any other pedal complaints. Denies n/v/f/c.   Past Medical History:  Diagnosis Date   Acute asthma exacerbation 03/07/2014   Arthritis    Asthma    DKA (diabetic ketoacidoses) 03/07/2014   DM (diabetes mellitus) (HCC) 04/24/2013   Dysmenorrhea 01/23/2013   Fatty liver disease, nonalcoholic    Hypertension    Leiomyoma of uterus, unspecified 01/23/2013   Leukocytosis    Menorrhagia with regular cycle 01/23/2013   Obesity    PAF (paroxysmal atrial fibrillation) (HCC)    a. Dx 03/2013, spont converted to NSR.   Sickle cell trait (HCC)    Smoking 03/18/2014    Objective:  Physical Exam: Vascular: DP/PT pulses 2/4 bilateral. CFT <3 seconds. Absent hair growth on digits. Edema noted to bilateral lower extremities. Xerosis noted bilaterally.  Skin. No lacerations or abrasions bilateral feet. Nails 1-5 bilateral  are thickened discolored and elongated with subungual debris.  Musculoskeletal: MMT 5/5 bilateral lower extremities in DF, PF, Inversion and Eversion. Deceased ROM in DF of ankle joint. Hammered digits bilateral  Neurological: Sensation intact to light touch. Protective sensation diminished bilateral.    Assessment:   1. Pain due to onychomycosis of toenails of both feet   2. Poorly controlled type 2 diabetes mellitus with peripheral neuropathy (HCC)      Plan:  Patient was evaluated and treated and all questions answered. -Discussed and educated patient on  diabetic foot care, especially with  regards to the vascular, neurological and musculoskeletal systems.  -Stressed the importance of good glycemic control and the detriment of not  controlling glucose levels in relation to the foot. -Discussed supportive shoes at all times and checking feet regularly.  -Mechanically debrided all nails 1-5 bilateral using sterile nail nipper and filed with dremel without incident  -DM shoes ordered -Answered all patient questions -Patient to return  in 3 months for at risk foot care -Patient advised to call the office if any problems or questions arise in the meantime.   Louann Sjogren, DPM

## 2023-03-05 ENCOUNTER — Telehealth: Payer: Self-pay

## 2023-03-05 NOTE — Telephone Encounter (Signed)
Chart review completed for patient. Patient is due for screening mammogram. Left message on voice mail for patient to inquire about scheduling mammogram.  Jette Lewan, Population Health Specialist.   

## 2023-03-06 ENCOUNTER — Ambulatory Visit: Payer: Medicaid Other | Attending: Nurse Practitioner | Admitting: Physical Therapy

## 2023-03-06 ENCOUNTER — Other Ambulatory Visit: Payer: Self-pay

## 2023-03-06 ENCOUNTER — Encounter: Payer: Self-pay | Admitting: Physical Therapy

## 2023-03-06 DIAGNOSIS — R296 Repeated falls: Secondary | ICD-10-CM | POA: Insufficient documentation

## 2023-03-06 DIAGNOSIS — M5459 Other low back pain: Secondary | ICD-10-CM | POA: Insufficient documentation

## 2023-03-06 DIAGNOSIS — M6281 Muscle weakness (generalized): Secondary | ICD-10-CM | POA: Insufficient documentation

## 2023-03-06 DIAGNOSIS — R2681 Unsteadiness on feet: Secondary | ICD-10-CM | POA: Diagnosis not present

## 2023-03-06 DIAGNOSIS — M2550 Pain in unspecified joint: Secondary | ICD-10-CM | POA: Insufficient documentation

## 2023-03-06 NOTE — Therapy (Signed)
OUTPATIENT PHYSICAL THERAPY THORACOLUMBAR EVALUATION  Patient Name: Anna Golden MRN: [id number] DOB:Jul 16, 1961, 62 y.o., female Today's Date: 03/07/2023   PT End of Session - 03/07/23 1111     Visit Number 1    Number of Visits --   1-2x/week   Date for PT Re-Evaluation 05/02/23    Authorization Type [employer]    PT Start Time 1700    PT Stop Time 1745    PT Time Calculation (min) 45 min             Past Medical History:  Diagnosis Date   Acute asthma exacerbation 03/07/2014   Arthritis    Asthma    DKA (diabetic ketoacidoses) 03/07/2014   DM (diabetes mellitus) (HCC) 04/24/2013   Dysmenorrhea 01/23/2013   Fatty liver disease, nonalcoholic    Hypertension    Leiomyoma of uterus, unspecified 01/23/2013   Leukocytosis    Menorrhagia with regular cycle 01/23/2013   Obesity    PAF (paroxysmal atrial fibrillation) (HCC)    a. Dx 03/2013, spont converted to NSR.   Sickle cell trait (HCC)    Smoking 03/18/2014   Past Surgical History:  Procedure Laterality Date   HERNIA REPAIR     Patient Active Problem List   Diagnosis Date Noted   Pain in both wrists 11/07/2021   Trigger finger 11/07/2021   Hip pain 11/07/2021   Thyromegaly 11/07/2021   RUQ abdominal pain 11/07/2021   Frequent falls 11/07/2021   Chronic right-sided low back pain without sciatica 08/01/2020   Mixed hyperlipidemia 08/01/2020   History of atrial fibrillation 08/01/2020   Folliculitis 08/01/2020   Smoking 03/18/2014   Arthritis 03/18/2014   Poorly controlled type 2 diabetes mellitus with peripheral neuropathy (HCC) 04/24/2013   Hypertension 04/24/2013   Obesity 04/24/2013   Leiomyoma of uterus, unspecified 01/23/2013   Dysmenorrhea 01/23/2013    PCP: Storm Frisk, MD  REFERRING PROVIDER: Claiborne Rigg, NP  THERAPY DIAG:  Other low back pain - Plan: PT plan of care cert/re-cert  Multiple joint pain - Plan: PT plan of care cert/re-cert  Muscle weakness - Plan: PT plan of care  cert/re-cert  Unsteadiness on feet - Plan: PT plan of care cert/re-cert  REFERRING DIAG: Falls frequently [R29.6]   Rationale for Evaluation and Treatment:  Rehabilitation  SUBJECTIVE:  PERTINENT PAST HISTORY:  DM TII, HTN, Low back pain, Afib, hip pain, osteopenia?, RA? (Possibly per pt)      PRECAUTIONS: Fall  WEIGHT BEARING RESTRICTIONS No  FALLS:  Has patient fallen in last 6 months? Yes, Number of falls: 1 "legs just gave out"  MOI/History of condition:  Onset date: >5 years  SUBJECTIVE STATEMENT  Anna Golden is a 62 y.o. female who presents to clinic with chief complaint of multi joint pain with frequent falls. Her back pain is particularly limiting but she has pain in her elbows, wrists, shoulders, hips, knees, and ankles.  She reports that when she was in [place] she was told that she has 3 types of arthritis.  She is unclear if she has RA but is not currently followed by rheumatology.  She is in the process of getting her medical records sent to the primary care.  She is currently living in a shelter.  She has shooting pains in her legs, most often in her feet.  She endorses some n/t in her feet intermittently.  She denies saddle anesthesia or BB changes.   Red flags:  denies  Pain:  Are you having  pain? Yes Pain location: low back, hip knees NPRS scale:  4/10 to 10/10 Aggravating factors: walking, bending, squatting, lifting, working Relieving factors: rest, icy hot Pain description: sharp and aching Stage: Chronic Stability: getting worse 24 hour pattern: worse with activity   Occupation: Magazine features editor Device: SPC prn  Hand Dominance: NA  Patient Goals/Specific Activities: "get my body functioning better"   OBJECTIVE:   DIAGNOSTIC FINDINGS:  X-ray: IMPRESSION: Moderate lower lumbar facet degenerative changes.  GENERAL OBSERVATION/GAIT:  Slow antalgic gait with forward flexed posture  SENSATION:  Light touch: Deficits throughout  bil LE  LUMBAR AROM            Unable d/t instability   LE MMT:  MMT Right (Eval) Left (Eval)  Hip flexion (L2, L3) 3-* 3-*  Knee extension (L3) 3* 3*  Knee flexion 3* 3*  Hip abduction    Hip extension    Hip external rotation    Hip internal rotation    Hip adduction    Ankle dorsiflexion (L4) 3* 3*  Ankle plantarflexion (S1) 3* 3*  Ankle inversion    Ankle eversion    Great Toe ext (L5)    Grossly     (Blank rows = not tested, score listed is out of 5 possible points.  N = WNL, D = diminished, C = clear for gross weakness with myotome testing, * = concordant pain with testing)   SPECIAL TESTS:  Slump: L (~), R (~)  LE ROM:  ROM Right (Eval) Left (Eval)  Hip flexion    Hip extension    Hip abduction    Hip adduction    Hip internal rotation    Hip external rotation    Knee flexion    Knee extension    Ankle dorsiflexion    Ankle plantarflexion    Ankle inversion    Ankle eversion      (Blank rows = not tested, N = WNL, * = concordant pain with testing)  Functional Tests  Eval (03/07/2023)    Progressive balance screen (highest level completed for >/= 10''):  Feet together: 10'' Semi Tandem: R in rear unable, L in rear unable     10 m max gait speed: 12'', .83 m/s, AD: N    30'' STS: 3x  UE used? Y                                                     PALPATION:   TTP joint line of bil knees and low back (did not palpate other joints today)  PATIENT SURVEYS:  FOTO 40 -> 47   TODAY'S TREATMENT  Creating, reviewing, and completing below HEP  PATIENT EDUCATION:  POC, diagnosis, prognosis, HEP, and outcome measures.  Pt educated via explanation, demonstration, and handout (HEP).  Pt confirms understanding verbally.   HOME EXERCISE PROGRAM: Access Code: [email or web address] URL: [email or web address] Date: 03/07/2023 Prepared by: Alphonzo Severance  Exercises - Seated Hip Abduction with Resistance  - 1 x daily - 7 x weekly - 3  sets - 10 reps - Seated Hip Adduction Isometrics with Ball  - 1 x daily - 7 x weekly - 1 sets - 10 reps - 10 hold - Feet Together Balance at [employer] Eyes Closed  - 1 x daily - 7 x weekly - 1 sets - 3  reps - 60 seconds hold  Treatment priorities   Eval        General activity tolerance and strengthening                                          ASSESSMENT:  CLINICAL IMPRESSION: Anna Golden is a 62 y.o. female who presents to clinic with signs and sxs consistent with multi joint pain with particular difficulty with low back pain.  Her pain is so severe and irritability high, it is difficulty to get clear information during objective as almost all movements are provocative.  She is in the process of getting her health records sent from [place].  I encouraged her to follow up on this.  She reports she may have RA and given her widespread joint pain this seems plausible.  I think the best option right now is to try aquatic therapy for global exercise and activity tolerance and see how she does with this.      OBJECTIVE IMPAIRMENTS: Pain, lumbar ROM, core and LE strength, gait, balance  ACTIVITY LIMITATIONS: bending, squatting, lifting, walking, working  PERSONAL FACTORS: See medical history and pertinent history   REHAB POTENTIAL: Fair chronic severe multi joint pain  CLINICAL DECISION MAKING: Stable/uncomplicated  EVALUATION COMPLEXITY: Low   GOALS:   SHORT TERM GOALS: Target date: 04/04/2023   Anna Golden will be >75% HEP compliant to improve carryover between sessions and facilitate independent management of condition  Evaluation: ongoing Goal status: INITIAL   LONG TERM GOALS: Target date: 05/02/2023   Anna Golden will improve FOTO score to 47 as a proxy for functional improvement  Evaluation/Baseline: 40 Goal status: INITIAL    2.  Anna Golden will improve 10 meter max gait speed to 1 m/s (.1 m/s MCID) to show functional improvement in ambulation   Evaluation/Baseline: .83  m/s Goal status: INITIAL   Norms:     3.  Anna Golden will improve 30'' STS (MCID 2) to >/= 6x (w/ UE?: y) to show improved LE strength and improved transfers   Evaluation/Baseline: 3x  w/ UE? y Goal status: INITIAL   4.  Anna Golden will be able to stand for >30'' in semi tandem stance, to show a significant improvement in balance in order to reduce fall risk   Evaluation/Baseline: unable Goal status: INITIAL   5.  Anna Golden will self report >/= 33% decrease in pain from evaluation   Evaluation/Baseline: 10/10 max pain Goal status: INITIAL   PLAN: PT FREQUENCY: 1-2x/week  PT DURATION: 8 weeks  PLANNED INTERVENTIONS: Therapeutic exercises, Aquatic therapy, Therapeutic activity, Neuro Muscular re-education, Gait training, Patient/Family education, Joint mobilization, Dry Needling, Electrical stimulation, Spinal mobilization and/or manipulation, Moist heat, Taping, Vasopneumatic device, Ionotophoresis 4mg /ml Dexamethasone, and Manual therapy   Alphonzo Severance PT, DPT 03/07/2023, 11:25 AM  I just finished a MCD eval/recert.  Name: Deshonda Cryderman  MRN: [id number] Please request 2x/week for 8 weeks.  Check all conditions that are expected to impact treatment: Musculoskeletal disorders   Check all possible CPT codes: 40981- Therapeutic Exercise, [phone]- Neuro Re-education, [phone] - Gait Training, [phone] - Manual Therapy, 97530 - Therapeutic Activities, 97535 - Self Care, [phone] - Re-evaluation, [id number] - Mechanical traction, and [phone] - Aquatic therapy   Thank you!  MCD - Secure

## 2023-03-13 ENCOUNTER — Ambulatory Visit
Admission: RE | Admit: 2023-03-13 | Discharge: 2023-03-13 | Disposition: A | Discharge status: Home / Self Care | Payer: Medicaid Other | Source: Ambulatory Visit | Attending: Physician Assistant | Admitting: Physician Assistant

## 2023-03-13 DIAGNOSIS — M47816 Spondylosis without myelopathy or radiculopathy, lumbar region: Secondary | ICD-10-CM | POA: Diagnosis not present

## 2023-03-13 DIAGNOSIS — M48061 Spinal stenosis, lumbar region without neurogenic claudication: Secondary | ICD-10-CM | POA: Diagnosis not present

## 2023-03-13 DIAGNOSIS — M5416 Radiculopathy, lumbar region: Secondary | ICD-10-CM

## 2023-03-21 ENCOUNTER — Ambulatory Visit: Payer: Medicaid Other | Admitting: Physical Therapy

## 2023-03-21 DIAGNOSIS — R140 Abdominal distension (gaseous): Secondary | ICD-10-CM | POA: Diagnosis not present

## 2023-03-21 DIAGNOSIS — Z794 Long term (current) use of insulin: Secondary | ICD-10-CM | POA: Diagnosis not present

## 2023-03-21 DIAGNOSIS — Z8679 Personal history of other diseases of the circulatory system: Secondary | ICD-10-CM | POA: Diagnosis not present

## 2023-03-21 DIAGNOSIS — Z7982 Long term (current) use of aspirin: Secondary | ICD-10-CM | POA: Diagnosis not present

## 2023-03-21 DIAGNOSIS — E119 Type 2 diabetes mellitus without complications: Secondary | ICD-10-CM | POA: Diagnosis not present

## 2023-03-21 DIAGNOSIS — Z5901 Sheltered homelessness: Secondary | ICD-10-CM | POA: Diagnosis not present

## 2023-03-21 DIAGNOSIS — Z7984 Long term (current) use of oral hypoglycemic drugs: Secondary | ICD-10-CM | POA: Diagnosis not present

## 2023-03-21 DIAGNOSIS — Z79899 Other long term (current) drug therapy: Secondary | ICD-10-CM | POA: Diagnosis not present

## 2023-03-21 DIAGNOSIS — R000 Tachycardia, unspecified: Secondary | ICD-10-CM | POA: Diagnosis not present

## 2023-03-21 DIAGNOSIS — R42 Dizziness and giddiness: Secondary | ICD-10-CM | POA: Diagnosis not present

## 2023-03-21 DIAGNOSIS — Z20822 Contact with and (suspected) exposure to covid-19: Secondary | ICD-10-CM | POA: Diagnosis not present

## 2023-03-21 DIAGNOSIS — R079 Chest pain, unspecified: Secondary | ICD-10-CM | POA: Diagnosis not present

## 2023-03-21 DIAGNOSIS — R002 Palpitations: Secondary | ICD-10-CM | POA: Diagnosis not present

## 2023-03-21 NOTE — Therapy (Deleted)
OUTPATIENT PHYSICAL THERAPY THORACOLUMBAR EVALUATION  Patient Name: Anna Golden MRN: [id number] DOB:1960/09/07, 62 y.o., female Today's Date: 03/21/2023     Past Medical History:  Diagnosis Date   Acute asthma exacerbation 03/07/2014   Arthritis    Asthma    DKA (diabetic ketoacidoses) 03/07/2014   DM (diabetes mellitus) (HCC) 04/24/2013   Dysmenorrhea 01/23/2013   Fatty liver disease, nonalcoholic    Hypertension    Leiomyoma of uterus, unspecified 01/23/2013   Leukocytosis    Menorrhagia with regular cycle 01/23/2013   Obesity    PAF (paroxysmal atrial fibrillation) (HCC)    a. Dx 03/2013, spont converted to NSR.   Sickle cell trait (HCC)    Smoking 03/18/2014   Past Surgical History:  Procedure Laterality Date   HERNIA REPAIR     Patient Active Problem List   Diagnosis Date Noted   Pain in both wrists 11/07/2021   Trigger finger 11/07/2021   Hip pain 11/07/2021   Thyromegaly 11/07/2021   RUQ abdominal pain 11/07/2021   Frequent falls 11/07/2021   Chronic right-sided low back pain without sciatica 08/01/2020   Mixed hyperlipidemia 08/01/2020   History of atrial fibrillation 08/01/2020   Folliculitis 08/01/2020   Smoking 03/18/2014   Arthritis 03/18/2014   Poorly controlled type 2 diabetes mellitus with peripheral neuropathy (HCC) 04/24/2013   Hypertension 04/24/2013   Obesity 04/24/2013   Leiomyoma of uterus, unspecified 01/23/2013   Dysmenorrhea 01/23/2013    PCP: Storm Frisk, MD  REFERRING PROVIDER: Storm Frisk, MD  THERAPY DIAG:  No diagnosis found.  REFERRING DIAG: Falls frequently [R29.6]   Rationale for Evaluation and Treatment:  Rehabilitation  SUBJECTIVE:  PERTINENT PAST HISTORY:  DM TII, HTN, Low back pain, Afib, hip pain, osteopenia?, RA? (Possibly per pt)      PRECAUTIONS: Fall  WEIGHT BEARING RESTRICTIONS No  FALLS:  Has patient fallen in last 6 months? Yes, Number of falls: 1 "legs just gave out"  MOI/History of  condition:  Onset date: >5 years  SUBJECTIVE STATEMENT  ***   Red flags:  denies  Pain:  Are you having pain? Yes Pain location: low back, hip knees NPRS scale:  4/10 to 10/10 Aggravating factors: walking, bending, squatting, lifting, working Relieving factors: rest, icy hot Pain description: sharp and aching Stage: Chronic Stability: getting worse 24 hour pattern: worse with activity   Occupation: Magazine features editor Device: SPC prn  Hand Dominance: NA  Patient Goals/Specific Activities: "get my body functioning better"   OBJECTIVE:   DIAGNOSTIC FINDINGS:  X-ray: IMPRESSION: Moderate lower lumbar facet degenerative changes.  GENERAL OBSERVATION/GAIT:  Slow antalgic gait with forward flexed posture  SENSATION:  Light touch: Deficits throughout bil LE  LUMBAR AROM            Unable d/t instability   LE MMT:  MMT Right (Eval) Left (Eval)  Hip flexion (L2, L3) 3-* 3-*  Knee extension (L3) 3* 3*  Knee flexion 3* 3*  Hip abduction    Hip extension    Hip external rotation    Hip internal rotation    Hip adduction    Ankle dorsiflexion (L4) 3* 3*  Ankle plantarflexion (S1) 3* 3*  Ankle inversion    Ankle eversion    Great Toe ext (L5)    Grossly     (Blank rows = not tested, score listed is out of 5 possible points.  N = WNL, D = diminished, C = clear for gross weakness with myotome testing, * =  concordant pain with testing)   SPECIAL TESTS:  Slump: L (~), R (~)  LE ROM:  ROM Right (Eval) Left (Eval)  Hip flexion    Hip extension    Hip abduction    Hip adduction    Hip internal rotation    Hip external rotation    Knee flexion    Knee extension    Ankle dorsiflexion    Ankle plantarflexion    Ankle inversion    Ankle eversion      (Blank rows = not tested, N = WNL, * = concordant pain with testing)  Functional Tests  Eval (03/21/2023)    Progressive balance screen (highest level completed for >/= 10''):  Feet together:  10'' Semi Tandem: R in rear unable, L in rear unable     10 m max gait speed: 12'', .83 m/s, AD: N    30'' STS: 3x  UE used? Y                                                     PALPATION:   TTP joint line of bil knees and low back (did not palpate other joints today)  PATIENT SURVEYS:  FOTO 40 -> 47   TODAY'S TREATMENT  Creating, reviewing, and completing below HEP  PATIENT EDUCATION:  POC, diagnosis, prognosis, HEP, and outcome measures.  Pt educated via explanation, demonstration, and handout (HEP).  Pt confirms understanding verbally.   HOME EXERCISE PROGRAM: Access Code: [email or web address] URL: [email or web address] Date: 03/07/2023 Prepared by: Alphonzo Severance  Exercises - Seated Hip Abduction with Resistance  - 1 x daily - 7 x weekly - 3 sets - 10 reps - Seated Hip Adduction Isometrics with Ball  - 1 x daily - 7 x weekly - 1 sets - 10 reps - 10 hold - Feet Together Balance at [employer] Eyes Closed  - 1 x daily - 7 x weekly - 1 sets - 3 reps - 60 seconds hold  Treatment priorities   Eval        General activity tolerance and strengthening                                          ASSESSMENT:  CLINICAL IMPRESSION: Session today focused on *** in the aquatic environment for use of buoyancy to offload joints and the viscosity of water as resistance during therapeutic exercise.  ***.  Patient was able to tolerate all prescribed exercises in the aquatic environment with no adverse effects and reports ***/10 pain at the end of the session. Patient continues to benefit from skilled PT services on land and aquatic based and should be progressed as able to improve functional independence.      OBJECTIVE IMPAIRMENTS: Pain, lumbar ROM, core and LE strength, gait, balance  ACTIVITY LIMITATIONS: bending, squatting, lifting, walking, working  PERSONAL FACTORS: See medical history and pertinent history   REHAB POTENTIAL: Fair chronic severe multi  joint pain  CLINICAL DECISION MAKING: Stable/uncomplicated  EVALUATION COMPLEXITY: Low   GOALS:   SHORT TERM GOALS: Target date: 04/04/2023   Anna Golden will be >75% HEP compliant to improve carryover between sessions and facilitate independent management of condition  Evaluation: ongoing Goal status: INITIAL   LONG TERM GOALS:  Target date: 05/02/2023   Anna Golden will improve FOTO score to 47 as a proxy for functional improvement  Evaluation/Baseline: 40 Goal status: INITIAL    2.  Anna Golden will improve 10 meter max gait speed to 1 m/s (.1 m/s MCID) to show functional improvement in ambulation   Evaluation/Baseline: .83 m/s Goal status: INITIAL   Norms:     3.  Anna Golden will improve 30'' STS (MCID 2) to >/= 6x (w/ UE?: y) to show improved LE strength and improved transfers   Evaluation/Baseline: 3x  w/ UE? y Goal status: INITIAL   4.  Anna Golden will be able to stand for >30'' in semi tandem stance, to show a significant improvement in balance in order to reduce fall risk   Evaluation/Baseline: unable Goal status: INITIAL   5.  Anna Golden will self report >/= 33% decrease in pain from evaluation   Evaluation/Baseline: 10/10 max pain Goal status: INITIAL   PLAN: PT FREQUENCY: 1-2x/week  PT DURATION: 8 weeks  PLANNED INTERVENTIONS: Therapeutic exercises, Aquatic therapy, Therapeutic activity, Neuro Muscular re-education, Gait training, Patient/Family education, Joint mobilization, Dry Needling, Electrical stimulation, Spinal mobilization and/or manipulation, Moist heat, Taping, Vasopneumatic device, Ionotophoresis 4mg /ml Dexamethasone, and Manual therapy   Alphonzo Severance PT, DPT 03/21/2023, 8:05 AM

## 2023-03-27 ENCOUNTER — Other Ambulatory Visit: Payer: Medicaid Other

## 2023-03-27 NOTE — Therapy (Deleted)
OUTPATIENT PHYSICAL THERAPY THORACOLUMBAR EVALUATION  Patient Name: Anna Golden MRN: [id number] DOB:1960/12/19, 62 y.o., female Today's Date: 03/27/2023     Past Medical History:  Diagnosis Date   Acute asthma exacerbation 03/07/2014   Arthritis    Asthma    DKA (diabetic ketoacidoses) 03/07/2014   DM (diabetes mellitus) (HCC) 04/24/2013   Dysmenorrhea 01/23/2013   Fatty liver disease, nonalcoholic    Hypertension    Leiomyoma of uterus, unspecified 01/23/2013   Leukocytosis    Menorrhagia with regular cycle 01/23/2013   Obesity    PAF (paroxysmal atrial fibrillation) (HCC)    a. Dx 03/2013, spont converted to NSR.   Sickle cell trait (HCC)    Smoking 03/18/2014   Past Surgical History:  Procedure Laterality Date   HERNIA REPAIR     Patient Active Problem List   Diagnosis Date Noted   Pain in both wrists 11/07/2021   Trigger finger 11/07/2021   Hip pain 11/07/2021   Thyromegaly 11/07/2021   RUQ abdominal pain 11/07/2021   Frequent falls 11/07/2021   Chronic right-sided low back pain without sciatica 08/01/2020   Mixed hyperlipidemia 08/01/2020   History of atrial fibrillation 08/01/2020   Folliculitis 08/01/2020   Smoking 03/18/2014   Arthritis 03/18/2014   Poorly controlled type 2 diabetes mellitus with peripheral neuropathy (HCC) 04/24/2013   Hypertension 04/24/2013   Obesity 04/24/2013   Leiomyoma of uterus, unspecified 01/23/2013   Dysmenorrhea 01/23/2013    PCP: Storm Frisk, MD  REFERRING PROVIDER: Storm Frisk, MD  THERAPY DIAG:  No diagnosis found.  REFERRING DIAG: Falls frequently [R29.6]   Rationale for Evaluation and Treatment:  Rehabilitation  SUBJECTIVE:  PERTINENT PAST HISTORY:  DM TII, HTN, Low back pain, Afib, hip pain, osteopenia?, RA? (Possibly per pt)      PRECAUTIONS: Fall  WEIGHT BEARING RESTRICTIONS No  FALLS:  Has patient fallen in last 6 months? Yes, Number of falls: 1 "legs just gave out"  MOI/History of  condition:  Onset date: >5 years  SUBJECTIVE STATEMENT  ***   Red flags:  denies  Pain:  Are you having pain? Yes Pain location: low back, hip knees NPRS scale:  4/10 to 10/10 Aggravating factors: walking, bending, squatting, lifting, working Relieving factors: rest, icy hot Pain description: sharp and aching Stage: Chronic Stability: getting worse 24 hour pattern: worse with activity   Occupation: Magazine features editor Device: SPC prn  Hand Dominance: NA  Patient Goals/Specific Activities: "get my body functioning better"   OBJECTIVE:   DIAGNOSTIC FINDINGS:  X-ray: IMPRESSION: Moderate lower lumbar facet degenerative changes.  GENERAL OBSERVATION/GAIT:  Slow antalgic gait with forward flexed posture  SENSATION:  Light touch: Deficits throughout bil LE  LUMBAR AROM            Unable d/t instability   LE MMT:  MMT Right (Eval) Left (Eval)  Hip flexion (L2, L3) 3-* 3-*  Knee extension (L3) 3* 3*  Knee flexion 3* 3*  Hip abduction    Hip extension    Hip external rotation    Hip internal rotation    Hip adduction    Ankle dorsiflexion (L4) 3* 3*  Ankle plantarflexion (S1) 3* 3*  Ankle inversion    Ankle eversion    Great Toe ext (L5)    Grossly     (Blank rows = not tested, score listed is out of 5 possible points.  N = WNL, D = diminished, C = clear for gross weakness with myotome testing, * =  concordant pain with testing)   SPECIAL TESTS:  Slump: L (~), R (~)  LE ROM:  ROM Right (Eval) Left (Eval)  Hip flexion    Hip extension    Hip abduction    Hip adduction    Hip internal rotation    Hip external rotation    Knee flexion    Knee extension    Ankle dorsiflexion    Ankle plantarflexion    Ankle inversion    Ankle eversion      (Blank rows = not tested, N = WNL, * = concordant pain with testing)  Functional Tests  Eval (03/27/2023)    Progressive balance screen (highest level completed for >/= 10''):  Feet together:  10'' Semi Tandem: R in rear unable, L in rear unable     10 m max gait speed: 12'', .83 m/s, AD: N    30'' STS: 3x  UE used? Y                                                     PALPATION:   TTP joint line of bil knees and low back (did not palpate other joints today)  PATIENT SURVEYS:  FOTO 40 -> 47     PATIENT EDUCATION:  POC, diagnosis, prognosis, HEP, and outcome measures.  Pt educated via explanation, demonstration, and handout (HEP).  Pt confirms understanding verbally.   HOME EXERCISE PROGRAM: Access Code: [email or web address] URL: [email or web address] Date: 03/07/2023 Prepared by: Alphonzo Severance  Exercises - Seated Hip Abduction with Resistance  - 1 x daily - 7 x weekly - 3 sets - 10 reps - Seated Hip Adduction Isometrics with Ball  - 1 x daily - 7 x weekly - 1 sets - 10 reps - 10 hold - Feet Together Balance at [employer] Eyes Closed  - 1 x daily - 7 x weekly - 1 sets - 3 reps - 60 seconds hold  Treatment priorities   Eval        General activity tolerance and strengthening                                         TODAY'S TREATMENT   TREATMENT ***/***/24:  Aquatic therapy at [facility]- [address] - therapeutic pool temp 92 degrees Pt enters building independently.  Treatment took place in water 3.8 to  4 ft 8 in.feet deep depending upon activity.  Pt entered and exited the pool via stair and handrails    Aquatic Therapy:  Water walking for warm up fwd/lat/bkwds    Pt requires the buoyancy of water for active assisted exercises with buoyancy supported for strengthening and AROM exercises. Hydrostatic pressure also supports joints by unweighting joint load by at least 50 % in 3-4 feet depth water. 80% in chest to neck deep water. Water will provide assistance with movement using the current and laminar flow while the buoyancy reduces weight bearing. Pt requires the viscosity of the water for resistance with strengthening  exercises.   ASSESSMENT:  CLINICAL IMPRESSION: Session today focused on *** in the aquatic environment for use of buoyancy to offload joints and the viscosity of water as resistance during therapeutic exercise.  ***.  Patient was able to tolerate all prescribed exercises  in the aquatic environment with no adverse effects and reports ***/10 pain at the end of the session. Patient continues to benefit from skilled PT services on land and aquatic based and should be progressed as able to improve functional independence.      OBJECTIVE IMPAIRMENTS: Pain, lumbar ROM, core and LE strength, gait, balance  ACTIVITY LIMITATIONS: bending, squatting, lifting, walking, working  PERSONAL FACTORS: See medical history and pertinent history   REHAB POTENTIAL: Fair chronic severe multi joint pain  CLINICAL DECISION MAKING: Stable/uncomplicated  EVALUATION COMPLEXITY: Low   GOALS:   SHORT TERM GOALS: Target date: 04/04/2023   Kirk will be >75% HEP compliant to improve carryover between sessions and facilitate independent management of condition  Evaluation: ongoing Goal status: INITIAL   LONG TERM GOALS: Target date: 05/02/2023   Bettyjane will improve FOTO score to 47 as a proxy for functional improvement  Evaluation/Baseline: 40 Goal status: INITIAL    2.  Doni will improve 10 meter max gait speed to 1 m/s (.1 m/s MCID) to show functional improvement in ambulation   Evaluation/Baseline: .83 m/s Goal status: INITIAL   Norms:     3.  Tru will improve 30'' STS (MCID 2) to >/= 6x (w/ UE?: y) to show improved LE strength and improved transfers   Evaluation/Baseline: 3x  w/ UE? y Goal status: INITIAL   4.  Akiva will be able to stand for >30'' in semi tandem stance, to show a significant improvement in balance in order to reduce fall risk   Evaluation/Baseline: unable Goal status: INITIAL   5.  Seraphim will self report >/= 33% decrease in pain from evaluation    Evaluation/Baseline: 10/10 max pain Goal status: INITIAL   PLAN: PT FREQUENCY: 1-2x/week  PT DURATION: 8 weeks  PLANNED INTERVENTIONS: Therapeutic exercises, Aquatic therapy, Therapeutic activity, Neuro Muscular re-education, Gait training, Patient/Family education, Joint mobilization, Dry Needling, Electrical stimulation, Spinal mobilization and/or manipulation, Moist heat, Taping, Vasopneumatic device, Ionotophoresis 4mg /ml Dexamethasone, and Manual therapy   Alphonzo Severance PT, DPT 03/27/2023, 9:16 AM

## 2023-03-28 ENCOUNTER — Ambulatory Visit: Payer: Medicaid Other | Admitting: Physical Therapy

## 2023-04-01 ENCOUNTER — Ambulatory Visit: Payer: Medicaid Other

## 2023-04-01 NOTE — Progress Notes (Signed)
Patient presents to the office today for diabetic shoe and insole measuring.  Patient was measured with brannock device to determine size and width for 1 pair of extra depth shoes and foam casted for 3 pair of insoles.   Documentation of medical necessity will be sent to patient's treating diabetic doctor to verify and sign.   Patient's diabetic provider: Jeri Lager MD  Shoes and insoles will be ordered at that time and patient will be notified for an appointment for fitting when they arrive.   Shoe size (per patient): 8 Brannock measurement: 8W Patient shoe selection-  Shoe choice:   [id number]              2nd choice [id number] Shoe size ordered: 8WD  Financial form signed  Addison Bailey CPed, CFo, CFm

## 2023-04-04 ENCOUNTER — Encounter: Payer: Self-pay | Admitting: Physical Therapy

## 2023-04-04 ENCOUNTER — Ambulatory Visit: Payer: Medicaid Other | Attending: Nurse Practitioner | Admitting: Physical Therapy

## 2023-04-04 DIAGNOSIS — M5459 Other low back pain: Secondary | ICD-10-CM | POA: Diagnosis not present

## 2023-04-04 DIAGNOSIS — R2681 Unsteadiness on feet: Secondary | ICD-10-CM | POA: Insufficient documentation

## 2023-04-04 DIAGNOSIS — M2550 Pain in unspecified joint: Secondary | ICD-10-CM | POA: Diagnosis not present

## 2023-04-04 DIAGNOSIS — M6281 Muscle weakness (generalized): Secondary | ICD-10-CM | POA: Diagnosis not present

## 2023-04-04 NOTE — Therapy (Signed)
Daily Note  Patient Name: Anna Golden MRN: [id number] DOB:10/28/1960, 62 y.o., female Today's Date: 04/05/2023   PT End of Session - 04/04/23 1624     Visit Number 2    Number of Visits --   1-2x/week   Date for PT Re-Evaluation 05/02/23    Authorization Type [employer]    PT Start Time 1635   pt arrived late   PT Stop Time 1700    PT Time Calculation (min) 25 min              Past Medical History:  Diagnosis Date   Acute asthma exacerbation 03/07/2014   Arthritis    Asthma    DKA (diabetic ketoacidoses) 03/07/2014   DM (diabetes mellitus) (HCC) 04/24/2013   Dysmenorrhea 01/23/2013   Fatty liver disease, nonalcoholic    Hypertension    Leiomyoma of uterus, unspecified 01/23/2013   Leukocytosis    Menorrhagia with regular cycle 01/23/2013   Obesity    PAF (paroxysmal atrial fibrillation) (HCC)    a. Dx 03/2013, spont converted to NSR.   Sickle cell trait (HCC)    Smoking 03/18/2014   Past Surgical History:  Procedure Laterality Date   HERNIA REPAIR     Patient Active Problem List   Diagnosis Date Noted   Pain in both wrists 11/07/2021   Trigger finger 11/07/2021   Hip pain 11/07/2021   Thyromegaly 11/07/2021   RUQ abdominal pain 11/07/2021   Frequent falls 11/07/2021   Chronic right-sided low back pain without sciatica 08/01/2020   Mixed hyperlipidemia 08/01/2020   History of atrial fibrillation 08/01/2020   Folliculitis 08/01/2020   Smoking 03/18/2014   Arthritis 03/18/2014   Poorly controlled type 2 diabetes mellitus with peripheral neuropathy (HCC) 04/24/2013   Hypertension 04/24/2013   Obesity 04/24/2013   Leiomyoma of uterus, unspecified 01/23/2013   Dysmenorrhea 01/23/2013    PCP: Storm Frisk, MD  REFERRING PROVIDER: Storm Frisk, MD  THERAPY DIAG:  Other low back pain  Multiple joint pain  Muscle weakness  Unsteadiness on feet  REFERRING DIAG: Falls frequently [R29.6]   Rationale for Evaluation and Treatment:   Rehabilitation  SUBJECTIVE:  PERTINENT PAST HISTORY:  DM TII, HTN, Low back pain, Afib, hip pain, osteopenia?, RA? (Possibly per pt)      PRECAUTIONS: Fall  WEIGHT BEARING RESTRICTIONS No  FALLS:  Has patient fallen in last 6 months? Yes, Number of falls: 1 "legs just gave out"  MOI/History of condition:  Onset date: >5 years  SUBJECTIVE STATEMENT  Pt reports that her pain is about a 6/10 after working today.   Red flags:  denies  Pain:  Are you having pain? Yes Pain location: low back, hip knees NPRS scale:  4/10 to 10/10 Aggravating factors: walking, bending, squatting, lifting, working Relieving factors: rest, icy hot Pain description: sharp and aching Stage: Chronic Stability: getting worse 24 hour pattern: worse with activity   Occupation: Magazine features editor Device: SPC prn  Hand Dominance: NA  Patient Goals/Specific Activities: "get my body functioning better"   OBJECTIVE:   DIAGNOSTIC FINDINGS:  X-ray: IMPRESSION: Moderate lower lumbar facet degenerative changes.  GENERAL OBSERVATION/GAIT:  Slow antalgic gait with forward flexed posture  SENSATION:  Light touch: Deficits throughout bil LE  LUMBAR AROM            Unable d/t instability   LE MMT:  MMT Right (Eval) Left (Eval)  Hip flexion (L2, L3) 3-* 3-*  Knee extension (L3) 3* 3*  Knee  flexion 3* 3*  Hip abduction    Hip extension    Hip external rotation    Hip internal rotation    Hip adduction    Ankle dorsiflexion (L4) 3* 3*  Ankle plantarflexion (S1) 3* 3*  Ankle inversion    Ankle eversion    Great Toe ext (L5)    Grossly     (Blank rows = not tested, score listed is out of 5 possible points.  N = WNL, D = diminished, C = clear for gross weakness with myotome testing, * = concordant pain with testing)   SPECIAL TESTS:  Slump: L (~), R (~)  LE ROM:  ROM Right (Eval) Left (Eval)  Hip flexion    Hip extension    Hip abduction    Hip adduction    Hip  internal rotation    Hip external rotation    Knee flexion    Knee extension    Ankle dorsiflexion    Ankle plantarflexion    Ankle inversion    Ankle eversion      (Blank rows = not tested, N = WNL, * = concordant pain with testing)  Functional Tests  Eval (04/05/2023)    Progressive balance screen (highest level completed for >/= 10''):  Feet together: 10'' Semi Tandem: R in rear unable, L in rear unable     10 m max gait speed: 12'', .83 m/s, AD: N    30'' STS: 3x  UE used? Y                                                     PALPATION:   TTP joint line of bil knees and low back (did not palpate other joints today)  PATIENT SURVEYS:  FOTO 40 -> 47     PATIENT EDUCATION:  POC, diagnosis, prognosis, HEP, and outcome measures.  Pt educated via explanation, demonstration, and handout (HEP).  Pt confirms understanding verbally.   HOME EXERCISE PROGRAM: Access Code: [email or web address] URL: [email or web address] Date: 03/07/2023 Prepared by: Alphonzo Severance  Exercises - Seated Hip Abduction with Resistance  - 1 x daily - 7 x weekly - 3 sets - 10 reps - Seated Hip Adduction Isometrics with Ball  - 1 x daily - 7 x weekly - 1 sets - 10 reps - 10 hold - Feet Together Balance at [employer] Eyes Closed  - 1 x daily - 7 x weekly - 1 sets - 3 reps - 60 seconds hold  Treatment priorities   Eval        General activity tolerance and strengthening                                         TODAY'S TREATMENT   TREATMENT 04/04/23:  Aquatic therapy at [facility]- [address] - therapeutic pool temp 92 degrees Pt enters building independently.  Treatment took place in water 3.8 to  4 ft 8 in.feet deep depending upon activity.  Pt entered and exited the pool via stair and handrails    Aquatic Therapy:  Water walking for warm up fwd/lat  Hip abd Hip ext  Squats L stretch w/ hip thrust marches  Pt requires the buoyancy of water for active  assisted exercises  with buoyancy supported for strengthening and AROM exercises. Hydrostatic pressure also supports joints by unweighting joint load by at least 50 % in 3-4 feet depth water. 80% in chest to neck deep water. Water will provide assistance with movement using the current and laminar flow while the buoyancy reduces weight bearing. Pt requires the viscosity of the water for resistance with strengthening exercises.   ASSESSMENT:  CLINICAL IMPRESSION: Session today focused on gentle ROM as well as hip and core strengthening in the aquatic environment for use of buoyancy to offload joints and the viscosity of water as resistance during therapeutic exercise.  Pt was initially nervous about activity in the pool, but gained confidence quickly.  Pt arrived late so visit was truncated.  Patient was able to tolerate all prescribed exercises in the aquatic environment with no adverse effects and reports 4/10 pain at the end of the session. Patient continues to benefit from skilled PT services on land and aquatic based and should be progressed as able to improve functional independence.      OBJECTIVE IMPAIRMENTS: Pain, lumbar ROM, core and LE strength, gait, balance  ACTIVITY LIMITATIONS: bending, squatting, lifting, walking, working  PERSONAL FACTORS: See medical history and pertinent history   REHAB POTENTIAL: Fair chronic severe multi joint pain  CLINICAL DECISION MAKING: Stable/uncomplicated  EVALUATION COMPLEXITY: Low   GOALS:   SHORT TERM GOALS: Target date: 04/04/2023   Cailan will be >75% HEP compliant to improve carryover between sessions and facilitate independent management of condition  Evaluation: ongoing Goal status: INITIAL   LONG TERM GOALS: Target date: 05/02/2023   Elysha will improve FOTO score to 47 as a proxy for functional improvement  Evaluation/Baseline: 40 Goal status: INITIAL    2.  Juanika will improve 10 meter max gait speed to 1 m/s (.1 m/s  MCID) to show functional improvement in ambulation   Evaluation/Baseline: .83 m/s Goal status: INITIAL   Norms:     3.  Latrena will improve 30'' STS (MCID 2) to >/= 6x (w/ UE?: y) to show improved LE strength and improved transfers   Evaluation/Baseline: 3x  w/ UE? y Goal status: INITIAL   4.  Zaidyn will be able to stand for >30'' in semi tandem stance, to show a significant improvement in balance in order to reduce fall risk   Evaluation/Baseline: unable Goal status: INITIAL   5.  Dimon will self report >/= 33% decrease in pain from evaluation   Evaluation/Baseline: 10/10 max pain Goal status: INITIAL   PLAN: PT FREQUENCY: 1-2x/week  PT DURATION: 8 weeks  PLANNED INTERVENTIONS: Therapeutic exercises, Aquatic therapy, Therapeutic activity, Neuro Muscular re-education, Gait training, Patient/Family education, Joint mobilization, Dry Needling, Electrical stimulation, Spinal mobilization and/or manipulation, Moist heat, Taping, Vasopneumatic device, Ionotophoresis 4mg /ml Dexamethasone, and Manual therapy   Alphonzo Severance PT, DPT 04/05/2023, 7:03 AM

## 2023-04-10 ENCOUNTER — Encounter (HOSPITAL_COMMUNITY): Payer: Self-pay

## 2023-04-10 ENCOUNTER — Emergency Department (HOSPITAL_COMMUNITY)
Admission: EM | Admit: 2023-04-10 | Discharge: 2023-04-10 | Disposition: A | Discharge status: Home / Self Care | Payer: Medicaid Other

## 2023-04-10 ENCOUNTER — Other Ambulatory Visit: Payer: Self-pay

## 2023-04-10 ENCOUNTER — Emergency Department (HOSPITAL_COMMUNITY): Payer: Medicaid Other

## 2023-04-10 DIAGNOSIS — E119 Type 2 diabetes mellitus without complications: Secondary | ICD-10-CM | POA: Diagnosis not present

## 2023-04-10 DIAGNOSIS — Z7982 Long term (current) use of aspirin: Secondary | ICD-10-CM | POA: Diagnosis not present

## 2023-04-10 DIAGNOSIS — Z91040 Latex allergy status: Secondary | ICD-10-CM | POA: Diagnosis not present

## 2023-04-10 DIAGNOSIS — M25552 Pain in left hip: Secondary | ICD-10-CM | POA: Diagnosis present

## 2023-04-10 DIAGNOSIS — Z794 Long term (current) use of insulin: Secondary | ICD-10-CM | POA: Diagnosis not present

## 2023-04-10 DIAGNOSIS — M1612 Unilateral primary osteoarthritis, left hip: Secondary | ICD-10-CM | POA: Diagnosis not present

## 2023-04-10 DIAGNOSIS — R002 Palpitations: Secondary | ICD-10-CM | POA: Diagnosis not present

## 2023-04-10 DIAGNOSIS — M1990 Unspecified osteoarthritis, unspecified site: Secondary | ICD-10-CM | POA: Insufficient documentation

## 2023-04-10 DIAGNOSIS — I10 Essential (primary) hypertension: Secondary | ICD-10-CM | POA: Diagnosis not present

## 2023-04-10 DIAGNOSIS — Z79899 Other long term (current) drug therapy: Secondary | ICD-10-CM | POA: Insufficient documentation

## 2023-04-10 DIAGNOSIS — Z7984 Long term (current) use of oral hypoglycemic drugs: Secondary | ICD-10-CM | POA: Insufficient documentation

## 2023-04-10 LAB — BASIC METABOLIC PANEL
Anion gap: 9 (ref 5–15)
BUN: 7 mg/dL (ref 6–20)
CO2: 26 mmol/L (ref 22–32)
Calcium: 9.1 mg/dL (ref 8.9–10.3)
Chloride: 105 mmol/L (ref 98–111)
Creatinine, Ser: 0.74 mg/dL (ref 0.44–1.00)
GFR, Estimated: >60 mL/min (ref >60)
Glucose, Bld: 249 mg/dL — ABNORMAL HIGH (ref 70–99)
Potassium: 3.5 mmol/L (ref 3.5–5.1)
Sodium: 140 mmol/L (ref 135–145)

## 2023-04-10 LAB — TROPONIN I (HIGH SENSITIVITY)
Troponin I (High Sensitivity): 4 ng/L (ref <18)
Troponin I (High Sensitivity): 3 ng/L (ref <18)

## 2023-04-10 LAB — CBC
HCT: 35.2 % — ABNORMAL LOW (ref 36.0–46.0)
Hemoglobin: 12.2 g/dL (ref 12.0–15.0)
MCH: 29.3 pg (ref 26.0–34.0)
MCHC: 34.7 g/dL (ref 30.0–36.0)
MCV: 84.6 fL (ref 80.0–100.0)
Platelets: 241 10*3/uL (ref 150–400)
RBC: 4.16 MIL/uL (ref 3.87–5.11)
RDW: 12.3 % (ref 11.5–15.5)
WBC: 10.2 10*3/uL (ref 4.0–10.5)
nRBC: 0 % (ref 0.0–0.2)

## 2023-04-10 MED ORDER — IBUPROFEN 400 MG PO TABS
400.0000 mg | ORAL_TABLET | Freq: Once | ORAL | Status: AC
  Administered 2023-04-10: 400 mg via ORAL
  Filled 2023-04-10: qty 1

## 2023-04-10 MED ORDER — LACTATED RINGERS IV BOLUS
500.0000 mL | Freq: Once | INTRAVENOUS | Status: AC
  Administered 2023-04-10: 500 mL via INTRAVENOUS

## 2023-04-10 MED ORDER — ACETAMINOPHEN 500 MG PO TABS
1000.0000 mg | ORAL_TABLET | Freq: Once | ORAL | Status: AC
  Administered 2023-04-10: 1000 mg via ORAL
  Filled 2023-04-10: qty 2

## 2023-04-10 MED ORDER — DICLOFENAC SODIUM 1 % EX GEL: 2.0000 g | Freq: Four times a day (QID) | CUTANEOUS | 0 refills | Status: DC

## 2023-04-10 NOTE — Discharge Instructions (Signed)
Please follow-up with your primary doctor soon as possible.  Return to Emergency Department with fevers, chills, chest pain, shortness of breath, passout, your joints become red hot swollen or you have severe pain or develop any new or worsening symptoms that are concerning to you.

## 2023-04-10 NOTE — ED Triage Notes (Signed)
Pt c/o L hip and R shoulder pain since Sunday; no known injury; also endorses feeling lightheaded and fatigued; Hx DM, HTN, Arthritis

## 2023-04-10 NOTE — ED Provider Notes (Signed)
[place] EMERGENCY DEPARTMENT AT [facility] Provider Note   CSN: [id number] Arrival date & time: 04/10/23  1004     History  No chief complaint on file.   Anna Golden is a 62 y.o. female.  62 year old female presenting emergency department with a chief complaint of left hip pain and some lightheadedness.  Also complaining of right shoulder pain.  Reports that she has had some T fatigue and lightheadedness for the past 3 days.  Reported some intermittent palpitations.  None currently.  Also stating that she has some chronic hip pain that is typically right-sided, sometimes bilateral.  Also complaining of right shoulder pain same duration.  Denies any fevers, chills, chest pain, shortness of breath, abdominal pain.  No numbness tingling changes in sensation in lower extremities.  No trauma.        Home Medications Prior to Admission medications   Medication Sig Start Date End Date Taking? Authorizing Provider  diclofenac Sodium (VOLTAREN ARTHRITIS PAIN) 1 % GEL Apply 2 g topically 4 (four) times daily. 04/10/23  Yes Coral Spikes, DO  Accu-Chek Softclix Lancets lancets Use to check blood sugar twice daily. 12/24/22   Storm Frisk, MD  albuterol (PROVENTIL HFA) 108 (90 Base) MCG/ACT inhaler Inhale 2 puffs into the lungs every 6 (six) hours as needed for wheezing or shortness of breath. 12/24/22   Storm Frisk, MD  aspirin EC 81 MG tablet Take 1 tablet (81 mg total) by mouth daily. Swallow whole. 11/02/20   Storm Frisk, MD  atorvastatin (LIPITOR) 40 MG tablet Take 1 tablet (40 mg total) by mouth daily. 12/24/22   Storm Frisk, MD  Blood Glucose Monitoring Suppl (ACCU-CHEK GUIDE) w/Device KIT Testing twice daily. 12/24/22   Storm Frisk, MD  diltiazem (CARDIZEM CD) 120 MG 24 hr capsule Take 1 capsule (120 mg total) by mouth daily. 12/24/22   Storm Frisk, MD  empagliflozin (JARDIANCE) 10 MG TABS tablet Take 1 tablet (10 mg total) by mouth daily  before breakfast. 02/14/23   Claiborne Rigg, NP  glucose blood (TRUE METRIX BLOOD GLUCOSE TEST) test strip Use as instructed 12/24/22   Storm Frisk, MD  insulin glargine (LANTUS SOLOSTAR) 100 UNIT/ML Solostar Pen Inject 45 Units into the skin daily. 12/24/22   Storm Frisk, MD  Insulin Pen Needle (TRUEPLUS 5-BEVEL PEN NEEDLES) 32G X 4 MM MISC Use to inject insulin once daily. 12/24/22   Storm Frisk, MD  metFORMIN (GLUCOPHAGE-XR) 500 MG 24 hr tablet Take 2 tablets (1,000 mg total) by mouth 2 (two) times daily. 12/24/22   Storm Frisk, MD  gabapentin (NEURONTIN) 300 MG capsule Take 1 capsule (300 mg total) by mouth 3 (three) times daily. Patient not taking: No sig reported 08/01/20 11/02/20  Storm Frisk, MD  insulin aspart protamine- aspart (NOVOLOG MIX 70/30) (70-30) 100 UNIT/ML injection Inject 0.08 mLs (8 Units total) into the skin 2 (two) times daily with a meal. Patient not taking: Reported on 01/15/2015 04/26/14 01/26/15  Doris Cheadle, MD  insulin lispro (HUMALOG) 100 UNIT/ML KwikPen 5 units twice a day with two largest meals of the day Patient not taking: Reported on 11/02/2020 08/01/20 11/02/20  Storm Frisk, MD  potassium chloride (KLOR-CON) 10 MEQ tablet Take 1 tablet (10 mEq total) by mouth daily for 21 days. Patient not taking: Reported on 11/02/2020 08/01/20 11/02/20  Storm Frisk, MD      Allergies    Metoprolol, Other, and Latex  Review of Systems   Review of Systems  Physical Exam Updated Vital Signs BP 136/70   Pulse 70   Temp 98.7 F (37.1 C) (Oral)   Resp 18   LMP 12/20/2014   SpO2 100%  Physical Exam Vitals and nursing note reviewed.  Constitutional:      General: She is not in acute distress.    Appearance: She is not toxic-appearing.  HENT:     Head: Normocephalic.     Nose: Nose normal.     Mouth/Throat:     Mouth: Mucous membranes are moist.  Eyes:     Conjunctiva/sclera: Conjunctivae normal.  Cardiovascular:     Rate and Rhythm:  Normal rate and regular rhythm.  Pulmonary:     Effort: Pulmonary effort is normal.     Breath sounds: Normal breath sounds.  Abdominal:     General: Abdomen is flat. There is no distension.     Tenderness: There is no abdominal tenderness. There is no guarding or rebound.  Musculoskeletal:     Comments: Full ROM to right shoulder.  Good bicep tricep strength and grip strength.  Normal sensation.  No erythema or warmth to the joint.  Bilateral hips slightly tender to palpation.  No warmth or erythema.  Has 5 out of 5 plantarflexion 5 out of 5 dorsiflexion.  Normal sensation in lower extremities.  Neurological:     General: No focal deficit present.     Mental Status: She is alert.  Psychiatric:        Mood and Affect: Mood normal.        Behavior: Behavior normal.     ED Results / Procedures / Treatments   Labs (all labs ordered are listed, but only abnormal results are displayed) Labs Reviewed  CBC - Abnormal; Notable for the following components:      Result Value   HCT 35.2 (*)    All other components within normal limits  BASIC METABOLIC PANEL - Abnormal; Notable for the following components:   Glucose, Bld 249 (*)    All other components within normal limits  TROPONIN I (HIGH SENSITIVITY)  TROPONIN I (HIGH SENSITIVITY)    EKG EKG Interpretation Date/Time:  Wednesday April 10 2023 11:30:30 EDT Ventricular Rate:  72 PR Interval:  151 QRS Duration:  81 QT Interval:  408 QTC Calculation: 447 R Axis:   50  Text Interpretation: Sinus rhythm Low voltage, precordial leads Confirmed by Estanislado Pandy [phone]) on 04/10/2023 1:10:31 PM  Radiology DG Pelvis 1-2 Views  Result Date: 04/10/2023 CLINICAL DATA:  Chest pain.  Hip pain.  Shoulder pain EXAM: PELVIS - 1 VIEW COMPARISON:  None Available. FINDINGS: Osteopenia. Hyperostosis. No fracture or dislocation. Grossly preserved joint spaces. Presumed vascular calcifications along the pelvis IMPRESSION: Chronic changes.  No acute  osseous abnormality. Electronically Signed   By: Karen Kays M.D.   On: 04/10/2023 12:14   DG Shoulder Right  Result Date: 04/10/2023 CLINICAL DATA:  Pain.  Dizziness EXAM: RIGHT SHOULDER - 3 VIEW COMPARISON:  None Available. FINDINGS: Hyperostosis. No fracture or dislocation. Preserved joint spaces. Preserved bone mineralization. IMPRESSION: No acute osseous abnormality. Electronically Signed   By: Karen Kays M.D.   On: 04/10/2023 12:13   DG Chest 1 View  Result Date: 04/10/2023 CLINICAL DATA:  Chest pain EXAM: CHEST  1 VIEW COMPARISON:  X-ray 03/21/2023.  Older exams as well. FINDINGS: No consolidation, pneumothorax or effusion. No edema. Normal cardiopericardial silhouette. Degenerative changes are seen along the spine IMPRESSION:  No acute cardiopulmonary disease. Electronically Signed   By: Karen Kays M.D.   On: 04/10/2023 12:12    Procedures Procedures    Medications Ordered in ED Medications  lactated ringers bolus 500 mL (500 mLs Intravenous New Bag/Given 04/10/23 1141)  acetaminophen (TYLENOL) tablet 1,000 mg (1,000 mg Oral Given 04/10/23 1122)  ibuprofen (ADVIL) tablet 400 mg (400 mg Oral Given 04/10/23 1122)    ED Course/ Medical Decision Making/ A&P                                 Medical Decision Making 62 year old female presenting emergency department with multiple complaints.  She is afebrile nontachycardic hemodynamically stable.  History of diabetes, A-fib, chronic hip pain.  Presentation today reassuring.  Suspect symptoms likely related to chronic arthritis rather than acute process.  Low suspicion for septic joint.  No fever tachycardia leukocytosis to suggest systemic infection.  Her basic metabolic panel without derangement.  Normal kidney function.  Given her complaint of intermittent lightheadedness palpitations.  Chest x-ray EKG and troponin added on.  Also reassuring.  ACS unlikely.  Patient was treated supportively with Tylenol Motrin was also given some IV  fluids.  She states she is feeling much better.  Stable for discharge at this time.  Follow-up with primary doctor.  Amount and/or Complexity of Data Reviewed Labs: ordered. Radiology: ordered. ECG/medicine tests: ordered.  Risk OTC drugs. Prescription drug management.           Final Clinical Impression(s) / ED Diagnoses Final diagnoses:  Arthritis    Rx / DC Orders ED Discharge Orders          Ordered    diclofenac Sodium (VOLTAREN ARTHRITIS PAIN) 1 % GEL  4 times daily        04/10/23 1311              Coral Spikes, DO 04/10/23 1312

## 2023-04-10 NOTE — ED Notes (Signed)
Patient transported to X-ray 

## 2023-04-11 ENCOUNTER — Ambulatory Visit: Payer: Medicaid Other | Admitting: Physical Therapy

## 2023-04-11 ENCOUNTER — Encounter: Payer: Self-pay | Admitting: Physical Therapy

## 2023-04-11 DIAGNOSIS — M6281 Muscle weakness (generalized): Secondary | ICD-10-CM

## 2023-04-11 DIAGNOSIS — R2681 Unsteadiness on feet: Secondary | ICD-10-CM

## 2023-04-11 DIAGNOSIS — M2550 Pain in unspecified joint: Secondary | ICD-10-CM

## 2023-04-11 DIAGNOSIS — M5459 Other low back pain: Secondary | ICD-10-CM | POA: Diagnosis not present

## 2023-04-11 NOTE — Therapy (Signed)
Daily Note  Patient Name: Anna Golden MRN: [id number] DOB:[birth year], 62 y.o., female Today's Date: 04/11/2023   PT End of Session - 04/11/23 1620     Visit Number 3    Number of Visits 10   1-2x/week   Date for PT Re-Evaluation 05/02/23    Authorization Type [employer]    Authorization Time Period Approved 9 visits 03/11/23-05/09/23    PT Start Time 0420    PT Stop Time 0500    PT Time Calculation (min) 40 min               Past Medical History:  Diagnosis Date   Acute asthma exacerbation 03/07/2014   Arthritis    Asthma    DKA (diabetic ketoacidoses) 03/07/2014   DM (diabetes mellitus) (HCC) 04/24/2013   Dysmenorrhea 01/23/2013   Fatty liver disease, nonalcoholic    Hypertension    Leiomyoma of uterus, unspecified 01/23/2013   Leukocytosis    Menorrhagia with regular cycle 01/23/2013   Obesity    PAF (paroxysmal atrial fibrillation) (HCC)    a. Dx 03/2013, spont converted to NSR.   Sickle cell trait (HCC)    Smoking 03/18/2014   Past Surgical History:  Procedure Laterality Date   HERNIA REPAIR     Patient Active Problem List   Diagnosis Date Noted   Pain in both wrists 11/07/2021   Trigger finger 11/07/2021   Hip pain 11/07/2021   Thyromegaly 11/07/2021   RUQ abdominal pain 11/07/2021   Frequent falls 11/07/2021   Chronic right-sided low back pain without sciatica 08/01/2020   Mixed hyperlipidemia 08/01/2020   History of atrial fibrillation 08/01/2020   Folliculitis 08/01/2020   Smoking 03/18/2014   Arthritis 03/18/2014   Poorly controlled type 2 diabetes mellitus with peripheral neuropathy (HCC) 04/24/2013   Hypertension 04/24/2013   Obesity 04/24/2013   Leiomyoma of uterus, unspecified 01/23/2013   Dysmenorrhea 01/23/2013    PCP: Storm Frisk, MD  REFERRING PROVIDER: Storm Frisk, MD  THERAPY DIAG:  Other low back pain  Multiple joint pain  Muscle weakness  Unsteadiness on feet  REFERRING DIAG: Falls frequently [R29.6]    Rationale for Evaluation and Treatment:  Rehabilitation  SUBJECTIVE:  PERTINENT PAST HISTORY:  DM TII, HTN, Low back pain, Afib, hip pain, osteopenia?, RA? (Possibly per pt)      PRECAUTIONS: Fall  WEIGHT BEARING RESTRICTIONS No  FALLS:  Has patient fallen in last 6 months? Yes, Number of falls: 1 "legs just gave out"  MOI/History of condition:  Onset date: >5 years  SUBJECTIVE STATEMENT  Setareh reports that she was sore after last visit.  She rates her pain at 5/10 today.   Red flags:  denies  Pain:  Are you having pain? Yes Pain location: low back, hip knees NPRS scale:  4/10 to 10/10 Aggravating factors: walking, bending, squatting, lifting, working Relieving factors: rest, icy hot Pain description: sharp and aching Stage: Chronic Stability: getting worse 24 hour pattern: worse with activity   Occupation: [employer] features editor Device: SPC prn  Hand Dominance: NA  Patient Goals/Specific Activities: "get my body functioning better"   OBJECTIVE:   DIAGNOSTIC FINDINGS:  X-ray: IMPRESSION: Moderate lower lumbar facet degenerative changes.  GENERAL OBSERVATION/GAIT:  Slow antalgic gait with forward flexed posture  SENSATION:  Light touch: Deficits throughout bil LE  LUMBAR AROM            Unable d/t instability   LE MMT:  MMT Right (Eval) Left (Eval)  Hip flexion (  L2, L3) 3-* 3-*  Knee extension (L3) 3* 3*  Knee flexion 3* 3*  Hip abduction    Hip extension    Hip external rotation    Hip internal rotation    Hip adduction    Ankle dorsiflexion (L4) 3* 3*  Ankle plantarflexion (S1) 3* 3*  Ankle inversion    Ankle eversion    Great Toe ext (L5)    Grossly     (Blank rows = not tested, score listed is out of 5 possible points.  N = WNL, D = diminished, C = clear for gross weakness with myotome testing, * = concordant pain with testing)   SPECIAL TESTS:  Slump: L (~), R (~)  LE ROM:  ROM Right (Eval) Left (Eval)  Hip  flexion    Hip extension    Hip abduction    Hip adduction    Hip internal rotation    Hip external rotation    Knee flexion    Knee extension    Ankle dorsiflexion    Ankle plantarflexion    Ankle inversion    Ankle eversion      (Blank rows = not tested, N = WNL, * = concordant pain with testing)  Functional Tests  Eval (04/11/2023)    Progressive balance screen (highest level completed for >/= 10''):  Feet together: 10'' Semi Tandem: R in rear unable, L in rear unable     10 m max gait speed: 12'', .83 m/s, AD: N    30'' STS: 3x  UE used? Y                                                     PALPATION:   TTP joint line of bil knees and low back (did not palpate other joints today)  PATIENT SURVEYS:  FOTO 40 -> 47     PATIENT EDUCATION:  POC, diagnosis, prognosis, HEP, and outcome measures.  Pt educated via explanation, demonstration, and handout (HEP).  Pt confirms understanding verbally.   HOME EXERCISE PROGRAM: Access Code: [email or web address] URL: [email or web address] Date: 03/07/2023 Prepared by: Alphonzo Severance  Exercises - Seated Hip Abduction with Resistance  - 1 x daily - 7 x weekly - 3 sets - 10 reps - Seated Hip Adduction Isometrics with Ball  - 1 x daily - 7 x weekly - 1 sets - 10 reps - 10 hold - Feet Together Balance at [employer] Eyes Closed  - 1 x daily - 7 x weekly - 1 sets - 3 reps - 60 seconds hold  Treatment priorities   Eval        General activity tolerance and strengthening                                         TODAY'S TREATMENT   TREATMENT 04/11/23:  Aquatic therapy at [facility]- [address] - therapeutic pool temp 92 degrees Pt enters building independently.  Treatment took place in water 3.8 to  4 ft 8 in.feet deep depending upon activity.  Pt entered and exited the pool via stair and handrails    Aquatic Therapy:  Water walking for warm up fwd/lat  Squats L stretch w/ hip  thrust Marches Ai Chi postures 1-10  Pt requires the buoyancy of water for active assisted exercises with buoyancy supported for strengthening and AROM exercises. Hydrostatic pressure also supports joints by unweighting joint load by at least 50 % in 3-4 feet depth water. 80% in chest to neck deep water. Water will provide assistance with movement using the current and laminar flow while the buoyancy reduces weight bearing. Pt requires the viscosity of the water for resistance with strengthening exercises.   ASSESSMENT:  CLINICAL IMPRESSION: Session today focused on gentle ROM as well as hip and core strengthening in the aquatic environment for use of buoyancy to offload joints and the viscosity of water as resistance during therapeutic exercise.  Given pts increase in pain following last session we kept intensity low today.  She shows improved confidence in the water today.  She requires moderate to max cuing for Ai Chi postures but is able to complete with minimal increase in pain.  Patient was able to tolerate all prescribed exercises in the aquatic environment with no adverse effects and reports 4/10 pain at the end of the session. Patient continues to benefit from skilled PT services on land and aquatic based and should be progressed as able to improve functional independence.      OBJECTIVE IMPAIRMENTS: Pain, lumbar ROM, core and LE strength, gait, balance  ACTIVITY LIMITATIONS: bending, squatting, lifting, walking, working  PERSONAL FACTORS: See medical history and pertinent history   REHAB POTENTIAL: Fair chronic severe multi joint pain  CLINICAL DECISION MAKING: Stable/uncomplicated  EVALUATION COMPLEXITY: Low   GOALS:   SHORT TERM GOALS: Target date: 04/04/2023   Alexiya will be >75% HEP compliant to improve carryover between sessions and facilitate independent management of condition  Evaluation: ongoing Goal status: MEt   LONG TERM GOALS: Target date:  05/02/2023   Satasha will improve FOTO score to 47 as a proxy for functional improvement  Evaluation/Baseline: 40 Goal status: INITIAL    2.  Emmalei will improve 10 meter max gait speed to 1 m/s (.1 m/s MCID) to show functional improvement in ambulation   Evaluation/Baseline: .83 m/s Goal status: INITIAL   Norms:     3.  Suzzette will improve 30'' STS (MCID 2) to >/= 6x (w/ UE?: y) to show improved LE strength and improved transfers   Evaluation/Baseline: 3x  w/ UE? y Goal status: INITIAL   4.  Kali will be able to stand for >30'' in semi tandem stance, to show a significant improvement in balance in order to reduce fall risk   Evaluation/Baseline: unable Goal status: INITIAL   5.  Carleta will self report >/= 33% decrease in pain from evaluation   Evaluation/Baseline: 10/10 max pain Goal status: INITIAL   PLAN: PT FREQUENCY: 1-2x/week  PT DURATION: 8 weeks  PLANNED INTERVENTIONS: Therapeutic exercises, Aquatic therapy, Therapeutic activity, Neuro Muscular re-education, Gait training, Patient/Family education, Joint mobilization, Dry Needling, Electrical stimulation, Spinal mobilization and/or manipulation, Moist heat, Taping, Vasopneumatic device, Ionotophoresis 4mg /ml Dexamethasone, and Manual therapy   Alphonzo Severance PT, DPT 04/11/2023, 4:22 PM

## 2023-04-18 ENCOUNTER — Ambulatory Visit: Payer: Medicaid Other

## 2023-04-19 ENCOUNTER — Telehealth: Payer: Self-pay

## 2023-04-19 NOTE — Telephone Encounter (Signed)
Called patient and made appointment to discuss therapeutic shoes

## 2023-04-22 DIAGNOSIS — Z556 Problems related to health literacy: Secondary | ICD-10-CM | POA: Diagnosis not present

## 2023-04-22 DIAGNOSIS — U071 COVID-19: Secondary | ICD-10-CM | POA: Diagnosis not present

## 2023-04-24 ENCOUNTER — Telehealth: Payer: Self-pay

## 2023-04-24 NOTE — Telephone Encounter (Signed)
Copied from CRM [id number]. Topic: General - Other >> Apr 24, 2023  9:46 AM [place] B wrote: Reason for CRM: pt called in just to let it be known she tested positive for covid yesterday

## 2023-04-25 ENCOUNTER — Ambulatory Visit: Payer: Medicaid Other | Attending: Critical Care Medicine | Admitting: Critical Care Medicine

## 2023-04-25 ENCOUNTER — Other Ambulatory Visit: Payer: Self-pay

## 2023-04-25 ENCOUNTER — Telehealth: Payer: Self-pay | Admitting: Orthopedic Surgery

## 2023-04-25 ENCOUNTER — Encounter: Payer: Self-pay | Admitting: Critical Care Medicine

## 2023-04-25 DIAGNOSIS — I10 Essential (primary) hypertension: Secondary | ICD-10-CM | POA: Diagnosis not present

## 2023-04-25 DIAGNOSIS — E1142 Type 2 diabetes mellitus with diabetic polyneuropathy: Secondary | ICD-10-CM

## 2023-04-25 DIAGNOSIS — M545 Low back pain, unspecified: Secondary | ICD-10-CM | POA: Diagnosis not present

## 2023-04-25 DIAGNOSIS — E1165 Type 2 diabetes mellitus with hyperglycemia: Secondary | ICD-10-CM

## 2023-04-25 DIAGNOSIS — G8929 Other chronic pain: Secondary | ICD-10-CM | POA: Diagnosis not present

## 2023-04-25 DIAGNOSIS — Z8679 Personal history of other diseases of the circulatory system: Secondary | ICD-10-CM | POA: Diagnosis not present

## 2023-04-25 DIAGNOSIS — E782 Mixed hyperlipidemia: Secondary | ICD-10-CM

## 2023-04-25 DIAGNOSIS — U071 COVID-19: Secondary | ICD-10-CM

## 2023-04-25 DIAGNOSIS — Z7984 Long term (current) use of oral hypoglycemic drugs: Secondary | ICD-10-CM

## 2023-04-25 HISTORY — DX: COVID-19: U07.1

## 2023-04-25 MED ORDER — TRUE METRIX BLOOD GLUCOSE TEST VI STRP
ORAL_STRIP | 1 refills | Status: DC
Start: 2023-04-25 — End: 2024-02-04
  Filled 2023-04-25: qty 100, 34d supply, fill #0
  Filled 2024-02-04: qty 100, 34d supply, fill #1

## 2023-04-25 MED ORDER — ACCU-CHEK SOFTCLIX LANCETS MISC
2 refills | Status: DC
Start: 2023-04-25 — End: 2024-02-04
  Filled 2023-04-25: qty 100, 30d supply, fill #0
  Filled 2024-02-04: qty 100, 30d supply, fill #1

## 2023-04-25 MED ORDER — TRUEPLUS 5-BEVEL PEN NEEDLES 32G X 4 MM MISC
1 refills | Status: DC
Start: 2023-04-25 — End: 2023-05-21
  Filled 2023-04-25: qty 100, 34d supply, fill #0

## 2023-04-25 MED ORDER — LANTUS SOLOSTAR 100 UNIT/ML ~~LOC~~ SOPN
45.0000 [IU] | PEN_INJECTOR | Freq: Every day | SUBCUTANEOUS | 2 refills | Status: DC
  Filled 2023-04-25: qty 30, 66d supply, fill #0

## 2023-04-25 MED ORDER — METFORMIN HCL ER 500 MG PO TB24
1000.0000 mg | ORAL_TABLET | Freq: Two times a day (BID) | ORAL | 1 refills | Status: DC
  Filled 2023-04-25: qty 360, 90d supply, fill #0

## 2023-04-25 MED ORDER — EMPAGLIFLOZIN 10 MG PO TABS
10.0000 mg | ORAL_TABLET | Freq: Every day | ORAL | 2 refills | Status: DC
  Filled 2023-04-25: qty 60, 60d supply, fill #0

## 2023-04-25 MED ORDER — BENZONATATE 100 MG PO CAPS
100.0000 mg | ORAL_CAPSULE | Freq: Two times a day (BID) | ORAL | 0 refills | Status: DC | PRN
Start: 2023-04-25 — End: 2023-05-21
  Filled 2023-04-25: qty 20, 10d supply, fill #0

## 2023-04-25 MED ORDER — ALBUTEROL SULFATE HFA 108 (90 BASE) MCG/ACT IN AERS
2.0000 | INHALATION_SPRAY | Freq: Four times a day (QID) | RESPIRATORY_TRACT | 1 refills | Status: AC | PRN
  Filled 2023-04-25: qty 18, 25d supply, fill #0
  Filled 2024-02-04: qty 18, 25d supply, fill #1

## 2023-04-25 MED ORDER — NIRMATRELVIR/RITONAVIR (PAXLOVID)TABLET
3.0000 | ORAL_TABLET | Freq: Two times a day (BID) | ORAL | 0 refills | Status: AC
Start: 2023-04-25 — End: 2023-04-30
  Filled 2023-04-25: qty 30, 5d supply, fill #0

## 2023-04-25 MED ORDER — DILTIAZEM HCL ER COATED BEADS 120 MG PO CP24
120.0000 mg | ORAL_CAPSULE | Freq: Every day | ORAL | 2 refills | Status: DC
  Filled 2023-04-25: qty 90, 90d supply, fill #0

## 2023-04-25 MED ORDER — ATORVASTATIN CALCIUM 40 MG PO TABS
40.0000 mg | ORAL_TABLET | Freq: Every day | ORAL | 2 refills | Status: DC
Start: 2023-04-25 — End: 2023-05-21
  Filled 2023-04-25: qty 90, 90d supply, fill #0

## 2023-04-25 NOTE — Assessment & Plan Note (Addendum)
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  Continue with insulin glargine 45 units daily metformin a gram twice daily additional intervention is Jardiance daily

## 2023-04-25 NOTE — Assessment & Plan Note (Signed)
Patient received Paxlovid will hold atorvastatin for 7 days creatinine normal

## 2023-04-25 NOTE — Assessment & Plan Note (Signed)
MRI abnormal will have orthopedics follow-up

## 2023-04-25 NOTE — Assessment & Plan Note (Signed)
Continue statin. 

## 2023-04-25 NOTE — Assessment & Plan Note (Signed)
Unclear if is controlled will bring patient in for an in office exam and continue with monitoring alone

## 2023-04-25 NOTE — Telephone Encounter (Signed)
Mailed reminder letter to patient today 

## 2023-04-25 NOTE — Assessment & Plan Note (Signed)
Needs follow-up with cardiology she was lost to follow-up referral sent

## 2023-04-25 NOTE — Progress Notes (Signed)
Established Patient Office Visit  Subjective:  Patient ID: Anna Golden, female    DOB: 01-Dec-1960  Age: 62 y.o. MRN: [id number] Virtual Visit via Telephone Note  I connected with Anna Golden on 04/25/23 at  8:30 AM EDT by telephone and verified that I am speaking with the correct person using two identifiers.   Consent:  I discussed the limitations, risks, security and privacy concerns of performing an evaluation and management service by telephone and the availability of in person appointments. I also discussed with the patient that there may be a patient responsible charge related to this service. The patient expressed understanding and agreed to proceed.  Location of patient: Patient's at home  Location of provider: I am in my office  Persons participating in the televisit with the patient.   No one else on call    History of Present Illness:      CC: Podiatry exam for shoes with diabetes and COVID  HPI 10/2021 Meladie Golden presents for primary care to reestablish with prior history of hypertension and type 2 diabetes.  This patient has difficulty with using needles for insulin and been trying to manage her orally.  We tried on Farxiga but she did not tolerate this well.  She is only been taking metformin and she misses many doses.  Have not seen her since March 2022.  She is due a variety of primary care screenings  On arrival blood sugar 326 blood pressure 133/68.  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  She does not follow a healthy diet.  She fell February 6 because of weakness and was found to be volume depleted.  She was given a liter of saline had a series of x-rays which were negative.  She still has some soreness in the right hip and in the shoulders.  As stated she cannot tolerate Marcelline Deist because of side effects.  She is complaining of right upper quadrant abdominal pain and thyromegaly.  She is still smoking a pack every 2 days of cigarettes.  She  does not drink alcohol.  Patient also complains of bilateral wrist pain and weakness in the hands.  She also has frequent falls at home as well.  She states she has difficulty walking because of neuropathy in the feet.  The patient has a cane at home but she rarely uses this.  Patient's had physical therapy when she lived in [place] but she declines to receive this again.  She does not have any insurance she is working on [employer].  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  I am under the treatment of this patient.  Patient does need special shoes because of her significant diabetes.  Today is the most recent telehealth visit with diabetic management to be discussed.  Also this patient has developed COVID like illness symptoms began 2 days ago she went to the emergency room on August 26 they did not give her any treatments she significantly high risk due to her diabetes and other chronic medical conditions.    She is having increased cough loss of taste and smell headaches.  Blood sugars are improved.  She has been followed by clinical pharmacy and recently had her medications adjusted.  She has fallen previously and needs orthopedic follow-up.  Patient has a lumbar radiculopathy MRI recently done  showed abnormalities orthopedics yet to follow-up Last visit as asked below with pharmacy Diabetes: - Currently uncontrolled - Reviewed long term cardiovascular and renal outcomes of uncontrolled blood sugar - Reviewed goal A1c, goal fasting, and goal 2 hour post prandial glucose - Reviewed dietary modifications including MyPlate - Reviewed lifestyle modifications including: 150 minutes/week of aerobic exercise - Recommend to resume medications.   Past Medical History:  Diagnosis Date   Acute asthma exacerbation 03/07/2014    Arthritis    Asthma    DKA (diabetic ketoacidoses) 03/07/2014   DM (diabetes mellitus) (HCC) 04/24/2013   Dysmenorrhea 01/23/2013   Fatty liver disease, nonalcoholic    Hypertension    Leiomyoma of uterus, unspecified 01/23/2013   Leukocytosis    Menorrhagia with regular cycle 01/23/2013   Obesity    PAF (paroxysmal atrial fibrillation) (HCC)    a. Dx 03/2013, spont converted to NSR.   Sickle cell trait (HCC)    Smoking 03/18/2014    Past Surgical History:  Procedure Laterality Date   HERNIA REPAIR      Family History  Problem Relation Age of Onset   Heart disease Mother    Diabetes Mother    Sickle cell anemia Mother    Cancer Father        colon    Social History   Socioeconomic History   Marital status: Legally Separated    Spouse name: Not on file   Number of children: Not on file   Years of education: Not on file   Highest education level: Not on file  Occupational History   Not on file  Tobacco Use   Smoking status: Every Day    Current packs/day: 0.25    Types: Cigarettes   Smokeless tobacco: Never  Vaping Use   Vaping status: Never Used  Substance and Sexual Activity   Alcohol use: No   Drug use: No   Sexual activity: Yes  Other Topics Concern   Not on file  Social History Narrative   Not on file   Social Determinants of Health   Financial Resource Strain: Not on file  Food Insecurity: Not on file  Transportation Needs: Not on file  Physical Activity: Not on file  Stress: Not on file  Social Connections: Not on file  Intimate Partner Violence: Not on file    Outpatient Medications Prior to Visit  Medication Sig Dispense Refill   aspirin EC 81 MG tablet Take 1 tablet (81 mg total) by mouth daily. Swallow whole. 100 tablet 1   Blood Glucose Monitoring Suppl (ACCU-CHEK GUIDE) w/Device KIT Testing twice daily. 1 kit 0   Accu-Chek Softclix Lancets lancets Use to check blood sugar twice daily. 100 each 2   albuterol (PROVENTIL HFA) 108 (90 Base)  MCG/ACT inhaler Inhale 2 puffs into the lungs every 6 (six) hours as needed for wheezing or shortness of breath. 18 g 0   atorvastatin (LIPITOR) 40 MG tablet Take 1 tablet (40 mg total) by mouth daily. 90 tablet 0   diclofenac Sodium (VOLTAREN ARTHRITIS PAIN) 1 % GEL Apply 2 g topically 4 (four) times daily. 50 g 0   diltiazem (CARDIZEM CD) 120 MG 24 hr capsule Take 1 capsule (120 mg total) by mouth daily. 180 capsule 0   empagliflozin (JARDIANCE) 10 MG TABS tablet Take 1 tablet (10 mg total) by mouth daily before breakfast. 30 tablet 1   glucose blood (TRUE METRIX BLOOD GLUCOSE TEST) test strip Use as instructed 100 each 0   insulin  glargine (LANTUS SOLOSTAR) 100 UNIT/ML Solostar Pen Inject 45 Units into the skin daily. 45 mL 0   Insulin Pen Needle (TRUEPLUS 5-BEVEL PEN NEEDLES) 32G X 4 MM MISC Use to inject insulin once daily. 100 each 0   metFORMIN (GLUCOPHAGE-XR) 500 MG 24 hr tablet Take 2 tablets (1,000 mg total) by mouth 2 (two) times daily. 360 tablet 0   No facility-administered medications prior to visit.    Allergies  Allergen Reactions   Metoprolol Other (See Comments)    "Patient slept for whole day after starting medication this week.  Patient does not want to take this med.  Not sure if med causes hypotension, but could not get out of bed for any reason"   Other Other (See Comments)    ANESTHESIA-CAUSES CARDIAC ARREST AND LUNGS COLLAPSED IN 1988, 2008.     Latex Itching    Swell up    ROS Review of Systems  Constitutional:  Negative for fatigue.  HENT: Negative.  Negative for ear pain, postnasal drip, rhinorrhea, sinus pressure, sore throat, trouble swallowing and voice change.   Eyes: Negative.   Respiratory: Negative.  Negative for apnea, cough, choking, chest tightness, shortness of breath, wheezing and stridor.   Cardiovascular: Negative.  Negative for chest pain, palpitations and leg swelling.  Gastrointestinal:  Negative for abdominal distention, anal bleeding, blood  in stool, constipation, diarrhea, nausea and vomiting.  Endocrine: Negative for polydipsia, polyphagia and polyuria.  Genitourinary:  Negative for enuresis.  Musculoskeletal:  Positive for gait problem and joint swelling. Negative for arthralgias and myalgias.  Skin: Negative.  Negative for rash.  Allergic/Immunologic: Negative.  Negative for environmental allergies and food allergies.  Neurological:  Positive for numbness. Negative for dizziness, tremors, seizures, syncope, facial asymmetry, speech difficulty, weakness, light-headedness and headaches.  Hematological: Negative.  Negative for adenopathy. Does not bruise/bleed easily.  Psychiatric/Behavioral: Negative.  Negative for agitation and sleep disturbance. The patient is not nervous/anxious.       Objective:      LMP 12/20/2014  Wt Readings from Last 3 Encounters:  02/18/23 178 lb (80.7 kg)  02/13/23 178 lb 9.6 oz (81 kg)  07/26/22 180 lb (81.6 kg)     Health Maintenance Due  Topic Date Due   OPHTHALMOLOGY EXAM  Never done   DTaP/Tdap/Td (1 - Tdap) Never done   Zoster Vaccines- Shingrix (1 of 2) Never done   MAMMOGRAM  Never done   PAP SMEAR-Modifier  01/24/2016   COVID-19 Vaccine (3 - Moderna risk series) 06/18/2020   COLON CANCER SCREENING ANNUAL FOBT  08/01/2021   INFLUENZA VACCINE  03/28/2023    There are no preventive care reminders to display for this patient.  Lab Results  Component Value Date   TSH 1.640 11/07/2021   Lab Results  Component Value Date   WBC 10.2 04/10/2023   HGB 12.2 04/10/2023   HCT 35.2 (L) 04/10/2023   MCV 84.6 04/10/2023   PLT 241 04/10/2023   Lab Results  Component Value Date   NA 140 04/10/2023   K 3.5 04/10/2023   CO2 26 04/10/2023   GLUCOSE 249 (H) 04/10/2023   BUN 7 04/10/2023   CREATININE 0.74 04/10/2023   BILITOT 0.4 01/10/2023   ALKPHOS 43 01/10/2023   AST 18 01/10/2023   ALT 17 01/10/2023   PROT 6.7 01/10/2023   ALBUMIN 3.3 (L) 01/10/2023   CALCIUM 9.1  04/10/2023   ANIONGAP 9 04/10/2023   EGFR 92 11/07/2021   Lab Results  Component Value Date  CHOL 126 02/13/2023   Lab Results  Component Value Date   HDL 60 02/13/2023   Lab Results  Component Value Date   LDLCALC 42 02/13/2023   Lab Results  Component Value Date   TRIG 145 02/13/2023   Lab Results  Component Value Date   CHOLHDL 2.1 02/13/2023   Lab Results  Component Value Date   HGBA1C 10.2 (H) 02/13/2023      Assessment & Plan:   Problem List Items Addressed This Visit       Cardiovascular and Mediastinum   Hypertension (Chronic)    Unclear if is controlled will bring patient in for an in office exam and continue with monitoring alone      Relevant Medications   diltiazem (CARDIZEM CD) 120 MG 24 hr capsule   atorvastatin (LIPITOR) 40 MG tablet     Endocrine   Poorly controlled type 2 diabetes mellitus with peripheral neuropathy (HCC)    Peripheral neuropathy due to severe diabetes hitting fitted for special shoes with preulcerative callus and feet per podiatry.  Continue with insulin glargine 45 units daily metformin a gram twice daily additional intervention is Jardiance daily      Relevant Medications   insulin glargine (LANTUS SOLOSTAR) 100 UNIT/ML Solostar Pen   empagliflozin (JARDIANCE) 10 MG TABS tablet   atorvastatin (LIPITOR) 40 MG tablet   metFORMIN (GLUCOPHAGE-XR) 500 MG 24 hr tablet   Insulin Pen Needle (TRUEPLUS 5-BEVEL PEN NEEDLES) 32G X 4 MM MISC     Other   Chronic right-sided low back pain without sciatica - Primary    MRI abnormal will have orthopedics follow-up      Relevant Orders   Ambulatory referral to Orthopedic Surgery   Mixed hyperlipidemia    Continue statin      Relevant Medications   diltiazem (CARDIZEM CD) 120 MG 24 hr capsule   atorvastatin (LIPITOR) 40 MG tablet   History of atrial fibrillation    Needs follow-up with cardiology she was lost to follow-up referral sent      Relevant Orders   Ambulatory  referral to Cardiology   COVID    Patient received Paxlovid will hold atorvastatin for 7 days creatinine normal      Relevant Medications   nirmatrelvir/ritonavir (PAXLOVID) 20 x 150 MG & 10 x 100MG  TABS    Follow Up Instructions:    I discussed the assessment and treatment plan with the patient. The patient was provided an opportunity to ask questions and all were answered. The patient agreed with the plan and demonstrated an understanding of the instructions.   The patient was advised to call back or seek an in-person evaluation if the symptoms worsen or if the condition fails to improve as anticipated.  I provided 30 minutes of non-face-to-face time during this encounter  including  median intraservice time , review of notes, labs, imaging, medications  and explaining diagnosis and management to the patient .    Shan Levans, MD  Meds ordered this encounter  Medications   nirmatrelvir/ritonavir (PAXLOVID) 20 x 150 MG & 10 x 100MG  TABS    Sig: Take 3 tablets by mouth 2 (two) times daily for 5 days. (Take nirmatrelvir 150 mg two tablets twice daily for 5 days and ritonavir 100 mg one tablet twice daily for 5 days) Patient GFR is 60    Dispense:  30 tablet    Refill:  0   benzonatate (TESSALON) 100 MG capsule    Sig: Take 1 capsule (100 mg  total) by mouth 2 (two) times daily as needed for cough.    Dispense:  20 capsule    Refill:  0   albuterol (PROVENTIL HFA) 108 (90 Base) MCG/ACT inhaler    Sig: Inhale 2 puffs into the lungs every 6 (six) hours as needed for wheezing or shortness of breath.    Dispense:  18 g    Refill:  1   insulin glargine (LANTUS SOLOSTAR) 100 UNIT/ML Solostar Pen    Sig: Inject 45 Units into the skin daily.    Dispense:  45 mL    Refill:  2   diltiazem (CARDIZEM CD) 120 MG 24 hr capsule    Sig: Take 1 capsule (120 mg total) by mouth daily.    Dispense:  180 capsule    Refill:  2   empagliflozin (JARDIANCE) 10 MG TABS tablet    Sig: Take 1 tablet  (10 mg total) by mouth daily before breakfast.    Dispense:  60 tablet    Refill:  2   atorvastatin (LIPITOR) 40 MG tablet    Sig: Take 1 tablet (40 mg total) by mouth daily.    Dispense:  90 tablet    Refill:  2   metFORMIN (GLUCOPHAGE-XR) 500 MG 24 hr tablet    Sig: Take 2 tablets (1,000 mg total) by mouth 2 (two) times daily.    Dispense:  360 tablet    Refill:  1   glucose blood (TRUE METRIX BLOOD GLUCOSE TEST) test strip    Sig: Use as instructed    Dispense:  100 each    Refill:  1   Accu-Chek Softclix Lancets lancets    Sig: Use to check blood sugar twice daily.    Dispense:  100 each    Refill:  2   Insulin Pen Needle (TRUEPLUS 5-BEVEL PEN NEEDLES) 32G X 4 MM MISC    Sig: Use to inject insulin once daily.    Dispense:  100 each    Refill:  1  Follow-up: No follow-ups on file.    Shan Levans, MD

## 2023-04-26 ENCOUNTER — Telehealth: Payer: Self-pay

## 2023-04-26 NOTE — Telephone Encounter (Signed)
Error

## 2023-04-29 DIAGNOSIS — F17210 Nicotine dependence, cigarettes, uncomplicated: Secondary | ICD-10-CM | POA: Diagnosis not present

## 2023-04-29 DIAGNOSIS — R0981 Nasal congestion: Secondary | ICD-10-CM | POA: Diagnosis not present

## 2023-04-29 DIAGNOSIS — R058 Other specified cough: Secondary | ICD-10-CM | POA: Diagnosis not present

## 2023-04-29 DIAGNOSIS — U071 COVID-19: Secondary | ICD-10-CM | POA: Diagnosis not present

## 2023-04-29 DIAGNOSIS — R519 Headache, unspecified: Secondary | ICD-10-CM | POA: Diagnosis not present

## 2023-05-07 ENCOUNTER — Ambulatory Visit: Payer: Medicaid Other | Attending: Nurse Practitioner

## 2023-05-15 ENCOUNTER — Ambulatory Visit: Payer: Medicaid Other

## 2023-05-20 NOTE — Progress Notes (Unsigned)
Established Patient Office Visit  Subjective:  Patient ID: Anna Golden, female    DOB: Dec 02, 1960  Age: 62 y.o. MRN: [id number] Virtual Visit via Telephone Note  I connected with Anna Golden on 05/20/23 at  3:30 PM EDT by telephone and verified that I am speaking with the correct person using two identifiers.   Consent:  I discussed the limitations, risks, security and privacy concerns of performing an evaluation and management service by telephone and the availability of in person appointments. I also discussed with the patient that there may be a patient responsible charge related to this service. The patient expressed understanding and agreed to proceed.  Location of patient: Patient's at home  Location of provider: I am in my office  Persons participating in the televisit with the patient.   No one else on call    History of Present Illness:      CC: Podiatry exam for shoes with diabetes and COVID  HPI 10/2021 Anna Golden presents for primary care to reestablish with prior history of hypertension and type 2 diabetes.  This patient has difficulty with using needles for insulin and been trying to manage her orally.  We tried on Farxiga but she did not tolerate this well.  She is only been taking metformin and she misses many doses.  Have not seen her since March 2022.  She is due a variety of primary care screenings  On arrival blood sugar 326 blood pressure 133/68.  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  She does not follow a healthy diet.  She fell February 6 because of weakness and was found to be volume depleted.  She was given a liter of saline had a series of x-rays which were negative.  She still has some soreness in the right hip and in the shoulders.  As stated she cannot tolerate Marcelline Deist because of side effects.  She is complaining of right upper quadrant abdominal pain and thyromegaly.  She is still smoking a pack every 2 days of cigarettes.  She  does not drink alcohol.  Patient also complains of bilateral wrist pain and weakness in the hands.  She also has frequent falls at home as well.  She states she has difficulty walking because of neuropathy in the feet.  The patient has a cane at home but she rarely uses this.  Patient's had physical therapy when she lived in [place] but she declines to receive this again.  She does not have any insurance she is working on [employer].  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  I am under the treatment of this patient.  Patient does need special shoes because of her significant diabetes.  Today is the most recent telehealth visit with diabetic management to be discussed.  Also this patient has developed COVID like illness symptoms began 2 days ago she went to the emergency room on August 26 they did not give her any treatments she significantly high risk due to her diabetes and other chronic medical conditions.    She is having increased cough loss of taste and smell headaches.  Blood sugars are improved.  She has been followed by clinical pharmacy and recently had her medications adjusted.  She has fallen previously and needs orthopedic follow-up.  Patient has a lumbar radiculopathy MRI recently done  showed abnormalities orthopedics yet to follow-up Last visit as asked below with pharmacy Diabetes: - Currently uncontrolled - Reviewed long term cardiovascular and renal outcomes of uncontrolled blood sugar - Reviewed goal A1c, goal fasting, and goal 2 hour post prandial glucose - Reviewed dietary modifications including MyPlate - Reviewed lifestyle modifications including: 150 minutes/week of aerobic exercise - Recommend to resume medications.   Past Medical History:  Diagnosis Date   Acute asthma exacerbation 03/07/2014    Arthritis    Asthma    DKA (diabetic ketoacidoses) 03/07/2014   DM (diabetes mellitus) (HCC) 04/24/2013   Dysmenorrhea 01/23/2013   Fatty liver disease, nonalcoholic    Hypertension    Leiomyoma of uterus, unspecified 01/23/2013   Leukocytosis    Menorrhagia with regular cycle 01/23/2013   Obesity    PAF (paroxysmal atrial fibrillation) (HCC)    a. Dx 03/2013, spont converted to NSR.   Sickle cell trait (HCC)    Smoking 03/18/2014    Past Surgical History:  Procedure Laterality Date   HERNIA REPAIR      Family History  Problem Relation Age of Onset   Heart disease Mother    Diabetes Mother    Sickle cell anemia Mother    Cancer Father        colon    Social History   Socioeconomic History   Marital status: Legally Separated    Spouse name: Not on file   Number of children: Not on file   Years of education: Not on file   Highest education level: Not on file  Occupational History   Not on file  Tobacco Use   Smoking status: Every Day    Current packs/day: 0.25    Types: Cigarettes   Smokeless tobacco: Never  Vaping Use   Vaping status: Never Used  Substance and Sexual Activity   Alcohol use: No   Drug use: No   Sexual activity: Yes  Other Topics Concern   Not on file  Social History Narrative   Not on file   Social Determinants of Health   Financial Resource Strain: Not on file  Food Insecurity: Not on file  Transportation Needs: Not on file  Physical Activity: Not on file  Stress: Not on file  Social Connections: Not on file  Intimate Partner Violence: Not on file    Outpatient Medications Prior to Visit  Medication Sig Dispense Refill   Accu-Chek Softclix Lancets lancets Use to check blood sugar twice daily. 100 each 2   albuterol (PROVENTIL HFA) 108 (90 Base) MCG/ACT inhaler Inhale 2 puffs into the lungs every 6 (six) hours as needed for wheezing or shortness of breath. 18 g 1   aspirin EC 81 MG tablet Take 1 tablet (81 mg total) by mouth daily.  Swallow whole. 100 tablet 1   atorvastatin (LIPITOR) 40 MG tablet Take 1 tablet (40 mg total) by mouth daily. 90 tablet 2   benzonatate (TESSALON) 100 MG capsule Take 1 capsule (100 mg total) by mouth 2 (two) times daily as needed for cough. 20 capsule 0   Blood Glucose Monitoring Suppl (ACCU-CHEK GUIDE) w/Device KIT Testing twice daily. 1 kit 0   diltiazem (CARDIZEM CD) 120 MG 24 hr capsule Take 1 capsule (120 mg total) by mouth daily. 180 capsule 2   empagliflozin (JARDIANCE) 10 MG TABS tablet Take 1 tablet (10 mg total) by mouth daily before breakfast. 60 tablet 2   glucose blood (TRUE METRIX BLOOD GLUCOSE TEST) test strip Use as instructed 100  each 1   insulin glargine (LANTUS SOLOSTAR) 100 UNIT/ML Solostar Pen Inject 45 Units into the skin daily. 45 mL 2   Insulin Pen Needle (TRUEPLUS 5-BEVEL PEN NEEDLES) 32G X 4 MM MISC Use to inject insulin once daily. 100 each 1   metFORMIN (GLUCOPHAGE-XR) 500 MG 24 hr tablet Take 2 tablets (1,000 mg total) by mouth 2 (two) times daily. 360 tablet 1   No facility-administered medications prior to visit.    Allergies  Allergen Reactions   Metoprolol Other (See Comments)    "Patient slept for whole day after starting medication this week.  Patient does not want to take this med.  Not sure if med causes hypotension, but could not get out of bed for any reason"   Other Other (See Comments)    ANESTHESIA-CAUSES CARDIAC ARREST AND LUNGS COLLAPSED IN 1988, 2008.     Latex Itching    Swell up    ROS Review of Systems  Constitutional:  Negative for fatigue.  HENT: Negative.  Negative for ear pain, postnasal drip, rhinorrhea, sinus pressure, sore throat, trouble swallowing and voice change.   Eyes: Negative.   Respiratory: Negative.  Negative for apnea, cough, choking, chest tightness, shortness of breath, wheezing and stridor.   Cardiovascular: Negative.  Negative for chest pain, palpitations and leg swelling.  Gastrointestinal:  Negative for abdominal  distention, anal bleeding, blood in stool, constipation, diarrhea, nausea and vomiting.  Endocrine: Negative for polydipsia, polyphagia and polyuria.  Genitourinary:  Negative for enuresis.  Musculoskeletal:  Positive for gait problem and joint swelling. Negative for arthralgias and myalgias.  Skin: Negative.  Negative for rash.  Allergic/Immunologic: Negative.  Negative for environmental allergies and food allergies.  Neurological:  Positive for numbness. Negative for dizziness, tremors, seizures, syncope, facial asymmetry, speech difficulty, weakness, light-headedness and headaches.  Hematological: Negative.  Negative for adenopathy. Does not bruise/bleed easily.  Psychiatric/Behavioral: Negative.  Negative for agitation and sleep disturbance. The patient is not nervous/anxious.       Objective:      LMP 12/20/2014  Wt Readings from Last 3 Encounters:  02/18/23 178 lb (80.7 kg)  02/13/23 178 lb 9.6 oz (81 kg)  07/26/22 180 lb (81.6 kg)     Health Maintenance Due  Topic Date Due   OPHTHALMOLOGY EXAM  Never done   DTaP/Tdap/Td (1 - Tdap) Never done   Zoster Vaccines- Shingrix (1 of 2) Never done   MAMMOGRAM  Never done   Cervical Cancer Screening (HPV/Pap Cotest)  01/24/2016   COVID-19 Vaccine (3 - Moderna risk series) 06/18/2020   COLON CANCER SCREENING ANNUAL FOBT  08/01/2021   INFLUENZA VACCINE  03/28/2023    There are no preventive care reminders to display for this patient.  Lab Results  Component Value Date   TSH 1.640 11/07/2021   Lab Results  Component Value Date   WBC 10.2 04/10/2023   HGB 12.2 04/10/2023   HCT 35.2 (L) 04/10/2023   MCV 84.6 04/10/2023   PLT 241 04/10/2023   Lab Results  Component Value Date   NA 140 04/10/2023   K 3.5 04/10/2023   CO2 26 04/10/2023   GLUCOSE 249 (H) 04/10/2023   BUN 7 04/10/2023   CREATININE 0.74 04/10/2023   BILITOT 0.4 01/10/2023   ALKPHOS 43 01/10/2023   AST 18 01/10/2023   ALT 17 01/10/2023   PROT 6.7  01/10/2023   ALBUMIN 3.3 (L) 01/10/2023   CALCIUM 9.1 04/10/2023   ANIONGAP 9 04/10/2023   EGFR 92 11/07/2021  Lab Results  Component Value Date   CHOL 126 02/13/2023   Lab Results  Component Value Date   HDL 60 02/13/2023   Lab Results  Component Value Date   LDLCALC 42 02/13/2023   Lab Results  Component Value Date   TRIG 145 02/13/2023   Lab Results  Component Value Date   CHOLHDL 2.1 02/13/2023   Lab Results  Component Value Date   HGBA1C 10.2 (H) 02/13/2023      Assessment & Plan:   Problem List Items Addressed This Visit   None    Follow Up Instructions:    I discussed the assessment and treatment plan with the patient. The patient was provided an opportunity to ask questions and all were answered. The patient agreed with the plan and demonstrated an understanding of the instructions.   The patient was advised to call back or seek an in-person evaluation if the symptoms worsen or if the condition fails to improve as anticipated.  I provided 30 minutes of non-face-to-face time during this encounter  including  median intraservice time , review of notes, labs, imaging, medications  and explaining diagnosis and management to the patient .    Shan Levans, MD  No orders of the defined types were placed in this encounter. Follow-up: No follow-ups on file.    Shan Levans, MD

## 2023-05-21 ENCOUNTER — Other Ambulatory Visit: Payer: Self-pay

## 2023-05-21 ENCOUNTER — Ambulatory Visit
Payer: No Typology Code available for payment source | Attending: Critical Care Medicine | Admitting: Critical Care Medicine

## 2023-05-21 ENCOUNTER — Encounter: Payer: Self-pay | Admitting: Critical Care Medicine

## 2023-05-21 VITALS — BP 133/81 | HR 96 | Wt 175.2 lb

## 2023-05-21 DIAGNOSIS — F17200 Nicotine dependence, unspecified, uncomplicated: Secondary | ICD-10-CM

## 2023-05-21 DIAGNOSIS — F17210 Nicotine dependence, cigarettes, uncomplicated: Secondary | ICD-10-CM | POA: Insufficient documentation

## 2023-05-21 DIAGNOSIS — E1140 Type 2 diabetes mellitus with diabetic neuropathy, unspecified: Secondary | ICD-10-CM | POA: Insufficient documentation

## 2023-05-21 DIAGNOSIS — Z1231 Encounter for screening mammogram for malignant neoplasm of breast: Secondary | ICD-10-CM

## 2023-05-21 DIAGNOSIS — E1165 Type 2 diabetes mellitus with hyperglycemia: Secondary | ICD-10-CM

## 2023-05-21 DIAGNOSIS — Z1211 Encounter for screening for malignant neoplasm of colon: Secondary | ICD-10-CM

## 2023-05-21 DIAGNOSIS — Z794 Long term (current) use of insulin: Secondary | ICD-10-CM

## 2023-05-21 DIAGNOSIS — E119 Type 2 diabetes mellitus without complications: Secondary | ICD-10-CM

## 2023-05-21 DIAGNOSIS — E782 Mixed hyperlipidemia: Secondary | ICD-10-CM

## 2023-05-21 DIAGNOSIS — E1142 Type 2 diabetes mellitus with diabetic polyneuropathy: Secondary | ICD-10-CM

## 2023-05-21 DIAGNOSIS — E11621 Type 2 diabetes mellitus with foot ulcer: Secondary | ICD-10-CM | POA: Insufficient documentation

## 2023-05-21 DIAGNOSIS — I10 Essential (primary) hypertension: Secondary | ICD-10-CM

## 2023-05-21 DIAGNOSIS — Z5901 Sheltered homelessness: Secondary | ICD-10-CM

## 2023-05-21 DIAGNOSIS — Z7984 Long term (current) use of oral hypoglycemic drugs: Secondary | ICD-10-CM

## 2023-05-21 LAB — POCT GLYCOSYLATED HEMOGLOBIN (HGB A1C): HbA1c, POC (controlled diabetic range): 8.5 % — AB (ref 0.0–7.0)

## 2023-05-21 MED ORDER — DILTIAZEM HCL ER COATED BEADS 120 MG PO CP24
120.0000 mg | ORAL_CAPSULE | Freq: Every day | ORAL | 2 refills | Status: DC
  Filled 2023-05-21: qty 180, 180d supply, fill #0
  Filled 2024-02-04: qty 90, 90d supply, fill #0

## 2023-05-21 MED ORDER — TRUEPLUS 5-BEVEL PEN NEEDLES 32G X 4 MM MISC
1 refills | Status: AC
Start: 2023-05-21 — End: ?
  Filled 2023-05-21: qty 100, fill #0

## 2023-05-21 MED ORDER — ATORVASTATIN CALCIUM 40 MG PO TABS
40.0000 mg | ORAL_TABLET | Freq: Every day | ORAL | 2 refills | Status: DC
  Filled 2023-05-21 – 2024-02-04 (×2): qty 90, 90d supply, fill #0

## 2023-05-21 MED ORDER — LANTUS SOLOSTAR 100 UNIT/ML ~~LOC~~ SOPN
45.0000 [IU] | PEN_INJECTOR | Freq: Every day | SUBCUTANEOUS | 2 refills | Status: DC
  Filled 2023-05-21: qty 45, 100d supply, fill #0
  Filled 2024-02-04: qty 39, 86d supply, fill #0

## 2023-05-21 MED ORDER — METFORMIN HCL ER 500 MG PO TB24
1000.0000 mg | ORAL_TABLET | Freq: Two times a day (BID) | ORAL | 1 refills | Status: DC
  Filled 2023-05-21: qty 360, 90d supply, fill #0

## 2023-05-21 MED ORDER — EMPAGLIFLOZIN 10 MG PO TABS
10.0000 mg | ORAL_TABLET | Freq: Every day | ORAL | 2 refills | Status: DC
  Filled 2023-05-21 – 2024-02-04 (×2): qty 60, 60d supply, fill #0

## 2023-05-21 MED ORDER — BENZONATATE 100 MG PO CAPS
100.0000 mg | ORAL_CAPSULE | Freq: Two times a day (BID) | ORAL | 0 refills | Status: DC | PRN
  Filled 2023-05-21: qty 20, 10d supply, fill #0

## 2023-05-21 NOTE — Assessment & Plan Note (Signed)
? ? ??  Current smoking consumption amount: 10 cigarettes daily ? ?? Dicsussion on advise to quit smoking and smoking impacts: Cardiovascular health impacts ? ?? Patient's willingness to quit: Willing to quit ? ?? Methods to quit smoking discussed: Behavioral modification nicotine replacement ? ?? Medication management of smoking session drugs discussed: Nicotine patches ? ?? Resources provided:  AVS  ? ?? Setting quit date not established ? ?? Follow-up arranged 1 month ? ? ?Time spent counseling the patient: 5 minutes ? ?

## 2023-05-21 NOTE — Progress Notes (Signed)
Shoes and inserts are in patient need a fitting appt LM today  Anna Golden

## 2023-05-21 NOTE — Assessment & Plan Note (Signed)
Living at homeless shelter in [facility]

## 2023-05-21 NOTE — Assessment & Plan Note (Signed)
Hypertension currently well-controlled continue with diltiazem for atrial fibrillation prevention no other medications

## 2023-05-21 NOTE — Patient Instructions (Signed)
Stay on all medications as prescribed From lab pick up colon cancer screen kit Mammogram will be ordered Return 2 months,Pap smear included

## 2023-05-21 NOTE — Assessment & Plan Note (Signed)
The patient never started Jardiance a Kerch her to resume will increase insulin glargine to 45 units daily continue with metformin 1000 mg twice daily and refer to clinical pharmacy

## 2023-05-22 ENCOUNTER — Ambulatory Visit: Payer: Medicaid Other | Admitting: Orthopedic Surgery

## 2023-05-30 ENCOUNTER — Other Ambulatory Visit: Payer: Self-pay

## 2023-06-05 ENCOUNTER — Encounter: Payer: Self-pay | Admitting: Podiatry

## 2023-06-05 ENCOUNTER — Ambulatory Visit (INDEPENDENT_AMBULATORY_CARE_PROVIDER_SITE_OTHER): Payer: No Typology Code available for payment source | Admitting: Podiatry

## 2023-06-05 DIAGNOSIS — M79675 Pain in left toe(s): Secondary | ICD-10-CM

## 2023-06-05 DIAGNOSIS — M79674 Pain in right toe(s): Secondary | ICD-10-CM | POA: Diagnosis not present

## 2023-06-05 DIAGNOSIS — E1142 Type 2 diabetes mellitus with diabetic polyneuropathy: Secondary | ICD-10-CM

## 2023-06-05 DIAGNOSIS — E1165 Type 2 diabetes mellitus with hyperglycemia: Secondary | ICD-10-CM | POA: Diagnosis not present

## 2023-06-05 DIAGNOSIS — B351 Tinea unguium: Secondary | ICD-10-CM | POA: Diagnosis not present

## 2023-06-05 NOTE — Progress Notes (Signed)
  Subjective:  Patient ID: Anna Golden, female    DOB: [birth year],   MRN: [id number]  Chief Complaint  Patient presents with   Nail Problem    RFC.    62 y.o. female presents for concern of thickened elongated and painful nails that are difficult to trim. Requesting to have them trimmed today. Relates burning and tingling in their feet. Patient is diabetic and last A1c was  Lab Results  Component Value Date   HGBA1C 8.5 (A) 05/21/2023   .   PCP:  Storm Frisk, MD    . Denies any other pedal complaints. Denies n/v/f/c.   Past Medical History:  Diagnosis Date   Acute asthma exacerbation 03/07/2014   Arthritis    Asthma    COVID 04/25/2023   DKA (diabetic ketoacidoses) 03/07/2014   DM (diabetes mellitus) (HCC) 04/24/2013   Dysmenorrhea 01/23/2013   Fatty liver disease, nonalcoholic    Hypertension    Leiomyoma of uterus, unspecified 01/23/2013   Leukocytosis    Menorrhagia with regular cycle 01/23/2013   Obesity    PAF (paroxysmal atrial fibrillation) (HCC)    a. Dx 03/2013, spont converted to NSR.   Sickle cell trait (HCC)    Smoking 03/18/2014    Objective:  Physical Exam: Vascular: DP/PT pulses 2/4 bilateral. CFT <3 seconds. Absent hair growth on digits. Edema noted to bilateral lower extremities. Xerosis noted bilaterally.  Skin. No lacerations or abrasions bilateral feet. Nails 1-5 bilateral  are thickened discolored and elongated with subungual debris.  Musculoskeletal: MMT 5/5 bilateral lower extremities in DF, PF, Inversion and Eversion. Deceased ROM in DF of ankle joint. Hammered digits bilateral  Neurological: Sensation intact to light touch. Protective sensation diminished bilateral.    Assessment:   1. Poorly controlled type 2 diabetes mellitus with peripheral neuropathy (HCC)   2. Pain due to onychomycosis of toenails of both feet      Plan:  Patient was evaluated and treated and all questions answered. -Discussed and educated patient on diabetic  foot care, especially with  regards to the vascular, neurological and musculoskeletal systems.  -Stressed the importance of good glycemic control and the detriment of not  controlling glucose levels in relation to the foot. -Discussed supportive shoes at all times and checking feet regularly.  -Mechanically debrided all nails 1-5 bilateral using sterile nail nipper and filed with dremel without incident  -Awaiting DM shoes -Answered all patient questions -Patient to return  in 3 months for at risk foot care -Patient advised to call the office if any problems or questions arise in the meantime.   Louann Sjogren, DPM

## 2023-06-12 ENCOUNTER — Ambulatory Visit: Payer: Medicaid Other

## 2023-06-19 ENCOUNTER — Other Ambulatory Visit: Payer: Medicaid Other

## 2023-06-24 ENCOUNTER — Other Ambulatory Visit: Payer: Medicaid Other

## 2023-06-26 ENCOUNTER — Ambulatory Visit: Payer: Medicaid Other | Admitting: Orthopedic Surgery

## 2023-07-04 ENCOUNTER — Ambulatory Visit: Payer: Medicaid Other

## 2023-07-15 ENCOUNTER — Ambulatory Visit (INDEPENDENT_AMBULATORY_CARE_PROVIDER_SITE_OTHER): Payer: Medicaid Other

## 2023-07-15 DIAGNOSIS — E1165 Type 2 diabetes mellitus with hyperglycemia: Secondary | ICD-10-CM

## 2023-07-15 DIAGNOSIS — E1142 Type 2 diabetes mellitus with diabetic polyneuropathy: Secondary | ICD-10-CM

## 2023-07-15 DIAGNOSIS — M2041 Other hammer toe(s) (acquired), right foot: Secondary | ICD-10-CM

## 2023-07-15 DIAGNOSIS — M79674 Pain in right toe(s): Secondary | ICD-10-CM

## 2023-07-15 DIAGNOSIS — M2042 Other hammer toe(s) (acquired), left foot: Secondary | ICD-10-CM

## 2023-07-15 DIAGNOSIS — M79675 Pain in left toe(s): Secondary | ICD-10-CM

## 2023-07-15 DIAGNOSIS — B351 Tinea unguium: Secondary | ICD-10-CM

## 2023-07-15 NOTE — Progress Notes (Signed)
Patient presents today to pick up diabetic shoes and insoles.  Patient was dispensed 1 pair of diabetic shoes and 3 pairs of foam casted diabetic insoles. Fit was satisfactory. Instructions for break-in and wear was reviewed and a copy was given to the patient.   Re-appointment for regularly scheduled diabetic foot care visits or if they should experience any trouble with the shoes or insoles.  Patient was very happy with fit and function and will call if any problems arise  Addison Bailey Cped, CFo, CFm

## 2023-07-24 ENCOUNTER — Ambulatory Visit: Payer: Medicaid Other | Admitting: Physician Assistant

## 2023-07-24 ENCOUNTER — Ambulatory Visit: Payer: Medicaid Other | Admitting: Nurse Practitioner

## 2023-08-02 ENCOUNTER — Ambulatory Visit: Payer: No Typology Code available for payment source

## 2023-09-09 ENCOUNTER — Ambulatory Visit (INDEPENDENT_AMBULATORY_CARE_PROVIDER_SITE_OTHER): Payer: Medicaid Other | Admitting: Podiatry

## 2023-09-09 DIAGNOSIS — Z91199 Patient's noncompliance with other medical treatment and regimen due to unspecified reason: Secondary | ICD-10-CM

## 2023-09-09 NOTE — Progress Notes (Signed)
 No show

## 2023-09-18 DIAGNOSIS — R5383 Other fatigue: Secondary | ICD-10-CM | POA: Diagnosis not present

## 2023-09-18 DIAGNOSIS — R002 Palpitations: Secondary | ICD-10-CM | POA: Diagnosis not present

## 2023-09-18 DIAGNOSIS — R197 Diarrhea, unspecified: Secondary | ICD-10-CM | POA: Diagnosis not present

## 2023-09-19 DIAGNOSIS — R079 Chest pain, unspecified: Secondary | ICD-10-CM | POA: Diagnosis not present

## 2023-11-07 ENCOUNTER — Encounter (HOSPITAL_COMMUNITY): Payer: Self-pay

## 2023-11-07 ENCOUNTER — Emergency Department (HOSPITAL_COMMUNITY)
Admission: EM | Admit: 2023-11-07 | Discharge: 2023-11-08 | Disposition: A | Discharge status: Home / Self Care | Attending: Emergency Medicine | Admitting: Emergency Medicine

## 2023-11-07 ENCOUNTER — Other Ambulatory Visit: Payer: Self-pay

## 2023-11-07 DIAGNOSIS — R42 Dizziness and giddiness: Secondary | ICD-10-CM | POA: Diagnosis not present

## 2023-11-07 DIAGNOSIS — Z91040 Latex allergy status: Secondary | ICD-10-CM | POA: Insufficient documentation

## 2023-11-07 DIAGNOSIS — J45909 Unspecified asthma, uncomplicated: Secondary | ICD-10-CM | POA: Diagnosis not present

## 2023-11-07 DIAGNOSIS — Z7982 Long term (current) use of aspirin: Secondary | ICD-10-CM | POA: Insufficient documentation

## 2023-11-07 DIAGNOSIS — I10 Essential (primary) hypertension: Secondary | ICD-10-CM | POA: Diagnosis not present

## 2023-11-07 DIAGNOSIS — Z79899 Other long term (current) drug therapy: Secondary | ICD-10-CM | POA: Diagnosis not present

## 2023-11-07 DIAGNOSIS — Z7984 Long term (current) use of oral hypoglycemic drugs: Secondary | ICD-10-CM | POA: Insufficient documentation

## 2023-11-07 DIAGNOSIS — Z794 Long term (current) use of insulin: Secondary | ICD-10-CM | POA: Insufficient documentation

## 2023-11-07 DIAGNOSIS — Z8616 Personal history of COVID-19: Secondary | ICD-10-CM | POA: Insufficient documentation

## 2023-11-07 DIAGNOSIS — I480 Paroxysmal atrial fibrillation: Secondary | ICD-10-CM | POA: Diagnosis not present

## 2023-11-07 DIAGNOSIS — E119 Type 2 diabetes mellitus without complications: Secondary | ICD-10-CM | POA: Diagnosis not present

## 2023-11-07 LAB — URINALYSIS, ROUTINE W REFLEX MICROSCOPIC
Bilirubin Urine: NEGATIVE
Glucose, UA: 50 mg/dL — AB
Hgb urine dipstick: NEGATIVE
Ketones, ur: NEGATIVE mg/dL
Nitrite: NEGATIVE
Protein, ur: NEGATIVE mg/dL
Specific Gravity, Urine: 1.011 (ref 1.005–1.030)
pH: 5 (ref 5.0–8.0)

## 2023-11-07 LAB — CBC
HCT: 36.7 % (ref 36.0–46.0)
Hemoglobin: 12.8 g/dL (ref 12.0–15.0)
MCH: 29.8 pg (ref 26.0–34.0)
MCHC: 34.9 g/dL (ref 30.0–36.0)
MCV: 85.3 fL (ref 80.0–100.0)
Platelets: 226 10*3/uL (ref 150–400)
RBC: 4.3 MIL/uL (ref 3.87–5.11)
RDW: 12.8 % (ref 11.5–15.5)
WBC: 10.7 10*3/uL — ABNORMAL HIGH (ref 4.0–10.5)
nRBC: 0 % (ref 0.0–0.2)

## 2023-11-07 LAB — BASIC METABOLIC PANEL
Anion gap: 11 (ref 5–15)
BUN: 12 mg/dL (ref 6–20)
CO2: 23 mmol/L (ref 22–32)
Calcium: 9.1 mg/dL (ref 8.9–10.3)
Chloride: 106 mmol/L (ref 98–111)
Creatinine, Ser: 0.75 mg/dL (ref 0.44–1.00)
GFR, Estimated: >60 mL/min (ref >60)
Glucose, Bld: 267 mg/dL — ABNORMAL HIGH (ref 70–99)
Potassium: 3.9 mmol/L (ref 3.5–5.1)
Sodium: 140 mmol/L (ref 135–145)

## 2023-11-07 MED ORDER — MECLIZINE HCL 25 MG PO TABS
25.0000 mg | ORAL_TABLET | Freq: Once | ORAL | Status: AC
  Administered 2023-11-07: 25 mg via ORAL
  Filled 2023-11-07: qty 1

## 2023-11-07 NOTE — ED Triage Notes (Signed)
 Pt reports dizziness, headache and lightheadedness since yesterday, reports her balance feels off and woke up yesterday like this. Denies any other symptoms.

## 2023-11-07 NOTE — ED Notes (Signed)
 Pt given large pitcher of water, encouraged to drink entire pitcher

## 2023-11-07 NOTE — ED Provider Notes (Signed)
 [place] EMERGENCY DEPARTMENT AT [facility] Provider Note   CSN: [id number] Arrival date & time: 11/07/23  1647     History {Add pertinent medical, surgical, social history, OB history to HPI:1} Chief Complaint  Patient presents with   Dizziness    Anna Golden is a 63 y.o. female.  The history is provided by the patient.  Patient history of asthma, hypertension, diabetes presents with dizziness. Patient reports she woke up on the morning of March 12 feeling dizzy and lightheaded.  She reports she felt that her balance was off.  Since that time, she has had multiple episodes of nonbloody watery diarrhea.  She has had nausea without vomiting.  No chest pain or abdominal pain.  No fevers.  She does report mild headache and cough. No syncope, no seizures.  No focal weakness She reports compliance with her metformin, but did lose her insulin in a recent move   No recent travel Patient reports history of atrial fibrillation Past Medical History:  Diagnosis Date   Acute asthma exacerbation 03/07/2014   Arthritis    Asthma    COVID 04/25/2023   DKA (diabetic ketoacidoses) 03/07/2014   DM (diabetes mellitus) (HCC) 04/24/2013   Dysmenorrhea 01/23/2013   Fatty liver disease, nonalcoholic    Hypertension    Leiomyoma of uterus, unspecified 01/23/2013   Leukocytosis    Menorrhagia with regular cycle 01/23/2013   Obesity    PAF (paroxysmal atrial fibrillation) (HCC)    a. Dx 03/2013, spont converted to NSR.   Sickle cell trait (HCC)    Smoking 03/18/2014    Home Medications Prior to Admission medications   Medication Sig Start Date End Date Taking? Authorizing Provider  Accu-Chek Softclix Lancets lancets Use to check blood sugar twice daily. Patient not taking: Reported on 05/21/2023 04/25/23   Storm Frisk, MD  albuterol (PROVENTIL HFA) [phone] Base) MCG/ACT inhaler Inhale 2 puffs into the lungs every 6 (six) hours as needed for wheezing or shortness of  breath. Patient not taking: Reported on 05/21/2023 04/25/23   Storm Frisk, MD  aspirin EC 81 MG tablet Take 1 tablet (81 mg total) by mouth daily. Swallow whole. Patient not taking: Reported on 05/21/2023 11/02/20   Storm Frisk, MD  atorvastatin (LIPITOR) 40 MG tablet Take 1 tablet (40 mg total) by mouth daily. 05/21/23   Storm Frisk, MD  Blood Glucose Monitoring Suppl (ACCU-CHEK GUIDE) w/Device KIT Testing twice daily. 12/24/22   Storm Frisk, MD  diltiazem (CARDIZEM CD) 120 MG 24 hr capsule Take 1 capsule (120 mg total) by mouth daily. 05/21/23   Storm Frisk, MD  empagliflozin (JARDIANCE) 10 MG TABS tablet Take 1 tablet (10 mg total) by mouth daily before breakfast. 05/21/23   Storm Frisk, MD  glucose blood (TRUE METRIX BLOOD GLUCOSE TEST) test strip Use as instructed Patient not taking: Reported on 05/21/2023 04/25/23   Storm Frisk, MD  insulin glargine (LANTUS SOLOSTAR) 100 UNIT/ML Solostar Pen Inject 45 Units into the skin daily. 05/21/23   Storm Frisk, MD  Insulin Pen Needle (TRUEPLUS 5-BEVEL PEN NEEDLES) 32G X 4 MM MISC Use to inject insulin once daily. 05/21/23   Storm Frisk, MD  metFORMIN (GLUCOPHAGE-XR) 500 MG 24 hr tablet Take 2 tablets (1,000 mg total) by mouth 2 (two) times daily. 05/21/23   Storm Frisk, MD  gabapentin (NEURONTIN) 300 MG capsule Take 1 capsule (300 mg total) by mouth 3 (three) times daily. Patient  not taking: No sig reported 08/01/20 11/02/20  Storm Frisk, MD  insulin aspart protamine- aspart (NOVOLOG MIX 70/30) (70-30) 100 UNIT/ML injection Inject 0.08 mLs (8 Units total) into the skin 2 (two) times daily with a meal. Patient not taking: Reported on 01/15/2015 04/26/14 01/26/15  Doris Cheadle, MD  insulin lispro (HUMALOG) 100 UNIT/ML KwikPen 5 units twice a day with two largest meals of the day Patient not taking: Reported on 11/02/2020 08/01/20 11/02/20  Storm Frisk, MD  potassium chloride (KLOR-CON) 10 MEQ tablet Take 1  tablet (10 mEq total) by mouth daily for 21 days. Patient not taking: Reported on 11/02/2020 08/01/20 11/02/20  Storm Frisk, MD      Allergies    Metoprolol, Other, and Latex    Review of Systems   Review of Systems  Constitutional:  Positive for fatigue. Negative for fever.  Respiratory:  Positive for cough. Negative for shortness of breath.   Cardiovascular:  Negative for chest pain.  Gastrointestinal:  Positive for diarrhea and nausea. Negative for abdominal pain, anal bleeding, blood in stool and vomiting.  Neurological:  Positive for headaches. Negative for seizures, syncope and speech difficulty.    Physical Exam Updated Vital Signs BP 138/72 (BP Location: Right Arm)   Pulse 69   Temp 98.3 F (36.8 C)   Resp 15   Ht 1.6 m (5\' 3" )   Wt 81.2 kg   LMP 12/20/2014   SpO2 100%   BMI 31.71 kg/m  Physical Exam CONSTITUTIONAL: Well developed/well nourished, no distress talking on the phone HEAD: Normocephalic/atraumatic EYES: EOMI/PERRL, no nystagmus, no ptosis ENMT: Mucous membranes moist NECK: supple no meningeal signs, no bruits CV: S1/S2 noted, no murmurs/rubs/gallops noted LUNGS: Lungs are clear to auscultation bilaterally, no apparent distress ABDOMEN: soft, nontender, no rebound or guarding GU:no cva tenderness NEURO:Awake/alert, face symmetric, no arm or leg drift is noted Equal 5/5 strength with shoulder abduction, elbow flex/extension, wrist flex/extension in upper extremities and equal hand grips bilaterally Equal 5/5 strength with hip flexion,knee flex/extension, foot dorsi/plantar flexion Cranial nerves 3/4/5/6/03/04/09/11/12 tested and intact No past pointing Sensation to light touch intact in all extremities Patient is able to stand, but reports dizziness upon standing and then sits back down EXTREMITIES: pulses normalx4, full ROM No deformities SKIN: warm, color normal PSYCH: no abnormalities of mood noted  ED Results / Procedures / Treatments    Labs (all labs ordered are listed, but only abnormal results are displayed) Labs Reviewed  BASIC METABOLIC PANEL - Abnormal; Notable for the following components:      Result Value   Glucose, Bld 267 (*)    All other components within normal limits  CBC - Abnormal; Notable for the following components:   WBC 10.7 (*)    All other components within normal limits  URINALYSIS, ROUTINE W REFLEX MICROSCOPIC - Abnormal; Notable for the following components:   APPearance HAZY (*)    Glucose, UA 50 (*)    Leukocytes,Ua TRACE (*)    Bacteria, UA RARE (*)    All other components within normal limits    EKG EKG Interpretation Date/Time:  Thursday November 07 2023 17:11:32 EDT Ventricular Rate:  84 PR Interval:  128 QRS Duration:  72 QT Interval:  382 QTC Calculation: 451 R Axis:   14  Text Interpretation: Normal sinus rhythm Cannot rule out Anterior infarct , age undetermined Abnormal ECG No significant change since last tracing Interpretation limited secondary to artifact Confirmed by Zadie Rhine ([id number]) on 11/07/2023 11:17:29  PM  Radiology No results found.  Procedures Procedures  {Document cardiac monitor, telemetry assessment procedure when appropriate:1}  Medications Ordered in ED Medications  meclizine (ANTIVERT) tablet 25 mg (has no administration in time range)    ED Course/ Medical Decision Making/ A&P   {   Click here for ABCD2, HEART and other calculatorsREFRESH Note before signing :1}                              Medical Decision Making Amount and/or Complexity of Data Reviewed Labs: ordered.   This patient presents to the ED for concern of dizziness, this involves an extensive number of treatment options, and is a complaint that carries with it a high risk of complications and morbidity.  The differential diagnosis includes but is not limited to CVA, intracranial hemorrhage, acute coronary syndrome, renal failure, urinary tract infection, electrolyte  disturbance, pneumonia Viral illness, enteritis  Comorbidities that complicate the patient evaluation: Patient's presentation is complicated by their history of hypertension, diabetes  Social Determinants of Health: Patient's lack of prescription access  increases the complexity of managing their presentation  Additional history obtained: Records reviewed  outpatient records reviewed  Lab Tests: I Ordered, and personally interpreted labs.  The pertinent results include: Mild leukocytosis, hyperglycemia without anion gap  Imaging Studies ordered: I ordered imaging studies including {imaging:26848}  I independently visualized and interpreted imaging which showed *** I agree with the radiologist interpretation  Cardiac Monitoring: The patient was maintained on a cardiac monitor.  I personally viewed and interpreted the cardiac monitor which showed an underlying rhythm of:  {cardiac monitor:26849}  Medicines ordered and prescription drug management: I ordered medication including Antivert for dizziness Reevaluation of the patient after these medicines showed that the patient    {resolved/improved/worsened:23923::"improved"}  Test Considered: Patient is low risk / negative by ***, therefore do not feel that *** is indicated.  Critical Interventions:  ***  Consultations Obtained: I requested consultation with the {consultation:26851}, and discussed  findings as well as pertinent plan - they recommend: ***  Reevaluation: After the interventions noted above, I reevaluated the patient and found that they have :{resolved/improved/worsened:23923::"improved"}  Complexity of problems addressed: Patient's presentation is most consistent with  acute presentation with potential threat to life or bodily function  Disposition: After consideration of the diagnostic results and the patient's response to treatment,  I feel that the patent would benefit from {disposition:26850}.      {Document critical care time when appropriate:1} {Document review of labs and clinical decision tools ie heart score, Chads2Vasc2 etc:1}  {Document your independent review of radiology images, and any outside records:1} {Document your discussion with family members, caretakers, and with consultants:1} {Document social determinants of health affecting pt's care:1} {Document your decision making why or why not admission, treatments were needed:1} Final Clinical Impression(s) / ED Diagnoses Final diagnoses:  None    Rx / DC Orders ED Discharge Orders     None

## 2023-11-08 ENCOUNTER — Other Ambulatory Visit: Payer: Self-pay

## 2023-11-08 MED ORDER — MECLIZINE HCL 25 MG PO TABS
25.0000 mg | ORAL_TABLET | Freq: Two times a day (BID) | ORAL | 0 refills | Status: AC | PRN
  Filled 2023-11-08: qty 20, 10d supply, fill #0

## 2023-11-08 NOTE — ED Notes (Signed)
Pt tolerating food.

## 2023-11-08 NOTE — Discharge Instructions (Addendum)
   RETURN IMMEDIATELY IF YOU HAVE ANY OF THE FOLLOWING (call 911): Increasing vertigo, earache, ear drainage, or loss of hearing.  Severe headache, blurred or double vision, or trouble walking.  Fainting or poorly responsive, extreme weakness, chest pain, or palpitations.  Fever, persistent vomiting, or dehydration.  Numbness, tingling, incoordination, or weakness of the limbs.  Change in speech, vision, swallowing, understanding, or other concerns.  

## 2023-11-08 NOTE — ED Notes (Signed)
 Patient verbalizes understanding of discharge instructions. Opportunity for questioning and answers were provided. Armband removed by staff, pt discharged from ED. Wheeled to lobby, awaiting her son to pick her up

## 2023-11-08 NOTE — ED Notes (Signed)
 Pt ambulated to restroom independently, reports improvement of dizziness and vertigo

## 2024-01-14 ENCOUNTER — Encounter (HOSPITAL_COMMUNITY): Payer: Self-pay | Admitting: Emergency Medicine

## 2024-01-14 ENCOUNTER — Other Ambulatory Visit: Payer: Self-pay

## 2024-01-14 ENCOUNTER — Emergency Department (HOSPITAL_COMMUNITY)

## 2024-01-14 ENCOUNTER — Emergency Department (HOSPITAL_COMMUNITY)
Admission: EM | Admit: 2024-01-14 | Discharge: 2024-01-14 | Disposition: A | Discharge status: Home / Self Care | Attending: Emergency Medicine | Admitting: Emergency Medicine

## 2024-01-14 DIAGNOSIS — Z7982 Long term (current) use of aspirin: Secondary | ICD-10-CM | POA: Diagnosis not present

## 2024-01-14 DIAGNOSIS — E119 Type 2 diabetes mellitus without complications: Secondary | ICD-10-CM | POA: Insufficient documentation

## 2024-01-14 DIAGNOSIS — Z794 Long term (current) use of insulin: Secondary | ICD-10-CM | POA: Insufficient documentation

## 2024-01-14 DIAGNOSIS — R600 Localized edema: Secondary | ICD-10-CM | POA: Insufficient documentation

## 2024-01-14 DIAGNOSIS — Z91040 Latex allergy status: Secondary | ICD-10-CM | POA: Insufficient documentation

## 2024-01-14 DIAGNOSIS — I10 Essential (primary) hypertension: Secondary | ICD-10-CM | POA: Insufficient documentation

## 2024-01-14 LAB — COMPREHENSIVE METABOLIC PANEL WITH GFR
ALT: 16 U/L (ref 0–44)
AST: 16 U/L (ref 15–41)
Albumin: 3.5 g/dL (ref 3.5–5.0)
Alkaline Phosphatase: 36 U/L — ABNORMAL LOW (ref 38–126)
Anion gap: 9 (ref 5–15)
BUN: 10 mg/dL (ref 8–23)
CO2: 25 mmol/L (ref 22–32)
Calcium: 9.2 mg/dL (ref 8.9–10.3)
Chloride: 105 mmol/L (ref 98–111)
Creatinine, Ser: 0.76 mg/dL (ref 0.44–1.00)
GFR, Estimated: >60 mL/min (ref >60)
Glucose, Bld: 318 mg/dL — ABNORMAL HIGH (ref 70–99)
Potassium: 3.9 mmol/L (ref 3.5–5.1)
Sodium: 139 mmol/L (ref 135–145)
Total Bilirubin: 0.3 mg/dL (ref 0.0–1.2)
Total Protein: 6.9 g/dL (ref 6.5–8.1)

## 2024-01-14 LAB — CBC
HCT: 37.5 % (ref 36.0–46.0)
Hemoglobin: 12.8 g/dL (ref 12.0–15.0)
MCH: 29.9 pg (ref 26.0–34.0)
MCHC: 34.1 g/dL (ref 30.0–36.0)
MCV: 87.6 fL (ref 80.0–100.0)
Platelets: 224 10*3/uL (ref 150–400)
RBC: 4.28 MIL/uL (ref 3.87–5.11)
RDW: 12.5 % (ref 11.5–15.5)
WBC: 8.6 10*3/uL (ref 4.0–10.5)
nRBC: 0 % (ref 0.0–0.2)

## 2024-01-14 LAB — BRAIN NATRIURETIC PEPTIDE: B Natriuretic Peptide: 8.3 pg/mL (ref 0.0–100.0)

## 2024-01-14 NOTE — ED Provider Notes (Signed)
 [place] EMERGENCY DEPARTMENT AT [facility] Provider Note   CSN: [id number] Arrival date & time: 01/14/24  1156     History  Chief Complaint  Patient presents with   Leg Swelling    Anna Golden is a 63 y.o. female past medical history significant for hypertension, obesity, diabetes, hyperlipidemia presents with concern for bilateral feet and leg swelling.  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  No previous history of heart failure, kidney disease.  She does report that she has had an enlarged liver diagnosed before.  HPI     Home Medications Prior to Admission medications   Medication Sig Start Date End Date Taking? Authorizing Provider  Accu-Chek Softclix Lancets lancets Use to check blood sugar twice daily. Patient not taking: Reported on 05/21/2023 04/25/23   Vernell Goldsmith, MD  albuterol  (PROVENTIL  HFA) 108 [phone] Base) MCG/ACT inhaler Inhale 2 puffs into the lungs every 6 (six) hours as needed for wheezing or shortness of breath. Patient not taking: Reported on 05/21/2023 04/25/23   Vernell Goldsmith, MD  aspirin  EC 81 MG tablet Take 1 tablet (81 mg total) by mouth daily. Swallow whole. Patient not taking: Reported on 05/21/2023 11/02/20   Vernell Goldsmith, MD  atorvastatin  (LIPITOR) 40 MG tablet Take 1 tablet (40 mg total) by mouth daily. 05/21/23   Vernell Goldsmith, MD  Blood Glucose Monitoring Suppl (ACCU-CHEK GUIDE) w/Device KIT Testing twice daily. 12/24/22   Vernell Goldsmith, MD  diltiazem  (CARDIZEM  CD) 120 MG 24 hr capsule Take 1 capsule (120 mg total) by mouth daily. 05/21/23   Vernell Goldsmith, MD  empagliflozin  (JARDIANCE ) 10 MG TABS tablet Take 1 tablet (10 mg total) by mouth daily before breakfast. 05/21/23   Vernell Goldsmith, MD  glucose blood (TRUE METRIX BLOOD GLUCOSE TEST) test strip Use as instructed Patient not taking: Reported on  05/21/2023 04/25/23   Vernell Goldsmith, MD  insulin  glargine (LANTUS  SOLOSTAR) 100 UNIT/ML Solostar Pen Inject 45 Units into the skin daily. 05/21/23   Vernell Goldsmith, MD  Insulin  Pen Needle (TRUEPLUS 5-BEVEL PEN NEEDLES) 32G X 4 MM MISC Use to inject insulin  once daily. 05/21/23   Vernell Goldsmith, MD  meclizine  (ANTIVERT ) 25 MG tablet Take 1 tablet (25 mg total) by mouth 2 (two) times daily as needed for dizziness. 11/08/23   Eldon Greenland, MD  metFORMIN  (GLUCOPHAGE -XR) 500 MG 24 hr tablet Take 2 tablets (1,000 mg total) by mouth 2 (two) times daily. 05/21/23   Vernell Goldsmith, MD  gabapentin  (NEURONTIN ) 300 MG capsule Take 1 capsule (300 mg total) by mouth 3 (three) times daily. Patient not taking: No sig reported 08/01/20 11/02/20  Vernell Goldsmith, MD  insulin  aspart protamine- aspart (NOVOLOG  MIX 70/30) (70-30) 100 UNIT/ML injection Inject 0.08 mLs (8 Units total) into the skin 2 (two) times daily with a meal. Patient not taking: Reported on 01/15/2015 04/26/14 01/26/15  Advani, Deepak, MD  insulin  lispro (HUMALOG ) 100 UNIT/ML KwikPen 5 units twice a day with two largest meals of the day Patient not taking: Reported on 11/02/2020 08/01/20 11/02/20  Vernell Goldsmith, MD  potassium chloride  (KLOR-CON ) 10 MEQ tablet Take 1 tablet (10 mEq total) by mouth daily for 21 days. Patient not taking: Reported on 11/02/2020 08/01/20 11/02/20  Vernell Goldsmith, MD      Allergies  Metoprolol , Other, and Latex    Review of Systems   Review of Systems  All other systems reviewed and are negative.   Physical Exam Updated Vital Signs BP 137/73 (BP Location: Left Arm)   Pulse 88   Temp 98.3 F (36.8 C)   Resp 18   Wt 83 kg   LMP 12/20/2014   SpO2 96%   BMI 32.41 kg/m  Physical Exam Vitals and nursing note reviewed.  Constitutional:      General: She is not in acute distress.    Appearance: Normal appearance.  HENT:     Head: Normocephalic and atraumatic.  Eyes:     General:        Right eye: No  discharge.        Left eye: No discharge.  Cardiovascular:     Rate and Rhythm: Normal rate and regular rhythm.     Pulses: Normal pulses.     Heart sounds: No murmur heard.    No friction rub. No gallop.  Pulmonary:     Effort: Pulmonary effort is normal.     Breath sounds: Normal breath sounds.  Abdominal:     General: Bowel sounds are normal.     Palpations: Abdomen is soft.  Musculoskeletal:     Comments: Around 1+ swelling of bilateral lower extremities, equal on both sides, some tenderness to palpation.  Skin:    General: Skin is warm and dry.     Capillary Refill: Capillary refill takes less than 2 seconds.  Neurological:     Mental Status: She is alert and oriented to person, place, and time.  Psychiatric:        Mood and Affect: Mood normal.        Behavior: Behavior normal.     ED Results / Procedures / Treatments   Labs (all labs ordered are listed, but only abnormal results are displayed) Labs Reviewed  COMPREHENSIVE METABOLIC PANEL WITH GFR - Abnormal; Notable for the following components:      Result Value   Glucose, Bld 318 (*)    Alkaline Phosphatase 36 (*)    All other components within normal limits  CBC  BRAIN NATRIURETIC PEPTIDE    EKG None  Radiology DG Chest 1 View Result Date: 01/14/2024 CLINICAL DATA:  Shortness of breath. Bilateral foot and leg swelling. EXAM: CHEST  1 VIEW COMPARISON:  04/10/2023 FINDINGS: Normal-sized heart. Clear lungs with normal vascularity. Thoracic and lumbar spine degenerative changes. IMPRESSION: No acute abnormality. Electronically Signed   By: Catherin Closs M.D.   On: 01/14/2024 15:37    Procedures Procedures    Medications Ordered in ED Medications - No data to display  ED Course/ Medical Decision Making/ A&P                                 Medical Decision Making  This patient is a 63 y.o. female who presents to the ED for concern of leg swelling, leg pain.   Differential diagnoses prior to  evaluation: Heart failure, liver failure, kidney disease, versus dependent edema, also considered DVT, cellulitis, or other vascular abnormality.  Past Medical History / Social History / Additional history: Chart reviewed. Pertinent results include: hypertension, obesity, diabetes, hyperlipidemia  Physical Exam: Physical exam performed. The pertinent findings include:  Around 1+ swelling of bilateral lower extremities, equal on both sides, some tenderness to palpation.   Vital signs stable, DP, PT pulses are  equal, 2+ bilaterally.  I reviewed lab work, imaging, unremarkable CBC, normal BNP, CMP is notable for elevated glucose at 318, consistent with patient's diabetes, she has no anion gap, lab work otherwise normal.  Encouraged home diabetes medications.  I independently interpreted plain film chest x-ray which shows no evidence of acute intrathoracic abnormality.  Medications / Treatment: Discussed with patient that given the time that she spends on her feet I suspect that she is having some dependent edema, encouraged compression stockings, ibuprofen , Tylenol  as needed for pain, encouraged PCP follow-up as needed.   Disposition: After consideration of the diagnostic results and the patients response to treatment, I feel that patient stable for discharge with plan as above .   emergency department workup does not suggest an emergent condition requiring admission or immediate intervention beyond what has been performed at this time. The plan is: as above. The patient is safe for discharge and has been instructed to return immediately for worsening symptoms, change in symptoms or any other concerns.  Final Clinical Impression(s) / ED Diagnoses Final diagnoses:  Peripheral edema    Rx / DC Orders ED Discharge Orders     None         Nelly Banco, PA-C 01/14/24 1821    Hershel Los, MD 01/15/24 [phone]

## 2024-01-14 NOTE — ED Triage Notes (Signed)
 Patient endorses bilateral feet and leg swelling and bed sores on her bottom d/t sleeping in a recliner x 3 weeks.

## 2024-01-14 NOTE — Discharge Instructions (Signed)
 Please use Tylenol  or ibuprofen  for pain.  You may use 600 mg ibuprofen  every 6 hours or 1000 mg of Tylenol  every 6 hours.  You may choose to alternate between the 2.  This would be most effective.  Not to exceed 4 g of Tylenol  within 24 hours.  Not to exceed 3200 mg ibuprofen  24 hours.  I have attached some information about how to use compression stockings.  I have put a work note at the end of your paperwork.  Please return if you have any concern for worsening leg swelling or pain despite treatment.

## 2024-01-14 NOTE — ED Provider Triage Note (Signed)
 Emergency Medicine Provider Triage Evaluation Note  Anna Golden , a 63 y.o. female  was evaluated in triage.  Pt complains of BLE worse for 2-3 weeks, some very mild int SOB with walking. No CP, cough, fever.   Review of Systems  Positive: Swelling, sore on bottom Negative: fever  Physical Exam  BP 117/72 (BP Location: Right Arm)   Pulse 94   Temp 98.3 F (36.8 C) (Oral)   Resp 16   Wt 83 kg   LMP 12/20/2014   SpO2 99%   BMI 32.41 kg/m  Gen:   Awake, no distress   Resp:  Normal effort  MSK:   Moves extremities without difficulty  Other:  BLE  Medical Decision Making  Medically screening exam initiated at 1:17 PM.  Appropriate orders placed.  Anna Golden was informed that the remainder of the evaluation will be completed by another provider, this initial triage assessment does not replace that evaluation, and the importance of remaining in the ED until their evaluation is complete.     Eudora Heron, PA-C 01/14/24 1318

## 2024-01-19 ENCOUNTER — Encounter (HOSPITAL_COMMUNITY): Payer: Self-pay

## 2024-01-19 ENCOUNTER — Emergency Department (HOSPITAL_COMMUNITY)
Admission: EM | Admit: 2024-01-19 | Discharge: 2024-01-20 | Disposition: A | Discharge status: Home / Self Care | Attending: Emergency Medicine | Admitting: Emergency Medicine

## 2024-01-19 ENCOUNTER — Other Ambulatory Visit: Payer: Self-pay

## 2024-01-19 DIAGNOSIS — J45909 Unspecified asthma, uncomplicated: Secondary | ICD-10-CM | POA: Insufficient documentation

## 2024-01-19 DIAGNOSIS — Z7982 Long term (current) use of aspirin: Secondary | ICD-10-CM | POA: Diagnosis not present

## 2024-01-19 DIAGNOSIS — Z7984 Long term (current) use of oral hypoglycemic drugs: Secondary | ICD-10-CM | POA: Insufficient documentation

## 2024-01-19 DIAGNOSIS — Z79899 Other long term (current) drug therapy: Secondary | ICD-10-CM | POA: Diagnosis not present

## 2024-01-19 DIAGNOSIS — Z794 Long term (current) use of insulin: Secondary | ICD-10-CM | POA: Insufficient documentation

## 2024-01-19 DIAGNOSIS — T551X1A Toxic effect of detergents, accidental (unintentional), initial encounter: Secondary | ICD-10-CM | POA: Insufficient documentation

## 2024-01-19 DIAGNOSIS — T189XXA Foreign body of alimentary tract, part unspecified, initial encounter: Secondary | ICD-10-CM

## 2024-01-19 DIAGNOSIS — I10 Essential (primary) hypertension: Secondary | ICD-10-CM | POA: Insufficient documentation

## 2024-01-19 DIAGNOSIS — E119 Type 2 diabetes mellitus without complications: Secondary | ICD-10-CM | POA: Insufficient documentation

## 2024-01-19 DIAGNOSIS — Z91040 Latex allergy status: Secondary | ICD-10-CM | POA: Insufficient documentation

## 2024-01-19 MED ORDER — ALUM & MAG HYDROXIDE-SIMETH 200-200-20 MG/5ML PO SUSP
30.0000 mL | Freq: Once | ORAL | Status: AC
  Administered 2024-01-19: 30 mL via ORAL
  Filled 2024-01-19: qty 30

## 2024-01-19 MED ORDER — ONDANSETRON 4 MG PO TBDP
4.0000 mg | ORAL_TABLET | Freq: Once | ORAL | Status: AC
  Administered 2024-01-19: 4 mg via ORAL
  Filled 2024-01-19: qty 1

## 2024-01-19 NOTE — ED Triage Notes (Signed)
 Pt complaining of drinking an all purpose  cleaner "awesome." She said there was some in a water bottle and she got it in her mouth. Said her throat is burning

## 2024-01-19 NOTE — ED Provider Notes (Signed)
 [place] EMERGENCY DEPARTMENT AT [facility] Provider Note   CSN: [id number] Arrival date & time: 01/19/24  1922     History {Add pertinent medical, surgical, social history, OB history to HPI:1} Chief Complaint  Patient presents with   Ingestion    Anna Golden is a 63 y.o. female.  The history is provided by the patient and medical records.  Ingestion   63 year old female with history of hypertension, asthma, paroxysmal A-fib, diabetes, fatty liver, presenting to the ED after accidentally drinking some awesome cleaner.  States her roommate had put the rest of it in a water bottle on a side table.  Patient also had a bottle of water nearby.  States she was watching tv and just reached back to grab a bottle and grabbed the wrong one.  Only drank a small swallow of cleaner before she realized.  No vomiting.  States she is not having any trouble swallowing or significant pain in the throat but feels like she has some acid reflux and some nausea.  Adamantly denies any attempted self harm.  No SI/HI.  Home Medications Prior to Admission medications   Medication Sig Start Date End Date Taking? Authorizing Provider  Accu-Chek Softclix Lancets lancets Use to check blood sugar twice daily. Patient not taking: Reported on 05/21/2023 04/25/23   Vernell Goldsmith, MD  albuterol  (PROVENTIL  HFA) 108 [phone] Base) MCG/ACT inhaler Inhale 2 puffs into the lungs every 6 (six) hours as needed for wheezing or shortness of breath. Patient not taking: Reported on 05/21/2023 04/25/23   Vernell Goldsmith, MD  aspirin  EC 81 MG tablet Take 1 tablet (81 mg total) by mouth daily. Swallow whole. Patient not taking: Reported on 05/21/2023 11/02/20   Vernell Goldsmith, MD  atorvastatin  (LIPITOR) 40 MG tablet Take 1 tablet (40 mg total) by mouth daily. 05/21/23   Vernell Goldsmith, MD  Blood Glucose Monitoring Suppl (ACCU-CHEK GUIDE) w/Device KIT Testing twice daily. 12/24/22   Vernell Goldsmith, MD  diltiazem   (CARDIZEM  CD) 120 MG 24 hr capsule Take 1 capsule (120 mg total) by mouth daily. 05/21/23   Vernell Goldsmith, MD  empagliflozin  (JARDIANCE ) 10 MG TABS tablet Take 1 tablet (10 mg total) by mouth daily before breakfast. 05/21/23   Vernell Goldsmith, MD  glucose blood (TRUE METRIX BLOOD GLUCOSE TEST) test strip Use as instructed Patient not taking: Reported on 05/21/2023 04/25/23   Vernell Goldsmith, MD  insulin  glargine (LANTUS  SOLOSTAR) 100 UNIT/ML Solostar Pen Inject 45 Units into the skin daily. 05/21/23   Vernell Goldsmith, MD  Insulin  Pen Needle (TRUEPLUS 5-BEVEL PEN NEEDLES) 32G X 4 MM MISC Use to inject insulin  once daily. 05/21/23   Vernell Goldsmith, MD  meclizine  (ANTIVERT ) 25 MG tablet Take 1 tablet (25 mg total) by mouth 2 (two) times daily as needed for dizziness. 11/08/23   Eldon Greenland, MD  metFORMIN  (GLUCOPHAGE -XR) 500 MG 24 hr tablet Take 2 tablets (1,000 mg total) by mouth 2 (two) times daily. 05/21/23   Vernell Goldsmith, MD  gabapentin  (NEURONTIN ) 300 MG capsule Take 1 capsule (300 mg total) by mouth 3 (three) times daily. Patient not taking: No sig reported 08/01/20 11/02/20  Vernell Goldsmith, MD  insulin  aspart protamine- aspart (NOVOLOG  MIX 70/30) (70-30) 100 UNIT/ML injection Inject 0.08 mLs (8 Units total) into the skin 2 (two) times daily with a meal. Patient not taking: Reported on 01/15/2015 04/26/14 01/26/15  Advani, Deepak, MD  insulin  lispro (HUMALOG ) 100 UNIT/ML  KwikPen 5 units twice a day with two largest meals of the day Patient not taking: Reported on 11/02/2020 08/01/20 11/02/20  Vernell Goldsmith, MD  potassium chloride  (KLOR-CON ) 10 MEQ tablet Take 1 tablet (10 mEq total) by mouth daily for 21 days. Patient not taking: Reported on 11/02/2020 08/01/20 11/02/20  Vernell Goldsmith, MD      Allergies    Metoprolol , Other, and Latex    Review of Systems   Review of Systems  Gastrointestinal:  Positive for nausea.       GERD  All other systems reviewed and are  negative.   Physical Exam Updated Vital Signs BP 130/65 (BP Location: Right Arm)   Pulse 81   Temp 98.2 F (36.8 C)   Resp 18   Ht 5\' 3"  (1.6 m)   Wt 82.6 kg   LMP 12/20/2014   SpO2 100%   BMI 32.24 kg/m   Physical Exam Vitals and nursing note reviewed.  Constitutional:      Appearance: She is well-developed.  HENT:     Head: Normocephalic and atraumatic.     Mouth/Throat:     Comments: Oropharynx is clear, no evidence of bleeding, open lesion, ulceration, or other abnormal findings, handling secretions well, normal phonation without stridor Eyes:     Conjunctiva/sclera: Conjunctivae normal.     Pupils: Pupils are equal, round, and reactive to light.  Cardiovascular:     Rate and Rhythm: Normal rate and regular rhythm.     Heart sounds: Normal heart sounds.  Pulmonary:     Effort: Pulmonary effort is normal.     Breath sounds: Normal breath sounds.  Musculoskeletal:        General: Normal range of motion.     Cervical back: Normal range of motion.  Skin:    General: Skin is warm and dry.  Neurological:     Mental Status: She is alert and oriented to person, place, and time.     ED Results / Procedures / Treatments   Labs (all labs ordered are listed, but only abnormal results are displayed) Labs Reviewed - No data to display  EKG None  Radiology No results found.  Procedures Procedures  {Document cardiac monitor, telemetry assessment procedure when appropriate:1}  Medications Ordered in ED Medications - No data to display  ED Course/ Medical Decision Making/ A&P   {   Click here for ABCD2, HEART and other calculatorsREFRESH Note before signing :1}                              Medical Decision Making Risk OTC drugs. Prescription drug management.   ***  {Document critical care time when appropriate:1} {Document review of labs and clinical decision tools ie heart score, Chads2Vasc2 etc:1}  {Document your independent review of radiology images,  and any outside records:1} {Document your discussion with family members, caretakers, and with consultants:1} {Document social determinants of health affecting pt's care:1} {Document your decision making why or why not admission, treatments were needed:1} Final Clinical Impression(s) / ED Diagnoses Final diagnoses:  None    Rx / DC Orders ED Discharge Orders     None

## 2024-01-19 NOTE — ED Notes (Signed)
 Pt assessed by this RN - no drooling noted - she states that she has a "nasty taste" in her mouth and feels gassy but no pain noted to her mouth nor throat.

## 2024-01-19 NOTE — ED Notes (Signed)
 Called poison control back and gave update. They have released her from their standpoint.

## 2024-01-19 NOTE — ED Notes (Signed)
 Called poison control and they recommended to watch patient for 2 hours for drooling, difficulty swallowing, and vomiting. If none of that occurs then patient will be ok to be discharged. If it does she needs to be re-evaluated.

## 2024-01-20 MED ORDER — ONDANSETRON 4 MG PO TBDP: 4.0000 mg | ORAL_TABLET | Freq: Three times a day (TID) | ORAL | 0 refills | Status: DC | PRN

## 2024-01-20 MED ORDER — ALUMINUM-MAGNESIUM-SIMETHICONE 200-200-20 MG/5ML PO SUSP
30.0000 mL | Freq: Three times a day (TID) | ORAL | 0 refills | Status: AC
  Filled 2024-01-20: qty 500, 4d supply, fill #0

## 2024-01-20 MED ORDER — ONDANSETRON 4 MG PO TBDP
4.0000 mg | ORAL_TABLET | Freq: Three times a day (TID) | ORAL | 0 refills | Status: AC | PRN
  Filled 2024-01-20: qty 10, 30d supply, fill #0
  Filled 2024-02-04 (×2): qty 10, 4d supply, fill #0

## 2024-01-20 NOTE — Progress Notes (Deleted)
 Established Patient Office Visit  Subjective:  Patient ID: Anna Golden, female    DOB: [birth year]  Age: 63 y.o. MRN: [id number]  Chief complaint follow-up chronic disease management   HPI 10/2021 Anna Golden presents for primary care to reestablish with prior history of hypertension and type 2 diabetes.  This patient has difficulty with using needles for insulin  and been trying to manage her orally.  We tried on Farxiga  but she did not tolerate this well.  She is only been taking metformin  and she misses many doses.  Have not seen her since March 2022.  She is due a variety of primary care screenings  On arrival blood sugar 326 blood pressure 133/68.  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  She does not follow a healthy diet.  She fell February 6 because of weakness and was found to be volume depleted.  She was given a liter of saline had a series of x-rays which were negative.  She still has some soreness in the right hip and in the shoulders.  As stated she cannot tolerate Farxiga  because of side effects.  She is complaining of right upper quadrant abdominal pain and thyromegaly.  She is still smoking a pack every 2 days of cigarettes.  She does not drink alcohol.  Patient also complains of bilateral wrist pain and weakness in the hands.  She also has frequent falls at home as well.  She states she has difficulty walking because of neuropathy in the feet.  The patient has a cane at home but she rarely uses this.  Patient's had physical therapy when she lived in [place]  but she declines to receive this again.  She does not have any insurance she is working on [employer].  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  I am under the treatment of this patient.  Patient does need special shoes because of  her significant diabetes.  Today is the most recent telehealth visit with diabetic management to be discussed.  Also this patient has developed COVID like illness symptoms began 2 days ago she went to the emergency room on August 26 they did not give her any treatments she significantly high risk due to her diabetes and other chronic medical conditions.    She is having increased cough loss of taste and smell headaches.  Blood sugars are improved.  She has been followed by clinical pharmacy and recently had her medications adjusted.  She has fallen previously and needs orthopedic follow-up.  Patient has a lumbar radiculopathy MRI recently done showed abnormalities orthopedics yet to follow-up Last visit as asked below with pharmacy Diabetes: - Currently uncontrolled - Reviewed long term cardiovascular and renal outcomes of uncontrolled blood sugar - Reviewed goal A1c, goal fasting, and goal 2 hour post prandial glucose - Reviewed dietary modifications including MyPlate - Reviewed lifestyle modifications including: 150 minutes/week of aerobic exercise - Recommend to resume medications.    05/21/23 This patient is following up for significant diabetes on arrival A1c is 8.5 which is an increase she remains depressed at times and stopped taking her medication she has not been on any of her medicines for 3 days.  Blood sugar today is elevated.  She will need follow-up with clinical pharmacy.  She has a loop recorder and is needs follow-up with cardiology.  She has history of atrial fibrillation.  Recently had COVID as well.  She is recover from the COVID has still a mild cough.  01/23/2024 Edema in legs seen ED 01/14/24 w/u negative  then 01/19/24 accidental cleaner ingestion Past Medical History:  Diagnosis Date   Acute asthma exacerbation 03/07/2014   Arthritis    Asthma    COVID 04/25/2023   DKA (diabetic ketoacidoses) 03/07/2014   DM (diabetes mellitus) (HCC) 04/24/2013   Dysmenorrhea 01/23/2013    Fatty liver disease, nonalcoholic    Hypertension    Leiomyoma of uterus, unspecified 01/23/2013   Leukocytosis    Menorrhagia with regular cycle 01/23/2013   Obesity    PAF (paroxysmal atrial fibrillation) (HCC)    a. Dx 03/2013, spont converted to NSR.   Sickle cell trait (HCC)    Smoking 03/18/2014    Past Surgical History:  Procedure Laterality Date   HERNIA REPAIR      Family History  Problem Relation Age of Onset   Heart disease Mother    Diabetes Mother    Sickle cell anemia Mother    Cancer Father        colon    Social History   Socioeconomic History   Marital status: Legally Separated    Spouse name: Not on file   Number of children: Not on file   Years of education: Not on file   Highest education level: Not on file  Occupational History   Not on file  Tobacco Use   Smoking status: Every Day    Current packs/day: 0.25    Types: Cigarettes   Smokeless tobacco: Never  Vaping Use   Vaping status: Never Used  Substance and Sexual Activity   Alcohol use: No   Drug use: No   Sexual activity: Yes  Other Topics Concern   Not on file  Social History Narrative   Not on file   Social Drivers of Health   Financial Resource Strain: Not on file  Food Insecurity: Not on file  Transportation Needs: Not on file  Physical Activity: Not on file  Stress: Not on file  Social Connections: Not on file  Intimate Partner Violence: Not on file    Outpatient Medications Prior to Visit  Medication Sig Dispense Refill   Accu-Chek Softclix Lancets lancets Use to check blood sugar twice daily. (Patient not taking: Reported on 05/21/2023) 100 each 2   albuterol  (PROVENTIL  HFA) 108 (90 Base) MCG/ACT inhaler Inhale 2 puffs into the lungs every 6 (six) hours as needed for wheezing or shortness of breath. (Patient not taking: Reported on 05/21/2023) 18 g 1   aluminum-magnesium  hydroxide-simethicone  (MAALOX) 200-200-20 MG/5ML SUSP Take 30 mLs by mouth 4 (four) times daily -   before meals and at bedtime. 500 mL 0   aspirin  EC 81 MG tablet Take 1 tablet (81 mg total) by mouth daily. Swallow whole. (Patient not taking: Reported on 05/21/2023) 100 tablet 1   atorvastatin  (LIPITOR) 40 MG tablet Take 1 tablet (40 mg total) by mouth daily. 90 tablet 2   Blood Glucose Monitoring Suppl (ACCU-CHEK GUIDE) w/Device KIT Testing twice daily. 1 kit 0   diltiazem  (CARDIZEM  CD) 120 MG 24 hr capsule Take 1 capsule (120 mg total) by mouth daily. 180 capsule 2   empagliflozin  (JARDIANCE ) 10 MG TABS tablet Take 1 tablet (10 mg total) by mouth daily before breakfast. 60 tablet 2   glucose blood (TRUE METRIX BLOOD GLUCOSE TEST) test strip Use as instructed (Patient not taking:  Reported on 05/21/2023) 100 each 1   insulin  glargine (LANTUS  SOLOSTAR) 100 UNIT/ML Solostar Pen Inject 45 Units into the skin daily. 45 mL 2   Insulin  Pen Needle (TRUEPLUS 5-BEVEL PEN NEEDLES) 32G X 4 MM MISC Use to inject insulin  once daily. 100 each 1   meclizine  (ANTIVERT ) 25 MG tablet Take 1 tablet (25 mg total) by mouth 2 (two) times daily as needed for dizziness. 20 tablet 0   metFORMIN  (GLUCOPHAGE -XR) 500 MG 24 hr tablet Take 2 tablets (1,000 mg total) by mouth 2 (two) times daily. 360 tablet 1   ondansetron  (ZOFRAN -ODT) 4 MG disintegrating tablet Take 1 tablet (4 mg total) by mouth every 8 (eight) hours as needed for nausea. 10 tablet 0   No facility-administered medications prior to visit.    Allergies  Allergen Reactions   Metoprolol  Other (See Comments)    "Patient slept for whole day after starting medication this week.  Patient does not want to take this med.  Not sure if med causes hypotension, but could not get out of bed for any reason"   Other Other (See Comments)    ANESTHESIA-CAUSES CARDIAC ARREST AND LUNGS COLLAPSED IN 1988, 2008.     Latex Itching    Swell up    ROS Review of Systems  Constitutional:  Negative for fatigue.  HENT: Negative.  Negative for ear pain, postnasal drip,  rhinorrhea, sinus pressure, sore throat, trouble swallowing and voice change.   Eyes: Negative.   Respiratory: Negative.  Negative for apnea, cough, choking, chest tightness, shortness of breath, wheezing and stridor.   Cardiovascular: Negative.  Negative for chest pain, palpitations and leg swelling.  Gastrointestinal:  Negative for abdominal distention, anal bleeding, blood in stool, constipation, diarrhea, nausea and vomiting.  Endocrine: Negative for polydipsia, polyphagia and polyuria.  Genitourinary:  Negative for enuresis.  Musculoskeletal:  Positive for gait problem and joint swelling. Negative for arthralgias and myalgias.  Skin: Negative.  Negative for rash.  Allergic/Immunologic: Negative.  Negative for environmental allergies and food allergies.  Neurological:  Positive for numbness. Negative for dizziness, tremors, seizures, syncope, facial asymmetry, speech difficulty, weakness, light-headedness and headaches.  Hematological: Negative.  Negative for adenopathy. Does not bruise/bleed easily.  Psychiatric/Behavioral: Negative.  Negative for agitation and sleep disturbance. The patient is not nervous/anxious.       Objective:     There were no vitals filed for this visit.   Gen: Pleasant, well-nourished, in no distress,  normal affect  ENT: No lesions,  mouth clear,  oropharynx clear, no postnasal drip  Neck: No JVD, no TMG, no carotid bruits  Lungs: No use of accessory muscles, no dullness to percussion, clear without rales or rhonchi  Cardiovascular: RRR, heart sounds normal, no murmur or gallops, no peripheral edema  Abdomen: soft and NT, no HSM,  BS normal  Musculoskeletal: No deformities, no cyanosis or clubbing  Neuro: alert, non focal  Skin: Warm, no lesions or rashes  No results found.  LMP 12/20/2014  Wt Readings from Last 3 Encounters:  01/19/24 182 lb (82.6 kg)  01/14/24 182 lb 15.7 oz (83 kg)  11/07/23 179 lb (81.2 kg)     Health Maintenance  Due  Topic Date Due   OPHTHALMOLOGY EXAM  Never done   DTaP/Tdap/Td (1 - Tdap) Never done   Zoster Vaccines- Shingrix (1 of 2) Never done   MAMMOGRAM  Never done   Cervical Cancer Screening (HPV/Pap Cotest)  01/24/2016   COVID-19 Vaccine (3 - Moderna risk series)  06/18/2020   Pneumococcal Vaccine 55-62 Years old (2 of 2 - PCV) 08/01/2021   COLON CANCER SCREENING ANNUAL FOBT  08/01/2021   HEMOGLOBIN A1C  11/18/2023   Diabetic kidney evaluation - Urine ACR  02/14/2024    There are no preventive care reminders to display for this patient.  Lab Results  Component Value Date   TSH 1.640 11/07/2021   Lab Results  Component Value Date   WBC 8.6 01/14/2024   HGB 12.8 01/14/2024   HCT 37.5 01/14/2024   MCV 87.6 01/14/2024   PLT 224 01/14/2024   Lab Results  Component Value Date   NA 139 01/14/2024   K 3.9 01/14/2024   CO2 25 01/14/2024   GLUCOSE 318 (H) 01/14/2024   BUN 10 01/14/2024   CREATININE 0.76 01/14/2024   BILITOT 0.3 01/14/2024   ALKPHOS 36 (L) 01/14/2024   AST 16 01/14/2024   ALT 16 01/14/2024   PROT 6.9 01/14/2024   ALBUMIN 3.5 01/14/2024   CALCIUM  9.2 01/14/2024   ANIONGAP 9 01/14/2024   EGFR 92 11/07/2021   Lab Results  Component Value Date   CHOL 126 02/13/2023   Lab Results  Component Value Date   HDL 60 02/13/2023   Lab Results  Component Value Date   LDLCALC 42 02/13/2023   Lab Results  Component Value Date   TRIG 145 02/13/2023   Lab Results  Component Value Date   CHOLHDL 2.1 02/13/2023   Lab Results  Component Value Date   HGBA1C 8.5 (A) 05/21/2023      Assessment & Plan:   Problem List Items Addressed This Visit   None    No orders of the defined types were placed in this encounter. Follow-up: No follow-ups on file.  30 minutes needed for multisystem assessments  Arlene Lacy, MD

## 2024-01-20 NOTE — Discharge Instructions (Signed)
Take the prescribed medication as directed. °Follow-up with your primary care doctor. °Return to the ED for new or worsening symptoms. °

## 2024-01-21 ENCOUNTER — Other Ambulatory Visit: Payer: Self-pay

## 2024-01-23 ENCOUNTER — Telehealth: Payer: Self-pay

## 2024-01-23 ENCOUNTER — Ambulatory Visit: Admitting: Critical Care Medicine

## 2024-01-23 NOTE — Telephone Encounter (Signed)
 Copied from CRM [id number]. Topic: Appointments - Appointment Cancel/Reschedule >> Jan 23, 2024  9:54 AM Star [place] wrote: ED follow up from accidental ingestiong of cleaning product- also continuous swelling from thighs to toes-  please call [phone]

## 2024-01-23 NOTE — Telephone Encounter (Signed)
 Call placed to patient unable to reach message left on VM.  Call to schedule patient OV, MU visit of VV as follow-up to ED Visit. If patient calls back please schedule visit per patient's availability.

## 2024-01-31 ENCOUNTER — Other Ambulatory Visit: Payer: Self-pay

## 2024-02-04 ENCOUNTER — Ambulatory Visit: Attending: Nurse Practitioner | Admitting: Nurse Practitioner

## 2024-02-04 ENCOUNTER — Other Ambulatory Visit: Payer: Self-pay

## 2024-02-04 ENCOUNTER — Encounter: Payer: Self-pay | Admitting: Nurse Practitioner

## 2024-02-04 VITALS — BP 153/83 | HR 95 | Ht 63.0 in | Wt 191.4 lb

## 2024-02-04 DIAGNOSIS — E119 Type 2 diabetes mellitus without complications: Secondary | ICD-10-CM | POA: Diagnosis not present

## 2024-02-04 DIAGNOSIS — F17200 Nicotine dependence, unspecified, uncomplicated: Secondary | ICD-10-CM

## 2024-02-04 DIAGNOSIS — Z794 Long term (current) use of insulin: Secondary | ICD-10-CM | POA: Diagnosis not present

## 2024-02-04 DIAGNOSIS — E782 Mixed hyperlipidemia: Secondary | ICD-10-CM

## 2024-02-04 DIAGNOSIS — M7989 Other specified soft tissue disorders: Secondary | ICD-10-CM

## 2024-02-04 DIAGNOSIS — Z1231 Encounter for screening mammogram for malignant neoplasm of breast: Secondary | ICD-10-CM | POA: Diagnosis not present

## 2024-02-04 DIAGNOSIS — I10 Essential (primary) hypertension: Secondary | ICD-10-CM

## 2024-02-04 LAB — POCT GLYCOSYLATED HEMOGLOBIN (HGB A1C): HbA1c, POC (controlled diabetic range): 12 % — AB (ref 0.0–7.0)

## 2024-02-04 MED ORDER — LANTUS SOLOSTAR 100 UNIT/ML ~~LOC~~ SOPN: 45.0000 [IU] | PEN_INJECTOR | Freq: Every day | SUBCUTANEOUS | 2 refills | Status: AC

## 2024-02-04 MED ORDER — ACCU-CHEK SOFTCLIX LANCETS MISC: 2 refills | Status: AC

## 2024-02-04 MED ORDER — ATORVASTATIN CALCIUM 40 MG PO TABS: 40.0000 mg | ORAL_TABLET | Freq: Every day | ORAL | 2 refills | Status: AC

## 2024-02-04 MED ORDER — NICOTINE 21 MG/24HR TD PT24
21.0000 mg | MEDICATED_PATCH | Freq: Every day | TRANSDERMAL | 0 refills | Status: AC
  Filled 2024-02-04: qty 28, 28d supply, fill #0

## 2024-02-04 MED ORDER — EMPAGLIFLOZIN 25 MG PO TABS
25.0000 mg | ORAL_TABLET | Freq: Every day | ORAL | 1 refills | Status: AC
  Filled 2024-02-04: qty 90, 90d supply, fill #0

## 2024-02-04 MED ORDER — TRUE METRIX BLOOD GLUCOSE TEST VI STRP: ORAL_STRIP | 1 refills | Status: AC

## 2024-02-04 MED ORDER — METFORMIN HCL ER 500 MG PO TB24
1000.0000 mg | ORAL_TABLET | Freq: Two times a day (BID) | ORAL | 1 refills | Status: AC
  Filled 2024-02-04: qty 360, 90d supply, fill #0

## 2024-02-04 MED ORDER — DILTIAZEM HCL ER COATED BEADS 120 MG PO CP24: 120.0000 mg | ORAL_CAPSULE | Freq: Every day | ORAL | 2 refills | Status: AC

## 2024-02-04 NOTE — Progress Notes (Signed)
 Assessment & Plan:  Anna Golden was seen today for medical management of chronic issues.  Diagnoses and all orders for this visit:  Type 2 diabetes mellitus with insulin  therapy (HCC) -     POCT glycosylated hemoglobin (Hb A1C) -     empagliflozin  (JARDIANCE ) 25 MG TABS tablet; Take 1 tablet (25 mg total) by mouth daily before breakfast. -     insulin  glargine (LANTUS  SOLOSTAR) 100 UNIT/ML Solostar Pen; Inject 45 Units into the skin daily. -     metFORMIN  (GLUCOPHAGE -XR) 500 MG 24 hr tablet; Take 2 tablets (1,000 mg total) by mouth 2 (two) times daily. -     Accu-Chek Softclix Lancets lancets; Use to check blood sugar twice daily. -     glucose blood (TRUE METRIX BLOOD GLUCOSE TEST) test strip; Use as instructed. Check blood glucose level by fingerstick twice  per day.  E11.65  Mixed hyperlipidemia -     atorvastatin  (LIPITOR) 40 MG tablet; Take 1 tablet (40 mg total) by mouth daily. INSTRUCTIONS: Work on a low fat, heart healthy diet and participate in regular aerobic exercise program by working out at least 150 minutes per week; 5 days a week-30 minutes per day. Avoid red meat/beef/steak,  fried foods. junk foods, sodas, sugary drinks, unhealthy snacking, alcohol and smoking.  Drink at least 80 oz of water per day and monitor your carbohydrate intake daily.    Breast cancer screening by mammogram -     MM 3D SCREENING MAMMOGRAM BILATERAL BREAST; Future  Tobacco dependence -     nicotine  (NICODERM CQ  - DOSED IN MG/24 HOURS) 21 mg/24hr patch; Place 1 patch (21 mg total) onto the skin daily. -     CT CHEST LUNG CA SCREEN LOW DOSE W/O CM; Future 1. Anna Golden continues to smoke more than 0.5 pack of cigarettes per day. 2. Anna Golden was counseled on the dangers of tobacco use, and was advised to quit. We reviewed specific strategies to maximize success, including removing cigarettes and smoking materials from environment, stress management and support of family/friends as well as pharmacological  alternatives. 3. A total of 5 minutes was spent on counseling for smoking cessation and Anna Golden is ready to quit and has chosen nicotine  patches to start today.  4. Anna Golden was offered Wellbutrin, Chantix, Nicotine  patch, Nicotine  gum or lozenges.  Due to out of pocket costs Anna Golden was also given smoking cessation support and advised to contact: the Smoking Cessation hotline: 1-800-QUIT-NOW.  Anna Golden was also informed of our Smoking cessation classes which are also available through [facility] by calling [phone] or visit our website at [email or web address].  5. Will follow up at next scheduled office visit.    Localized swelling of lower extremity Likely related to poorly controlled diabetes. Most recent BNP was normal.  She does not endorse shortness of breath or chest pain.   Primary hypertension -     diltiazem  (CARDIZEM  CD) 120 MG 24 hr capsule; Take 1 capsule (120 mg total) by mouth daily. Continue all antihypertensives as prescribed.  Reminded to bring in blood pressure log for follow  up appointment.  RECOMMENDATIONS: DASH/Mediterranean Diets are healthier choices for HTN.      Patient has been counseled on age-appropriate routine health concerns for screening and prevention. These are reviewed and up-to-date. Referrals have been placed accordingly. Immunizations are up-to-date or declined.    Subjective:   Chief Complaint  Patient presents with   Medical Management of Chronic Issues    Bilateral  leg swelling    Anna Golden 63 y.o. female presents to office today for follow up to BLE swelling  She has a past medical history of Arthritis, Asthma, Diabetes mellitus, Fatty liver disease, nonalcoholic, Hypertension, Hypokalemia, Leukocytosis, Obesity, PAF (paroxysmal atrial fibrillation with spontaneous conversion to NSR 03-2013), Poor social situation, and Sickle cell trait.    Blood pressure is elevated today. Based on her profile, diltiazem  has  not been picked up recently.  BP Readings from Last 3 Encounters:  02/04/24 (!) 153/83  01/20/24 135/69  01/14/24 125/71    She has been experiencing BLE swelling for several weeks. Denies high sodium intake. Has not been checking her blood sugars. A1c is 12 today. She has not been taking her diabetes medications (lantus , metformin , jardiance ) as prescribed Lab Results  Component Value Date   HGBA1C 12.0 (A) 02/04/2024    Lab Results  Component Value Date   HGBA1C 8.5 (A) 05/21/2023    Review of Systems  Constitutional:  Negative for fever, malaise/fatigue and weight loss.  HENT: Negative.  Negative for nosebleeds.   Eyes: Negative.  Negative for blurred vision, double vision and photophobia.  Respiratory: Negative.  Negative for cough and shortness of breath.   Cardiovascular:  Positive for leg swelling. Negative for chest pain and palpitations.  Gastrointestinal: Negative.  Negative for heartburn, nausea and vomiting.  Musculoskeletal: Negative.  Negative for myalgias.  Neurological: Negative.  Negative for dizziness, focal weakness, seizures and headaches.  Psychiatric/Behavioral: Negative.  Negative for suicidal ideas.     Past Medical History:  Diagnosis Date   Acute asthma exacerbation 03/07/2014   Arthritis    Asthma    COVID 04/25/2023   DKA (diabetic ketoacidoses) 03/07/2014   DM (diabetes mellitus) (HCC) 04/24/2013   Dysmenorrhea 01/23/2013   Fatty liver disease, nonalcoholic    Hypertension    Leiomyoma of uterus, unspecified 01/23/2013   Leukocytosis    Menorrhagia with regular cycle 01/23/2013   Obesity    PAF (paroxysmal atrial fibrillation) (HCC)    a. Dx 03/2013, spont converted to NSR.   Sickle cell trait (HCC)    Smoking 03/18/2014    Past Surgical History:  Procedure Laterality Date   HERNIA REPAIR      Family History  Problem Relation Age of Onset   Heart disease Mother    Diabetes Mother    Sickle cell anemia Mother    Cancer Father         colon    Social History Reviewed with no changes to be made today.   Outpatient Medications Prior to Visit  Medication Sig Dispense Refill   albuterol  (PROVENTIL  HFA) 108 (90 Base) MCG/ACT inhaler Inhale 2 puffs into the lungs every 6 (six) hours as needed for wheezing or shortness of breath. 18 g 1   aluminum -magnesium  hydroxide-simethicone  (MAALOX) 200-200-20 MG/5ML SUSP Take 30 mLs by mouth 4 (four) times daily -  before meals and at bedtime. 500 mL 0   aspirin  EC 81 MG tablet Take 1 tablet (81 mg total) by mouth daily. Swallow whole. 100 tablet 1   Blood Glucose Monitoring Suppl (ACCU-CHEK GUIDE) w/Device KIT Testing twice daily. 1 kit 0   Insulin  Pen Needle (TRUEPLUS 5-BEVEL PEN NEEDLES) 32G X 4 MM MISC Use to inject insulin  once daily. 100 each 1   meclizine  (ANTIVERT ) 25 MG tablet Take 1 tablet (25 mg total) by mouth 2 (two) times daily as needed for dizziness. 20 tablet 0   ondansetron  (ZOFRAN -ODT) 4 MG disintegrating tablet  Take 1 tablet (4 mg total) by mouth every 8 (eight) hours as needed for nausea. 10 tablet 0   Accu-Chek Softclix Lancets lancets Use to check blood sugar twice daily. 100 each 2   atorvastatin  (LIPITOR) 40 MG tablet Take 1 tablet (40 mg total) by mouth daily. 90 tablet 2   diltiazem  (CARDIZEM  CD) 120 MG 24 hr capsule Take 1 capsule (120 mg total) by mouth daily. 180 capsule 2   glucose blood (TRUE METRIX BLOOD GLUCOSE TEST) test strip Use as instructed 100 each 1   insulin  glargine (LANTUS  SOLOSTAR) 100 UNIT/ML Solostar Pen Inject 45 Units into the skin daily. 45 mL 2   metFORMIN  (GLUCOPHAGE -XR) 500 MG 24 hr tablet Take 2 tablets (1,000 mg total) by mouth 2 (two) times daily. 360 tablet 1   empagliflozin  (JARDIANCE ) 10 MG TABS tablet Take 1 tablet (10 mg total) by mouth daily before breakfast. (Patient not taking: Reported on 02/04/2024) 60 tablet 2   No facility-administered medications prior to visit.    Allergies  Allergen Reactions   Metoprolol  Other (See  Comments)    "Patient slept for whole day after starting medication this week.  Patient does not want to take this med.  Not sure if med causes hypotension, but could not get out of bed for any reason"   Other Other (See Comments)    ANESTHESIA-CAUSES CARDIAC ARREST AND LUNGS COLLAPSED IN 1988, 2008.     Latex Itching    Swell up       Objective:    BP (!) 153/83   Pulse 95   Ht 5\' 3"  (1.6 m)   Wt 191 lb 6.4 oz (86.8 kg)   LMP 12/20/2014   SpO2 98%   BMI 33.90 kg/m  Wt Readings from Last 3 Encounters:  02/04/24 191 lb 6.4 oz (86.8 kg)  01/19/24 182 lb (82.6 kg)  01/14/24 182 lb 15.7 oz (83 kg)    Physical Exam Vitals and nursing note reviewed.  Constitutional:      Appearance: She is well-developed.  HENT:     Head: Normocephalic and atraumatic.  Cardiovascular:     Rate and Rhythm: Normal rate and regular rhythm.     Heart sounds: Normal heart sounds. No murmur heard.    No friction rub. No gallop.  Pulmonary:     Effort: Pulmonary effort is normal. No tachypnea or respiratory distress.     Breath sounds: Normal breath sounds. No decreased breath sounds, wheezing, rhonchi or rales.  Chest:     Chest wall: No tenderness.  Abdominal:     General: Bowel sounds are normal.     Palpations: Abdomen is soft.  Musculoskeletal:        General: Normal range of motion.     Cervical back: Normal range of motion.  Skin:    General: Skin is warm and dry.  Neurological:     Mental Status: She is alert and oriented to person, place, and time.     Coordination: Coordination normal.  Psychiatric:        Behavior: Behavior normal. Behavior is cooperative.        Thought Content: Thought content normal.        Judgment: Judgment normal.          Patient has been counseled extensively about nutrition and exercise as well as the importance of adherence with medications and regular follow-up. The patient was given clear instructions to go to ER or return to medical center if  symptoms don't improve, worsen or new problems develop. The patient verbalized understanding.   Follow-up: No follow-ups on file.   Collins Dean, FNP-BC [facility] [place], [place] [phone]   02/04/2024, 5:50 PM

## 2024-02-07 ENCOUNTER — Encounter: Payer: Self-pay | Admitting: Nurse Practitioner

## 2024-02-10 ENCOUNTER — Ambulatory Visit (INDEPENDENT_AMBULATORY_CARE_PROVIDER_SITE_OTHER): Admitting: Podiatry

## 2024-02-10 DIAGNOSIS — Z91199 Patient's noncompliance with other medical treatment and regimen due to unspecified reason: Secondary | ICD-10-CM

## 2024-02-10 NOTE — Progress Notes (Signed)
 No show

## 2024-02-11 ENCOUNTER — Ambulatory Visit
Admission: RE | Admit: 2024-02-11 | Discharge: 2024-02-11 | Disposition: A | Discharge status: Home / Self Care | Source: Ambulatory Visit | Attending: Nurse Practitioner

## 2024-02-11 DIAGNOSIS — F17200 Nicotine dependence, unspecified, uncomplicated: Secondary | ICD-10-CM

## 2024-02-25 ENCOUNTER — Ambulatory Visit
Admission: RE | Admit: 2024-02-25 | Discharge: 2024-02-25 | Disposition: A | Discharge status: Home / Self Care | Source: Ambulatory Visit | Attending: Nurse Practitioner | Admitting: Nurse Practitioner

## 2024-02-25 DIAGNOSIS — Z1231 Encounter for screening mammogram for malignant neoplasm of breast: Secondary | ICD-10-CM

## 2024-02-27 ENCOUNTER — Ambulatory Visit: Payer: Self-pay | Admitting: Nurse Practitioner

## 2024-03-02 ENCOUNTER — Other Ambulatory Visit: Payer: Self-pay | Admitting: Nurse Practitioner

## 2024-03-02 DIAGNOSIS — R928 Other abnormal and inconclusive findings on diagnostic imaging of breast: Secondary | ICD-10-CM

## 2024-03-20 ENCOUNTER — Ambulatory Visit

## 2024-03-20 ENCOUNTER — Ambulatory Visit
Admission: RE | Admit: 2024-03-20 | Discharge: 2024-03-20 | Disposition: A | Discharge status: Home / Self Care | Source: Ambulatory Visit | Attending: Nurse Practitioner | Admitting: Nurse Practitioner

## 2024-03-20 ENCOUNTER — Other Ambulatory Visit: Payer: Self-pay | Admitting: Nurse Practitioner

## 2024-03-20 DIAGNOSIS — N6320 Unspecified lump in the left breast, unspecified quadrant: Secondary | ICD-10-CM

## 2024-03-20 DIAGNOSIS — R928 Other abnormal and inconclusive findings on diagnostic imaging of breast: Secondary | ICD-10-CM

## 2024-03-23 ENCOUNTER — Ambulatory Visit: Payer: Self-pay | Admitting: Nurse Practitioner

## 2024-03-23 ENCOUNTER — Ambulatory Visit (INDEPENDENT_AMBULATORY_CARE_PROVIDER_SITE_OTHER): Admitting: Podiatry

## 2024-03-23 ENCOUNTER — Encounter: Payer: Self-pay | Admitting: Podiatry

## 2024-03-23 DIAGNOSIS — B351 Tinea unguium: Secondary | ICD-10-CM

## 2024-03-23 DIAGNOSIS — M79675 Pain in left toe(s): Secondary | ICD-10-CM | POA: Diagnosis not present

## 2024-03-23 DIAGNOSIS — M79674 Pain in right toe(s): Secondary | ICD-10-CM | POA: Diagnosis not present

## 2024-03-23 DIAGNOSIS — E1142 Type 2 diabetes mellitus with diabetic polyneuropathy: Secondary | ICD-10-CM | POA: Diagnosis not present

## 2024-03-23 NOTE — Progress Notes (Signed)
  Subjective:  Patient ID: Anna Golden, female    DOB: 03/18/1961,   MRN: [id number]  Chief Complaint  Patient presents with   Diabetes    Toenails - Saw Haze Servant, NP - 02/04/2024; A1c - 12.0    63 y.o. female presents for concern of thickened elongated and painful nails that are difficult to trim. Requesting to have them trimmed today. Relates burning and tingling in their feet. Patient is diabetic and last A1c was  Lab Results  Component Value Date   HGBA1C 12.0 (A) 02/04/2024   .   PCP:  Servant Haze ORN, NP    . Denies any other pedal complaints. Denies n/v/f/c.   Past Medical History:  Diagnosis Date   Acute asthma exacerbation 03/07/2014   Arthritis    Asthma    COVID 04/25/2023   DKA (diabetic ketoacidoses) 03/07/2014   DM (diabetes mellitus) (HCC) 04/24/2013   Dysmenorrhea 01/23/2013   Fatty liver disease, nonalcoholic    Hypertension    Leiomyoma of uterus, unspecified 01/23/2013   Leukocytosis    Menorrhagia with regular cycle 01/23/2013   Obesity    PAF (paroxysmal atrial fibrillation) (HCC)    a. Dx 03/2013, spont converted to NSR.   Sickle cell trait (HCC)    Smoking 03/18/2014    Objective:  Physical Exam: Vascular: DP/PT pulses 2/4 bilateral. CFT <3 seconds. Absent hair growth on digits. Edema noted to bilateral lower extremities. Xerosis noted bilaterally.  Skin. No lacerations or abrasions bilateral feet. Nails 1-5 bilateral  are thickened discolored and elongated with subungual debris.  Musculoskeletal: MMT 5/5 bilateral lower extremities in DF, PF, Inversion and Eversion. Deceased ROM in DF of ankle joint. Hammered digits bilateral  Neurological: Sensation intact to light touch. Protective sensation diminished bilateral.    Assessment:   1. Pain due to onychomycosis of toenails of both feet   2. Poorly controlled type 2 diabetes mellitus with peripheral neuropathy (HCC)      Plan:  Patient was evaluated and treated and all questions  answered. -Discussed and educated patient on diabetic foot care, especially with  regards to the vascular, neurological and musculoskeletal systems.  -Stressed the importance of good glycemic control and the detriment of not  controlling glucose levels in relation to the foot. -Discussed supportive shoes at all times and checking feet regularly.  -Mechanically debrided all nails 1-5 bilateral using sterile nail nipper and filed with dremel without incident  -Answered all patient questions -Patient to return  in 3 months for at risk foot care -Patient advised to call the office if any problems or questions arise in the meantime.   Asberry Failing, DPM

## 2024-04-18 NOTE — Progress Notes (Unsigned)
 Cardiology Office Note:   Date:  04/18/2024  ID:  Anna Golden, DOB 06/23/1961, MRN [id number] PCP:  Theotis Haze ORN, NP  [facility] Providers Cardiologist:  Wendel Haws, MD Referring MD: Brien Belvie BRAVO, MD  Chief Complaint/Reason for Referral: Atrial fibrillation ASSESSMENT:    1. PAF (paroxysmal atrial fibrillation) (HCC)   2. Diastolic dysfunction   3. Type 2 diabetes mellitus without complication, with long-term current use of insulin  (HCC)   4. Hyperlipidemia associated with type 2 diabetes mellitus (HCC)   5. Aortic atherosclerosis (HCC)   6. BMI 33.0-33.9,adult     PLAN:   In order of problems listed above: PAF: Start Eliquis 5 mg twice daily, obtain echocardiogram to evaluate further. Diastolic dysfunction: Continue Jardiance  25 mg daily, continue Cardizem  as patient intolerant of Toprol , obtain echocardiogram, ARB?*** T2DM: Continue aspirin  81 mg, atorvastatin  40 mg, Jardiance  25 mg; ARB?*** Hyperlipidemia: Continue atorvastatin  40 mg; check lipid panel, LFTs, LP(a) today*** Aortic atherosclerosis: Continue aspirin  81 mg, atorvastatin  40 mg Elevated BMI: Refer to Pharm.D. for recommendation regarding GLP-1 receptor agonist therapy***      {Are you ordering a CV Procedure (e.g. stress test, cath, DCCV, TEE, etc)?   Press F2        :[id number]}   Dispo:  No follow-ups on file.       I spent *** minutes reviewing all clinical data during and prior to this visit including all relevant imaging studies, laboratories, clinical information from other health systems and prior notes from both Cardiology and other specialties, interviewing the patient, conducting a complete physical examination, and coordinating care in order to formulate a comprehensive and personalized evaluation and treatment plan.   History of Present Illness:    FOCUSED PROBLEM LIST:   Paroxysmal atrial fibrillation Diagnosed 2014>> discharged on Xarelto  Patient could not afford Xarelto   CV 2  score 3 Diastolic dysfunction G2 DD, no significant valve issues, EF 60 to 65% TTE 2014 Intolerant of metoprolol  T2DM On insulin  Hyperlipidemia Aortic atherosclerosis Chest CT 2025 BMI 33  September 2025:  Patient consents to use of AI scribe. The patient is a 63 year old female with the above listed medical problems here to establish cardiovascular care.  She first came to the attention of cardiology in 2014.  She was admitted and found to be in atrial fibrillation with rapid ventricular rate.  Her echocardiogram at that time demonstrated preserved ejection fraction with grade 2 diastolic dysfunction.  A Lexiscan  stress test showed no significant ischemia.  Due to a CV 2 score of 3 she was started on Xarelto .  She was seen in follow-up however she was no longer taking Xarelto  due to its expense.       Current Medications: No outpatient medications have been marked as taking for the 04/30/24 encounter (Office Visit) with Danielle Mink K, MD.     Review of Systems:   Please see the history of present illness.    All other systems reviewed and are negative.     EKGs/Labs/Other Test Reviewed:   EKG: 2025 sinus rhythm, anterior infarction pattern  EKG Interpretation Date/Time:    Ventricular Rate:    PR Interval:    QRS Duration:    QT Interval:    QTC Calculation:   R Axis:      Text Interpretation:          CARDIAC STUDIES: Refer to CV Procedures and Imaging Tabs   Risk Assessment/Calculations:    CHA2DS2-VASc Score = 3  {Click here to calculate score.  REFRESH note before signing. :1} This indicates a 3.2% annual risk of stroke. The patient's score is based upon: CHF History: 0 HTN History: 1 Diabetes History: 1 Stroke History: 0 Vascular Disease History: 0 Age Score: 0 Gender Score: 1   {This patient has a significant risk of stroke if diagnosed with atrial fibrillation.  Please consider VKA or DOAC agent for anticoagulation if the bleeding risk is  acceptable.   You can also use the SmartPhrase .[email or web address] for documentation.   :[id number]}      Physical Exam:   VS:  LMP 12/20/2014    No BP recorded.  {Refresh Note OR Click here to enter BP  :1}***   Wt Readings from Last 3 Encounters:  02/04/24 191 lb 6.4 oz (86.8 kg)  01/19/24 182 lb (82.6 kg)  01/14/24 182 lb 15.7 oz (83 kg)      GENERAL:  No apparent distress, AOx3 HEENT:  No carotid bruits, +2 carotid impulses, no scleral icterus CAR: RRR Irregular RR*** no murmurs***, gallops, rubs, or thrills RES:  Clear to auscultation bilaterally ABD:  Soft, nontender, nondistended, positive bowel sounds x 4 VASC:  +2 radial pulses, +2 carotid pulses NEURO:  CN 2-12 grossly intact; motor and sensory grossly intact PSYCH:  No active depression or anxiety EXT:  No edema, ecchymosis, or cyanosis  Signed, Adeena Bernabe K Franz Svec, MD  04/18/2024 5:07 PM    [facility] [address] [place], [place], [place]  [address] Phone: [phone]; Fax: [phone]   Note:  This document was prepared using Dragon voice recognition software and may include unintentional dictation errors.

## 2024-04-24 ENCOUNTER — Telehealth: Payer: Self-pay

## 2024-04-24 DIAGNOSIS — I10 Essential (primary) hypertension: Secondary | ICD-10-CM

## 2024-04-24 NOTE — Progress Notes (Signed)
 Complex Care Management Note Care Guide Note  04/24/2024 Name: Anyely Cunning MRN: [id number] DOB: [birth year]   Complex Care Management Outreach Attempts: An unsuccessful telephone outreach was attempted today to offer the patient information about available complex care management services.  Follow Up Plan:  Additional outreach attempts will be made to offer the patient complex care management information and services.   Encounter Outcome:  No Answer  Jeoffrey Buffalo , RMA     [place]  [facility], [facility] Guide  Direct Dial : [phone]  Website: [email or web address]

## 2024-04-29 NOTE — Progress Notes (Signed)
 Complex Care Management Note Care Guide Note  04/29/2024 Name: Anna Golden MRN: [id number] DOB: 1961-01-09   Complex Care Management Outreach Attempts: A second unsuccessful outreach was attempted today to offer the patient with information about available complex care management services.  Follow Up Plan:  Additional outreach attempts will be made to offer the patient complex care management information and services.   Encounter Outcome:  No Answer  Jeoffrey Buffalo , RMA     [place]  [facility], [facility] Guide  Direct Dial : [phone]  Website: [email or web address]

## 2024-04-30 ENCOUNTER — Ambulatory Visit: Attending: Internal Medicine | Admitting: Internal Medicine

## 2024-04-30 DIAGNOSIS — Z6833 Body mass index (BMI) 33.0-33.9, adult: Secondary | ICD-10-CM

## 2024-04-30 DIAGNOSIS — E1169 Type 2 diabetes mellitus with other specified complication: Secondary | ICD-10-CM

## 2024-04-30 DIAGNOSIS — E119 Type 2 diabetes mellitus without complications: Secondary | ICD-10-CM

## 2024-04-30 DIAGNOSIS — I5189 Other ill-defined heart diseases: Secondary | ICD-10-CM

## 2024-04-30 DIAGNOSIS — I480 Paroxysmal atrial fibrillation: Secondary | ICD-10-CM

## 2024-04-30 DIAGNOSIS — I700 Atherosclerosis of aorta: Secondary | ICD-10-CM

## 2024-05-04 ENCOUNTER — Ambulatory Visit: Admitting: Nurse Practitioner

## 2024-05-05 NOTE — Progress Notes (Signed)
 Complex Care Management Note Care Guide Note  05/05/2024 Name: Anna Golden MRN: [id number] DOB: Jan 01, 1961   Complex Care Management Outreach Attempts: A third unsuccessful outreach was attempted today to offer the patient with information about available complex care management services.  Follow Up Plan:  No further outreach attempts will be made at this time. We have been unable to contact the patient to offer or enroll patient in complex care management services.  Encounter Outcome:  No Answer  Jeoffrey Buffalo , RMA     [place]  [facility], [facility] Guide  Direct Dial : [phone]  Website: [email or web address]

## 2024-05-25 ENCOUNTER — Telehealth: Payer: Self-pay

## 2024-05-25 NOTE — Telephone Encounter (Signed)
 Message has been sent to provider and will be reviewed.

## 2024-05-25 NOTE — Telephone Encounter (Signed)
 Patient came in 9/26 requesting for PCP to write her a letter listing all her current health issues for Social Services and for her to continue going to the chiropractor. Patient is requesting for the letter to be sent to the email on file, advised patient it would be on her MyChart but she states she not active on there.

## 2024-05-25 NOTE — Telephone Encounter (Signed)
 Copied from CRM [id number]. Topic: Medical Record Request - Other >> May 25, 2024  3:03 PM Anna Golden wrote:  Reason for CRM: Patient is calling as she has moved to [facility] and is trying to applying for medicaid needling a letter today to include medical diagnois, with trouble walking. Requesting letter email to be sent [email or web address]

## 2024-05-25 NOTE — Telephone Encounter (Addendum)
 Patient is calling as she has moved to [facility] and is trying to applying for medicaid needling a letter today to include medical diagnois, with trouble walking. Requesting letter email to be sent [email or web address]

## 2024-05-25 NOTE — Telephone Encounter (Signed)
Return call to patient unanswered.

## 2024-05-26 ENCOUNTER — Encounter: Payer: Self-pay | Admitting: Nurse Practitioner

## 2024-05-26 NOTE — Telephone Encounter (Signed)
Letter in mychart

## 2024-05-26 NOTE — Telephone Encounter (Signed)
 Patient identified by name and date of birth.  Letter mailed to patient.

## 2024-06-23 ENCOUNTER — Ambulatory Visit: Payer: Self-pay | Admitting: Podiatry

## 2024-09-21 ENCOUNTER — Encounter
